# Patient Record
Sex: Female | Born: 1995 | Race: Black or African American | Hispanic: No | Marital: Single | Smoking: Never smoker
Health system: Southern US, Community
[De-identification: ages and names within clinical notes are randomized; demographics above are authoritative.]

## PROBLEM LIST (undated history)

## (undated) DIAGNOSIS — N879 Dysplasia of cervix uteri, unspecified: Secondary | ICD-10-CM

## (undated) DIAGNOSIS — B999 Unspecified infectious disease: Secondary | ICD-10-CM

## (undated) DIAGNOSIS — D649 Anemia, unspecified: Secondary | ICD-10-CM

## (undated) DIAGNOSIS — F53 Postpartum depression: Secondary | ICD-10-CM

## (undated) DIAGNOSIS — A749 Chlamydial infection, unspecified: Secondary | ICD-10-CM

## (undated) HISTORY — DX: Postpartum depression: F53.0

---
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
## 2013-02-11 ENCOUNTER — Ambulatory Visit: Admitting: Obstetrics and Gynecology

## 2013-05-20 ENCOUNTER — Ambulatory Visit: Admitting: Obstetrics & Gynecology

## 2013-05-26 ENCOUNTER — Encounter: Payer: Self-pay | Admitting: Obstetrics & Gynecology

## 2013-05-26 ENCOUNTER — Ambulatory Visit (INDEPENDENT_AMBULATORY_CARE_PROVIDER_SITE_OTHER): Admitting: Obstetrics & Gynecology

## 2013-05-26 VITALS — BP 94/70 | HR 97 | Ht 61.0 in | Wt 140.0 lb

## 2013-05-26 DIAGNOSIS — Z01419 Encounter for gynecological examination (general) (routine) without abnormal findings: Secondary | ICD-10-CM

## 2013-05-26 DIAGNOSIS — Z0000 Encounter for general adult medical examination without abnormal findings: Secondary | ICD-10-CM

## 2013-05-26 LAB — CBC
HEMATOCRIT: 37.5 % (ref 36.0–46.0)
HEMOGLOBIN: 12.1 g/dL (ref 12.0–15.0)
MCH: 23.4 pg — ABNORMAL LOW (ref 26.0–34.0)
MCHC: 32.3 g/dL (ref 30.0–36.0)
MCV: 72.4 fL — ABNORMAL LOW (ref 78.0–100.0)
Platelets: 338 10*3/uL (ref 150–400)
RBC: 5.18 MIL/uL — ABNORMAL HIGH (ref 3.87–5.11)
RDW: 17.7 % — ABNORMAL HIGH (ref 11.5–15.5)
WBC: 7.3 10*3/uL (ref 4.0–10.5)

## 2013-05-26 LAB — TSH: TSH: 1.357 u[IU]/mL (ref 0.350–4.500)

## 2013-05-26 NOTE — Progress Notes (Signed)
Subjective:    Shirley Greene is a 18 y.o. female who presents for an annual exam. The patient has no complaints today. The patient is sexually active. GYN screening history: last pap: patient has never had a pap test. The patient wears seatbelts: yes. The patient participates in regular exercise: no. Has the patient ever been transfused or tattooed?: yes. (tattoo) The patient reports that there is not domestic violence in her life.   Menstrual History: OB History   Grav Para Term Preterm Abortions TAB SAB Ect Mult Living   1 1 1       1       Menarche age: 89  Patient's last menstrual period was 05/16/2013.    The following portions of the patient's history were reviewed and updated as appropriate: allergies, current medications, past family history, past medical history, past social history, past surgical history and problem list.  Review of Systems A comprehensive review of systems was negative. 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 Monogamous for 7 months, denies dyspareunia. She uses Nexplanon. She has a period about every 4-6 months, lasting 3-4 weeks.   Objective:    BP 94/70  Pulse 97  Ht 5\' 1"  (1.549 m)  Wt 140 lb (63.504 kg)  BMI 26.47 kg/m2  LMP 05/16/2013  General Appearance:    Alert, cooperative, no distress, appears stated age  Head:    Normocephalic, without obvious abnormality, atraumatic  Eyes:    PERRL, conjunctiva/corneas clear, EOM's intact, fundi    benign, both eyes  Ears:    Normal TM's and external ear canals, both ears  Nose:   Nares normal, septum midline, mucosa normal, no drainage    or sinus tenderness  Throat:   Lips, mucosa, and tongue normal; teeth and gums normal  Neck:   Supple, symmetrical, trachea midline, no adenopathy;    thyroid:  no enlargement/tenderness/nodules; no carotid   bruit or JVD  Back:     Symmetric, no curvature, ROM normal, no CVA tenderness  Lungs:     Clear to auscultation bilaterally, respirations unlabored   Chest Wall:    No tenderness or deformity   Heart:    Regular rate and rhythm, S1 and S2 normal, no murmur, rub   or gallop  Breast Exam:    No tenderness, masses, or nipple abnormality  Abdomen:     Soft, non-tender, bowel sounds active all four quadrants,    no masses, no organomegaly  Genitalia:    Normal female without lesion, discharge or tenderness, NSSA, NT, mobile, normal adnexal exam     Extremities:   Extremities normal, atraumatic, no cyanosis or edema  Pulses:   2+ and symmetric all extremities  Skin:   Skin color, texture, turgor normal, no rashes or lesions  Lymph nodes:   Cervical, supraclavicular, and axillary nodes normal  Neurologic:   CNII-XII intact, normal strength, sensation and reflexes    throughout  .    Assessment:    Healthy female exam.  H/O anemia   Plan:     Breast self exam technique reviewed and patient encouraged to perform self-exam monthly. Chlamydia specimen. GC specimen.  CBC to follow anemia

## 2013-07-07 ENCOUNTER — Telehealth: Payer: Self-pay | Admitting: *Deleted

## 2013-07-07 DIAGNOSIS — N926 Irregular menstruation, unspecified: Secondary | ICD-10-CM

## 2013-07-07 MED ORDER — NORGESTIMATE-ETH ESTRADIOL 0.25-35 MG-MCG PO TABS: 1.0000 | ORAL_TABLET | Freq: Every day | ORAL | Status: DC

## 2013-07-07 NOTE — Telephone Encounter (Signed)
Patient is having irregular bleeding with the nextplanon and would like to go on the pill for three months as discussed at her last exam to try and get her bleeding in order.  She will do this and call us back at the end.

## 2013-11-17 ENCOUNTER — Encounter: Payer: Self-pay | Admitting: Obstetrics & Gynecology

## 2013-12-18 ENCOUNTER — Other Ambulatory Visit: Payer: Self-pay | Admitting: Family Medicine

## 2013-12-18 ENCOUNTER — Encounter: Payer: Self-pay | Admitting: Family Medicine

## 2013-12-18 ENCOUNTER — Ambulatory Visit (INDEPENDENT_AMBULATORY_CARE_PROVIDER_SITE_OTHER): Admitting: Family Medicine

## 2013-12-18 VITALS — BP 118/62 | HR 79 | Wt 140.0 lb

## 2013-12-18 DIAGNOSIS — Z113 Encounter for screening for infections with a predominantly sexual mode of transmission: Secondary | ICD-10-CM

## 2013-12-18 DIAGNOSIS — N898 Other specified noninflammatory disorders of vagina: Secondary | ICD-10-CM

## 2013-12-18 MED ORDER — METRONIDAZOLE 500 MG PO TABS: 500.0000 mg | ORAL_TABLET | Freq: Two times a day (BID) | ORAL | Status: DC

## 2013-12-18 NOTE — Patient Instructions (Signed)
Bacterial Vaginosis Bacterial vaginosis is a vaginal infection that occurs when the normal balance of bacteria in the vagina is disrupted. It results from an overgrowth of certain bacteria. This is the most common vaginal infection in women of childbearing age. Treatment is important to prevent complications, especially in pregnant women, as it can cause a premature delivery. CAUSES  Bacterial vaginosis is caused by an increase in harmful bacteria that are normally present in smaller amounts in the vagina. Several different kinds of bacteria can cause bacterial vaginosis. However, the reason that the condition develops is not fully understood. RISK FACTORS Certain activities or behaviors can put you at an increased risk of developing bacterial vaginosis, including:  Having a new sex partner or multiple sex partners.  Douching.  Using an intrauterine device (IUD) for contraception. Women do not get bacterial vaginosis from toilet seats, bedding, swimming pools, or contact with objects around them. SIGNS AND SYMPTOMS  Some women with bacterial vaginosis have no signs or symptoms. Common symptoms include:  Grey vaginal discharge.  A fishlike odor with discharge, especially after sexual intercourse.  Itching or burning of the vagina and vulva.  Burning or pain with urination. DIAGNOSIS  Your health care provider will take a medical history and examine the vagina for signs of bacterial vaginosis. A sample of vaginal fluid may be taken. Your health care provider will look at this sample under a microscope to check for bacteria and abnormal cells. A vaginal pH test may also be done.  TREATMENT  Bacterial vaginosis may be treated with antibiotic medicines. These may be given in the form of a pill or a vaginal cream. A second round of antibiotics may be prescribed if the condition comes back after treatment.  HOME CARE INSTRUCTIONS   Only take over-the-counter or prescription medicines as  directed by your health care provider.  If antibiotic medicine was prescribed, take it as directed. Make sure you finish it even if you start to feel better.  Do not have sex until treatment is completed.  Tell all sexual partners that you have a vaginal infection. They should see their health care provider and be treated if they have problems, such as a mild rash or itching.  Practice safe sex by using condoms and only having one sex partner. SEEK MEDICAL CARE IF:   Your symptoms are not improving after 3 days of treatment.  You have increased discharge or pain.  You have a fever. MAKE SURE YOU:   Understand these instructions.  Will watch your condition.  Will get help right away if you are not doing well or get worse. FOR MORE INFORMATION  Centers for Disease Control and Prevention, Division of STD Prevention: www.cdc.gov/std American Sexual Health Association (ASHA): www.ashastd.org  Document Released: 01/02/2005 Document Revised: 10/23/2012 Document Reviewed: 08/14/2012 ExitCare Patient Information 2015 [employer]. This information is not intended to replace advice given to you by your health care provider. Make sure you discuss any questions you have with your health care provider.  

## 2013-12-18 NOTE — Progress Notes (Signed)
    Subjective:    Patient ID: Shirley Greene is a 18 y.o. female presenting with Vaginal Discharge and vaginal odor  on 12/18/2013  HPI: Here today for vaginal discharge which is normal but has noted odor--since last cycle left approximately 1 wk..  Last sexually active 1 month ago.  Has Nexplanon. Has some bleeding. No irritations.   Review of Systems  Constitutional: Negative for fever and chills.  Respiratory: Negative for shortness of breath.   Cardiovascular: Negative for chest pain.  Gastrointestinal: Negative for nausea, vomiting and abdominal pain.  Genitourinary: Positive for vaginal discharge. Negative for dysuria, vaginal pain and menstrual problem.  Skin: Negative for rash.      Objective:    BP 118/62 mmHg  Pulse 79  Wt 140 lb (63.504 kg)  LMP 11/21/2013  Breastfeeding? No Physical Exam  Constitutional: She is oriented to person, place, and time. She appears well-developed and well-nourished. No distress.  HENT:  Head: Normocephalic and atraumatic.  Eyes: No scleral icterus.  Neck: Neck supple.  Cardiovascular: Normal rate.   Pulmonary/Chest: Effort normal.  Abdominal: Soft.  Genitourinary: Uterus is not enlarged and not tender. Cervix exhibits no motion tenderness and no friability. Right adnexum displays no mass and no tenderness. Left adnexum displays no mass and no tenderness. Vaginal discharge (thin, white) found.  Neurological: She is alert and oriented to person, place, and time.  Skin: Skin is warm and dry.  Psychiatric: She has a normal mood and affect.        Assessment & Plan:   Problem List Items Addressed This Visit    None    Visit Diagnoses    Screen for STD (sexually transmitted disease)    -  Primary    Relevant Orders       GC/Chlamydia Probe Amp       HIV antibody       RPR    Vaginal discharge        Relevant Medications       metroNIDAZOLE (FLAGYL) tablet    Other Relevant Orders       Wet prep, genital       GC/Chlamydia  Probe Amp        Return if symptoms worsen or fail to improve, for a follow-up.

## 2013-12-19 LAB — WET PREP, GENITAL
Trich, Wet Prep: NONE SEEN
WBC, Wet Prep HPF POC: NONE SEEN
YEAST WET PREP: NONE SEEN

## 2013-12-19 LAB — GC/CHLAMYDIA PROBE AMP
CT PROBE, AMP APTIMA: NEGATIVE
GC PROBE AMP APTIMA: NEGATIVE

## 2013-12-19 LAB — RPR

## 2013-12-19 LAB — HIV ANTIBODY (ROUTINE TESTING W REFLEX): HIV: NONREACTIVE

## 2013-12-26 ENCOUNTER — Emergency Department (HOSPITAL_COMMUNITY)
Admission: EM | Admit: 2013-12-26 | Discharge: 2013-12-26 | Discharge status: Against Medical Advice | Attending: Emergency Medicine | Admitting: Emergency Medicine

## 2013-12-26 ENCOUNTER — Encounter (HOSPITAL_COMMUNITY): Payer: Self-pay | Admitting: Emergency Medicine

## 2013-12-26 DIAGNOSIS — Y9389 Activity, other specified: Secondary | ICD-10-CM | POA: Diagnosis not present

## 2013-12-26 DIAGNOSIS — T656X1A Toxic effect of paints and dyes, not elsewhere classified, accidental (unintentional), initial encounter: Secondary | ICD-10-CM | POA: Diagnosis not present

## 2013-12-26 DIAGNOSIS — Y998 Other external cause status: Secondary | ICD-10-CM | POA: Insufficient documentation

## 2013-12-26 DIAGNOSIS — L299 Pruritus, unspecified: Secondary | ICD-10-CM | POA: Diagnosis not present

## 2013-12-26 DIAGNOSIS — Y9289 Other specified places as the place of occurrence of the external cause: Secondary | ICD-10-CM | POA: Diagnosis not present

## 2013-12-26 DIAGNOSIS — T7840XA Allergy, unspecified, initial encounter: Secondary | ICD-10-CM | POA: Diagnosis present

## 2013-12-26 NOTE — ED Notes (Signed)
Pt. reports allergic reaction to hair dye yesterday with scalp and face itching , no redness or swelling / airway intact , respirations unlabored .

## 2014-01-30 ENCOUNTER — Inpatient Hospital Stay (HOSPITAL_COMMUNITY)
Admission: AD | Admit: 2014-01-30 | Discharge: 2014-01-30 | Disposition: A | Discharge status: Home / Self Care | Source: Ambulatory Visit | Attending: Obstetrics & Gynecology | Admitting: Obstetrics & Gynecology

## 2014-01-30 ENCOUNTER — Encounter (HOSPITAL_COMMUNITY): Payer: Self-pay | Admitting: General Practice

## 2014-01-30 DIAGNOSIS — B9689 Other specified bacterial agents as the cause of diseases classified elsewhere: Secondary | ICD-10-CM

## 2014-01-30 DIAGNOSIS — R319 Hematuria, unspecified: Secondary | ICD-10-CM | POA: Insufficient documentation

## 2014-01-30 DIAGNOSIS — N390 Urinary tract infection, site not specified: Secondary | ICD-10-CM | POA: Diagnosis not present

## 2014-01-30 DIAGNOSIS — N939 Abnormal uterine and vaginal bleeding, unspecified: Secondary | ICD-10-CM | POA: Insufficient documentation

## 2014-01-30 DIAGNOSIS — N760 Acute vaginitis: Secondary | ICD-10-CM | POA: Insufficient documentation

## 2014-01-30 LAB — URINALYSIS, ROUTINE W REFLEX MICROSCOPIC
BILIRUBIN URINE: NEGATIVE
GLUCOSE, UA: NEGATIVE mg/dL
Ketones, ur: 15 mg/dL — AB
Leukocytes, UA: NEGATIVE
NITRITE: POSITIVE — AB
PH: 6 (ref 5.0–8.0)
PROTEIN: NEGATIVE mg/dL
SPECIFIC GRAVITY, URINE: 1.025 (ref 1.005–1.030)
Urobilinogen, UA: 0.2 mg/dL (ref 0.0–1.0)

## 2014-01-30 LAB — CBC
HEMATOCRIT: 41.4 % (ref 36.0–46.0)
HEMOGLOBIN: 13.3 g/dL (ref 12.0–15.0)
MCH: 25 pg — ABNORMAL LOW (ref 26.0–34.0)
MCHC: 32.1 g/dL (ref 30.0–36.0)
MCV: 77.7 fL — ABNORMAL LOW (ref 78.0–100.0)
PLATELETS: 315 10*3/uL (ref 150–400)
RBC: 5.33 MIL/uL — AB (ref 3.87–5.11)
RDW: 14.4 % (ref 11.5–15.5)
WBC: 6.9 10*3/uL (ref 4.0–10.5)

## 2014-01-30 LAB — WET PREP, GENITAL
Trich, Wet Prep: NONE SEEN
YEAST WET PREP: NONE SEEN

## 2014-01-30 LAB — URINE MICROSCOPIC-ADD ON

## 2014-01-30 LAB — POCT PREGNANCY, URINE: PREG TEST UR: NEGATIVE

## 2014-01-30 MED ORDER — METRONIDAZOLE 500 MG PO TABS: 500.0000 mg | ORAL_TABLET | Freq: Two times a day (BID) | ORAL | Status: DC

## 2014-01-30 MED ORDER — CIPROFLOXACIN HCL 500 MG PO TABS: 500.0000 mg | ORAL_TABLET | Freq: Two times a day (BID) | ORAL | Status: DC

## 2014-01-30 NOTE — Discharge Instructions (Signed)
Bacterial Vaginosis Bacterial vaginosis is a vaginal infection that occurs when the normal balance of bacteria in the vagina is disrupted. It results from an overgrowth of certain bacteria. This is the most common vaginal infection in women of childbearing age. Treatment is important to prevent complications, especially in pregnant women, as it can cause a premature delivery. CAUSES  Bacterial vaginosis is caused by an increase in harmful bacteria that are normally present in smaller amounts in the vagina. Several different kinds of bacteria can cause bacterial vaginosis. However, the reason that the condition develops is not fully understood. RISK FACTORS Certain activities or behaviors can put you at an increased risk of developing bacterial vaginosis, including:  Having a new sex partner or multiple sex partners.  Douching.  Using an intrauterine device (IUD) for contraception. Women do not get bacterial vaginosis from toilet seats, bedding, swimming pools, or contact with objects around them. SIGNS AND SYMPTOMS  Some women with bacterial vaginosis have no signs or symptoms. Common symptoms include:  Grey vaginal discharge.  A fishlike odor with discharge, especially after sexual intercourse.  Itching or burning of the vagina and vulva.  Burning or pain with urination. DIAGNOSIS  Your health care provider will take a medical history and examine the vagina for signs of bacterial vaginosis. A sample of vaginal fluid may be taken. Your health care provider will look at this sample under a microscope to check for bacteria and abnormal cells. A vaginal pH test may also be done.  TREATMENT  Bacterial vaginosis may be treated with antibiotic medicines. These may be given in the form of a pill or a vaginal cream. A second round of antibiotics may be prescribed if the condition comes back after treatment.  HOME CARE INSTRUCTIONS   Only take over-the-counter or prescription medicines as  directed by your health care provider.  If antibiotic medicine was prescribed, take it as directed. Make sure you finish it even if you start to feel better.  Do not have sex until treatment is completed.  Tell all sexual partners that you have a vaginal infection. They should see their health care provider and be treated if they have problems, such as a mild rash or itching.  Practice safe sex by using condoms and only having one sex partner. SEEK MEDICAL CARE IF:   Your symptoms are not improving after 3 days of treatment.  You have increased discharge or pain.  You have a fever. MAKE SURE YOU:   Understand these instructions.  Will watch your condition.  Will get help right away if you are not doing well or get worse. FOR MORE INFORMATION  Centers for Disease Control and Prevention, Division of STD Prevention: www.cdc.gov/std American Sexual Health Association (ASHA): www.ashastd.org  Document Released: 01/02/2005 Document Revised: 10/23/2012 Document Reviewed: 08/14/2012 ExitCare Patient Information 2015 Exit[employer]. This information is not intended to replace advice given to you by your health care provider. Make sure you discuss any questions you have with your health care provider.  

## 2014-01-30 NOTE — MAU Note (Signed)
I have had vaginal bleeding for 3wks. Have Nexplanon which was place May 2015. My periods have been more frequent like 2/mo and lasting longer but this is longest. Using 4 pads daily. Mild cramping

## 2014-01-30 NOTE — MAU Provider Note (Signed)
History     CSN: [id number]  Arrival date and time: 01/30/14 1859   First Provider Initiated Contact with Patient 01/30/14 2003      Chief Complaint  Patient presents with  . Vaginal Bleeding   HPI Shirley Greene 18 y.o. G1P1001 nonpregnant female presents to MAU complaining of irregular bleeding with Nexplanon in place.  She has bleeding daily x 3 weeks using 4 pads/day.  She is having intermittent cramping 4/10 and feeling more emotional.  She denies generalized abdominal pain, weakness, dizziness, nausea, vomiting,dysuria.   OB History    Gravida Para Term Preterm AB TAB SAB Ectopic Multiple Living   1 1 1       1       Past Medical History  Diagnosis Date  . Anemia     Past Surgical History  Procedure Laterality Date  . Cesarean section N/A 04/05/2012    Procedure: CESAREAN SECTION;  Surgeon: Tilda BurrowJohn V Ferguson, MD;  Location: [facility];  Service: Obstetrics;  Laterality: N/A;    History reviewed. No pertinent family history.  History  Substance Use Topics  . Smoking status: Never Smoker   . Smokeless tobacco: Never Used  . Alcohol Use: No    Allergies: No Known Allergies  Prescriptions prior to admission  Medication Sig Dispense Refill Last Dose  . metroNIDAZOLE (FLAGYL) 500 MG tablet Take 1 tablet (500 mg total) by mouth 2 (two) times daily. (Patient not taking: Reported on 01/30/2014) 14 tablet 0     ROS Pertinent ROS in HPI  Physical Exam   Blood pressure 112/62, pulse 88, temperature 98.8 F (37.1 C), resp. rate 18, height 5' (1.524 m), weight 140 lb 12.8 oz (63.866 kg), last menstrual period 01/08/2014, SpO2 100 %, not currently breastfeeding.  Physical Exam  Constitutional: She is oriented to person, place, and time. She appears well-developed and well-nourished. No distress.  HENT:  Head: Normocephalic and atraumatic.  Eyes: EOM are normal.  Neck: Normal range of motion.  Cardiovascular: Normal rate and regular rhythm.   Respiratory: Effort normal and  breath sounds normal. No respiratory distress.  GI: Soft. Bowel sounds are normal. She exhibits no distension. There is no tenderness. There is no rebound.  Genitourinary:  Mod amt of dark red blood in vaginal vault.  Malodorous Cervix is closed.  No CMT.  No adnexal mass or tenderness.  Musculoskeletal: Normal range of motion. She exhibits no edema.  Neurological: She is alert and oriented to person, place, and time.  Skin: Skin is warm and dry.  Psychiatric: She has a normal mood and affect.   Results for orders placed or performed during the hospital encounter of 01/30/14 (from the past 24 hour(s))  Urinalysis, Routine w reflex microscopic     Status: Abnormal   Collection Time: 01/30/14  7:40 PM  Result Value Ref Range   Color, Urine YELLOW YELLOW   APPearance CLEAR CLEAR   Specific Gravity, Urine 1.025 1.005 - 1.030   pH 6.0 5.0 - 8.0   Glucose, UA NEGATIVE NEGATIVE mg/dL   Hgb urine dipstick LARGE (A) NEGATIVE   Bilirubin Urine NEGATIVE NEGATIVE   Ketones, ur 15 (A) NEGATIVE mg/dL   Protein, ur NEGATIVE NEGATIVE mg/dL   Urobilinogen, UA 0.2 0.0 - 1.0 mg/dL   Nitrite POSITIVE (A) NEGATIVE   Leukocytes, UA NEGATIVE NEGATIVE  Urine microscopic-add on     Status: Abnormal   Collection Time: 01/30/14  7:40 PM  Result Value Ref Range   Squamous Epithelial / LPF  FEW (A) RARE   WBC, UA 3-6 <3 WBC/hpf   RBC / HPF 7-10 <3 RBC/hpf   Bacteria, UA MANY (A) RARE  Pregnancy, urine POC     Status: None   Collection Time: 01/30/14  7:46 PM  Result Value Ref Range   Preg Test, Ur NEGATIVE NEGATIVE  Wet prep, genital     Status: Abnormal   Collection Time: 01/30/14  8:15 PM  Result Value Ref Range   Yeast Wet Prep HPF POC NONE SEEN NONE SEEN   Trich, Wet Prep NONE SEEN NONE SEEN   Clue Cells Wet Prep HPF POC FEW (A) NONE SEEN   WBC, Wet Prep HPF POC FEW (A) NONE SEEN  CBC     Status: Abnormal   Collection Time: 01/30/14  8:15 PM  Result Value Ref Range   WBC 6.9 4.0 - 10.5 K/uL    RBC 5.33 (H) 3.87 - 5.11 MIL/uL   Hemoglobin 13.3 12.0 - 15.0 g/dL   HCT 29.5 62.1 - 30.8 %   MCV 77.7 (L) 78.0 - 100.0 fL   MCH 25.0 (L) 26.0 - 34.0 pg   MCHC 32.1 30.0 - 36.0 g/dL   RDW 65.7 84.6 - 96.2 %   Platelets 315 150 - 400 K/uL    MAU Course  Procedures  MDM U/A suggests UTI Wet prep and exam are consistent with BV Pt is hemodynamically stable  Assessment and Plan  A:  1. Urinary tract infection with hematuria, site unspecified   2. Bacterial vaginosis   3. Abnormal uterine bleeding    P: Discharge to home Cipro bid x 5 days Flagyl bid x 7 days No etoh/IC x 10 days Pt informed of the possibility of undetected trichomonal infection.  She and/or partner may go to HD for additional eval/tx.   Follow up at [facility] for further bleeding problems Patient may return to MAU as needed or if her condition were to change or worsen   Bertram Denver 01/30/2014, 8:05 PM

## 2014-02-02 LAB — RPR: RPR: NONREACTIVE

## 2014-02-02 LAB — HIV ANTIBODY (ROUTINE TESTING W REFLEX): HIV-1/HIV-2 Ab: NONREACTIVE

## 2014-02-03 LAB — GC/CHLAMYDIA PROBE AMP (~~LOC~~) NOT AT ARMC
Chlamydia: NEGATIVE
NEISSERIA GONORRHEA: NEGATIVE

## 2014-02-04 ENCOUNTER — Ambulatory Visit (INDEPENDENT_AMBULATORY_CARE_PROVIDER_SITE_OTHER): Admitting: Obstetrics and Gynecology

## 2014-02-04 ENCOUNTER — Encounter: Payer: Self-pay | Admitting: Obstetrics and Gynecology

## 2014-02-04 VITALS — BP 93/60 | HR 102 | Wt 140.0 lb

## 2014-02-04 DIAGNOSIS — Z30018 Encounter for initial prescription of other contraceptives: Secondary | ICD-10-CM

## 2014-02-04 DIAGNOSIS — Z3046 Encounter for surveillance of implantable subdermal contraceptive: Secondary | ICD-10-CM

## 2014-02-04 DIAGNOSIS — Z308 Encounter for other contraceptive management: Secondary | ICD-10-CM

## 2014-02-04 NOTE — Progress Notes (Signed)
Patient ID: Shirley Greene, female   DOB: [birth year], 19 y.o.   MRN: [id number] History 19 yo G1P1001 presents for Nexplanon removal. Device inserted here 05/2012 and initially had bleeding only q 3 months. She has had worsening unexpected light bleeding over the past year. She has bled daily since Christmas eve. About 6 months ago, she was given a trial of combination OCPs which she took for 3 months with no improvement. She denies pelvic pain or vaginal discharge. Was dx with BV at 01/30/14 MAU visit treated with Flagyl. No new sex partner. Desires to go back on OrthoEvra.   Physical exam Filed Vitals:   02/04/14 1525  BP: 93/60  Pulse: 102  Gen: WN, WD in NAD Abd: soft NT  Procedure Note Patient given informed consent for removal of her Implanon, time out was performed.  Signed copy in the chart.  Appropriate time out taken. Implanon site identified.  Area prepped in usual sterile fashon. One cc of 1% lidocaine was used to anesthetize the area at the distal end of the implant. A small stab incision was made right beside the implant on the distal portion.  The implanon rod was grasped using hemostats and removed without difficulty.  There was less than 3 cc blood loss. There were no complications.  A small amount of antibiotic ointment and steri-strips were applied over the small incision.  A pressure bandage was applied to reduce any bruising.  The patient tolerated the procedure well and was given post procedure instructions.  Assessment Visit for Nexplanon removal  Plan Rx OrthoEvra Use back up method or abstain for first month.  Return 6 months for Annual GYN visit. Shirley Greene, CNM 02/04/2014 4:06 PM

## 2014-02-09 MED ORDER — NORELGESTROMIN-ETH ESTRADIOL 150-35 MCG/24HR TD PTWK: 1.0000 | MEDICATED_PATCH | TRANSDERMAL | Status: DC

## 2014-02-09 NOTE — Addendum Note (Signed)
Addended by: Barbara CowerNOGUES, Hasson Gaspard L on: 02/09/2014 01:39 PM   Modules accepted: Orders

## 2014-09-30 ENCOUNTER — Ambulatory Visit (INDEPENDENT_AMBULATORY_CARE_PROVIDER_SITE_OTHER): Payer: Self-pay | Admitting: Certified Nurse Midwife

## 2014-09-30 ENCOUNTER — Encounter: Payer: Self-pay | Admitting: Certified Nurse Midwife

## 2014-09-30 VITALS — BP 103/68 | HR 94 | Wt 143.0 lb

## 2014-09-30 DIAGNOSIS — N760 Acute vaginitis: Secondary | ICD-10-CM

## 2014-09-30 NOTE — Progress Notes (Signed)
SUBJECTIVE:  19 y.o. female complains of clear and white vaginal discharge for 2 days . Denies abnormal vaginal bleeding or significant pelvic pain or fever. No UTI symptoms. Denies history of known exposure to STD.  Patient's last menstrual period was 09/21/2014.  OBJECTIVE:  She appears well, afebrile. Abdomen: benign, soft, nontender, no masses. Pelvic Exam: normal external genitalia, vulva, vagina, cervix, uterus and adnexa. Urine dipstick: not done.  ASSESSMENT:  nonspecific vaginitis  PLAN:   Treatment: abstain from coitus during course of treatment and will send wet prep off and call pt in appropriate medicine ROV prn if symptoms persist or worsen.

## 2014-09-30 NOTE — Patient Instructions (Signed)
Bacterial Vaginosis Bacterial vaginosis is a vaginal infection that occurs when the normal balance of bacteria in the vagina is disrupted. It results from an overgrowth of certain bacteria. This is the most common vaginal infection in women of childbearing age. Treatment is important to prevent complications, especially in pregnant women, as it can cause a premature delivery. CAUSES  Bacterial vaginosis is caused by an increase in harmful bacteria that are normally present in smaller amounts in the vagina. Several different kinds of bacteria can cause bacterial vaginosis. However, the reason that the condition develops is not fully understood. RISK FACTORS Certain activities or behaviors can put you at an increased risk of developing bacterial vaginosis, including:  Having a new sex partner or multiple sex partners.  Douching.  Using an intrauterine device (IUD) for contraception. Women do not get bacterial vaginosis from toilet seats, bedding, swimming pools, or contact with objects around them. SIGNS AND SYMPTOMS  Some women with bacterial vaginosis have no signs or symptoms. Common symptoms include:  Grey vaginal discharge.  A fishlike odor with discharge, especially after sexual intercourse.  Itching or burning of the vagina and vulva.  Burning or pain with urination. DIAGNOSIS  Your health care provider will take a medical history and examine the vagina for signs of bacterial vaginosis. A sample of vaginal fluid may be taken. Your health care provider will look at this sample under a microscope to check for bacteria and abnormal cells. A vaginal pH test may also be done.  TREATMENT  Bacterial vaginosis may be treated with antibiotic medicines. These may be given in the form of a pill or a vaginal cream. A second round of antibiotics may be prescribed if the condition comes back after treatment.  HOME CARE INSTRUCTIONS   Only take over-the-counter or prescription medicines as  directed by your health care provider.  If antibiotic medicine was prescribed, take it as directed. Make sure you finish it even if you start to feel better.  Do not have sex until treatment is completed.  Tell all sexual partners that you have a vaginal infection. They should see their health care provider and be treated if they have problems, such as a mild rash or itching.  Practice safe sex by using condoms and only having one sex partner. SEEK MEDICAL CARE IF:   Your symptoms are not improving after 3 days of treatment.  You have increased discharge or pain.  You have a fever. MAKE SURE YOU:   Understand these instructions.  Will watch your condition.  Will get help right away if you are not doing well or get worse. FOR MORE INFORMATION  Centers for Disease Control and Prevention, Division of STD Prevention: www.cdc.gov/std American Sexual Health Association (ASHA): www.ashastd.org  Document Released: 01/02/2005 Document Revised: 10/23/2012 Document Reviewed: 08/14/2012 ExitCare Patient Information 2015 [employer]. This information is not intended to replace advice given to you by your health care provider. Make sure you discuss any questions you have with your health care provider.  

## 2014-09-30 NOTE — Addendum Note (Signed)
Addended by: Illene Bolus A on: 09/30/2014 03:45 PM   Modules accepted: Orders

## 2014-10-01 LAB — WET PREP, GENITAL
Trich, Wet Prep: NONE SEEN
WBC, Wet Prep HPF POC: NONE SEEN
Yeast Wet Prep HPF POC: NONE SEEN

## 2014-10-05 ENCOUNTER — Other Ambulatory Visit: Payer: Self-pay

## 2014-10-07 ENCOUNTER — Other Ambulatory Visit: Payer: Self-pay | Admitting: *Deleted

## 2014-10-07 DIAGNOSIS — N898 Other specified noninflammatory disorders of vagina: Secondary | ICD-10-CM

## 2014-10-07 NOTE — Progress Notes (Signed)
Pt had wet prep done 09-30-14, WDL, pt requesting additional testing for GC/CT.

## 2014-10-08 ENCOUNTER — Telehealth (HOSPITAL_COMMUNITY): Payer: Self-pay | Admitting: *Deleted

## 2014-10-08 DIAGNOSIS — A749 Chlamydial infection, unspecified: Secondary | ICD-10-CM

## 2014-10-08 LAB — GC/CHLAMYDIA PROBE AMP
CT PROBE, AMP APTIMA: POSITIVE — AB
GC Probe RNA: NEGATIVE

## 2014-10-08 MED ORDER — AZITHROMYCIN 500 MG PO TABS: ORAL_TABLET | ORAL | Status: DC

## 2014-10-08 NOTE — Telephone Encounter (Signed)
Patient returned call, notified her of positive chlamydia culture.  Rx routed to patient's pharmacy per protocol.  Instructed patient to notify her partner for treatment and to abstain from sex for seven days post treatment.

## 2014-11-04 ENCOUNTER — Encounter: Payer: Self-pay | Admitting: Obstetrics & Gynecology

## 2014-11-04 ENCOUNTER — Ambulatory Visit (INDEPENDENT_AMBULATORY_CARE_PROVIDER_SITE_OTHER): Payer: Medicaid Other | Admitting: Obstetrics & Gynecology

## 2014-11-04 ENCOUNTER — Other Ambulatory Visit (HOSPITAL_COMMUNITY)
Admission: RE | Admit: 2014-11-04 | Discharge: 2014-11-04 | Disposition: A | Discharge status: Home / Self Care | Payer: Medicaid Other | Source: Ambulatory Visit | Attending: Obstetrics & Gynecology | Admitting: Obstetrics & Gynecology

## 2014-11-04 VITALS — BP 108/73 | HR 94 | Wt 144.0 lb

## 2014-11-04 DIAGNOSIS — Z01419 Encounter for gynecological examination (general) (routine) without abnormal findings: Secondary | ICD-10-CM

## 2014-11-04 DIAGNOSIS — Z3041 Encounter for surveillance of contraceptive pills: Secondary | ICD-10-CM

## 2014-11-04 DIAGNOSIS — Z3009 Encounter for other general counseling and advice on contraception: Secondary | ICD-10-CM

## 2014-11-04 DIAGNOSIS — Z113 Encounter for screening for infections with a predominantly sexual mode of transmission: Secondary | ICD-10-CM | POA: Diagnosis present

## 2014-11-04 DIAGNOSIS — Z8619 Personal history of other infectious and parasitic diseases: Secondary | ICD-10-CM

## 2014-11-04 MED ORDER — LEVONORGESTREL-ETHINYL ESTRAD 0.1-20 MG-MCG PO TABS: 1.0000 | ORAL_TABLET | Freq: Every day | ORAL | Status: DC

## 2014-11-04 NOTE — Progress Notes (Signed)
Patient ID: Shirley Greene, female   DOB: 10-22-1995, 19 y.o.   MRN: [id number]  Chief Complaint  Patient presents with  . Gynecologic Exam  cramps and menses last 7 days  HPI Shirley LunchDeonna Greene is a 19 y.o. female.  G1P1001 Patient's last menstrual period was 10/17/2014. Regular menses on Ortho Evra but wants to see if period will be lighter and less painful on an OCP.   Recent treatment for CT, states partner treated HPI  Past Medical History  Diagnosis Date  . Anemia     Past Surgical History  Procedure Laterality Date  . Cesarean section N/A 04/05/2012    Procedure: CESAREAN SECTION;  Surgeon: Tilda BurrowJohn V Ferguson, MD;  Location: [facility];  Service: Obstetrics;  Laterality: N/A;    History reviewed. No pertinent family history.  Social History Social History  Substance Use Topics  . Smoking status: Never Smoker   . Smokeless tobacco: Never Used  . Alcohol Use: No    No Known Allergies  Current Outpatient Prescriptions  Medication Sig Dispense Refill  . levonorgestrel-ethinyl estradiol (AVIANE,ALESSE,LESSINA) 0.1-20 MG-MCG tablet Take 1 tablet by mouth daily. 1 Package 11   No current facility-administered medications for this visit.    Review of Systems Review of Systems  Constitutional: Negative.   Respiratory: Negative.   Gastrointestinal: Negative.   Genitourinary: Positive for menstrual problem. Negative for dysuria, vaginal discharge, vaginal pain and dyspareunia.    Blood pressure 108/73, pulse 94, weight 144 lb (65.318 kg), last menstrual period 10/17/2014.  Physical Exam Physical Exam  Constitutional: She appears well-developed. No distress.  Neck: Normal range of motion. No thyromegaly present.  Cardiovascular: Normal rate.   Pulmonary/Chest: Effort normal and breath sounds normal.  Breasts: breasts appear normal, no suspicious masses, no skin or nipple changes or axillary nodes.   Abdominal: Soft. She exhibits no distension. There is no tenderness.   Genitourinary: Vagina normal and uterus normal. No vaginal discharge found.  Pelvic exam: normal external genitalia, vulva, vagina, cervix, uterus and adnexa, pap done and TOC CT.   Neurological: She is alert.  Skin: Skin is warm and dry.  Psychiatric: She has a normal mood and affect. Her behavior is normal.    Data Reviewed STD test result  Assessment    Patient Active Problem List   Diagnosis Date Noted  . 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 Status post primary low transverse cesarean section 04/10/2012   Wants to change BCM, Rx OCP   Heavy period and dysmenorrhea  Plan    Switch to Alesse Pap sent f/u result TOC STD sent f/u result         Shirley Greene 11/04/2014, 10:23 AM

## 2014-11-06 LAB — CYTOLOGY - PAP

## 2014-11-30 ENCOUNTER — Ambulatory Visit: Payer: Medicaid Other | Admitting: Obstetrics & Gynecology

## 2015-01-05 ENCOUNTER — Telehealth: Payer: Self-pay

## 2015-01-05 NOTE — Telephone Encounter (Signed)
Called office earlier today and left message asking for someone to return her call, didn't specify what she needed. Returned phone call, no answer, left message instructing her to return my call here at the office tomorrow 01/06/2015 between the hours of 8-5

## 2015-01-06 ENCOUNTER — Other Ambulatory Visit (HOSPITAL_COMMUNITY)
Admission: RE | Admit: 2015-01-06 | Discharge: 2015-01-06 | Disposition: A | Discharge status: Home / Self Care | Payer: Medicaid Other | Source: Ambulatory Visit | Attending: Certified Nurse Midwife | Admitting: Certified Nurse Midwife

## 2015-01-06 ENCOUNTER — Encounter: Payer: Self-pay | Admitting: Certified Nurse Midwife

## 2015-01-06 ENCOUNTER — Ambulatory Visit (INDEPENDENT_AMBULATORY_CARE_PROVIDER_SITE_OTHER): Payer: Medicaid Other | Admitting: Certified Nurse Midwife

## 2015-01-06 VITALS — BP 110/74 | HR 99 | Resp 18 | Ht 60.0 in | Wt 142.0 lb

## 2015-01-06 DIAGNOSIS — Z113 Encounter for screening for infections with a predominantly sexual mode of transmission: Secondary | ICD-10-CM | POA: Insufficient documentation

## 2015-01-06 DIAGNOSIS — N898 Other specified noninflammatory disorders of vagina: Secondary | ICD-10-CM

## 2015-01-06 NOTE — Progress Notes (Unsigned)
Patient ID: Shirley Greene, female   DOB: 1995/12/30, 19 y.o.   MRN: [id number] CC: Vaginal Discharge    None     HPI Shirley Greene is a 19 y.o. G1P1001 Unknown who presents with onset  LMP ***. Denies abnormal bleeding.  Past Medical History  Diagnosis Date  . Anemia     OB History  Gravida Para Term Preterm AB SAB TAB Ectopic Multiple Living  1 1 1       1     # Outcome Date GA Lbr Len/2nd Weight Sex Delivery Anes PTL Lv  1 Term 04/05/12 [id number]  7 lb 14.8 oz (3.595 kg) M CS-LTranv EPI  Y      Past Surgical History  Procedure Laterality Date  . Cesarean section N/A 04/05/2012    Procedure: CESAREAN SECTION;  Surgeon: Tilda BurrowJohn V Ferguson, MD;  Location: [facility];  Service: Obstetrics;  Laterality: N/A;    Social History   Social History  . Marital Status: Single    Spouse Name: N/A  . Number of Children: N/A  . Years of Education: N/A   Occupational History  . Not on file.   Social History Main Topics  . Smoking status: Never Smoker   . Smokeless tobacco: Never Used  . Alcohol Use: No  . Drug Use: No  . Sexual Activity: Yes    Birth Control/ Protection: Pill   Other Topics Concern  . Not on file   Social History Narrative    Current Outpatient Prescriptions on File Prior to Visit  Medication Sig Dispense Refill  . levonorgestrel-ethinyl estradiol (AVIANE,ALESSE,LESSINA) 0.1-20 MG-MCG tablet Take 1 tablet by mouth daily. 1 Package 11   No current facility-administered medications on file prior to visit.    No Known Allergies   ROS Pertinent items in HPI   PHYSICAL EXAM Filed Vitals:   01/06/15 1532  BP: 110/74  Pulse: 99  Resp: 18   General: Well nourished, well developed female in no acute distress Cardiovascular: Normal rate Respiratory: Normal effort Abdomen: Soft, nontender Back: No CVAT Extremities: No edema Neurologic:grossly intact Speculum exam: NEFG; vagina with physiologic discharge, no blood; cervix clean Bimanual exam: cervix  closed, no CMT; uterus NSSP; no adnexal tenderness or masses   LAB RESULTS No results found for this or any previous visit (from the past 24 hour(s)).  IMAGING No results found.  MAU COURSE    ASSESSMENT  No diagnosis found.  PLAN Discharge home. See AVS for patient education.    Medication List       This list is accurate as of: 01/06/15  3:45 PM.  Always use your most recent med list.               levonorgestrel-ethinyl estradiol 0.1-20 MG-MCG tablet  Commonly known as:  AVIANE,ALESSE,LESSINA  Take 1 tablet by mouth daily.          Rhea PinkLori A Clemmons, CNM 01/06/2015 3:45 PM

## 2015-01-07 LAB — WET PREP, GENITAL
Trich, Wet Prep: NONE SEEN
Yeast Wet Prep HPF POC: NONE SEEN

## 2015-01-08 ENCOUNTER — Telehealth: Payer: Self-pay | Admitting: *Deleted

## 2015-01-08 DIAGNOSIS — B9689 Other specified bacterial agents as the cause of diseases classified elsewhere: Secondary | ICD-10-CM

## 2015-01-08 DIAGNOSIS — N760 Acute vaginitis: Principal | ICD-10-CM

## 2015-01-08 LAB — GC/CHLAMYDIA PROBE AMP (~~LOC~~) NOT AT ARMC
Chlamydia: NEGATIVE
Neisseria Gonorrhea: NEGATIVE

## 2015-01-08 MED ORDER — METRONIDAZOLE 0.75 % VA GEL: 1.0000 | Freq: Two times a day (BID) | VAGINAL | Status: DC

## 2015-01-08 NOTE — Telephone Encounter (Signed)
Patient called and wanted to know lab results.  Wet prep showed few clue cells and patient is still having discharge with an odor.  I will send in Metrogel to patients pharmacy.  Patient requested a gel and not a pill.   Patient aware.

## 2015-03-23 ENCOUNTER — Encounter (HOSPITAL_COMMUNITY): Payer: Self-pay | Admitting: Cardiology

## 2015-03-23 ENCOUNTER — Emergency Department (HOSPITAL_COMMUNITY)
Admission: EM | Admit: 2015-03-23 | Discharge: 2015-03-23 | Disposition: A | Discharge status: Home / Self Care | Payer: Medicaid Other | Attending: Physician Assistant | Admitting: Physician Assistant

## 2015-03-23 DIAGNOSIS — Z862 Personal history of diseases of the blood and blood-forming organs and certain disorders involving the immune mechanism: Secondary | ICD-10-CM | POA: Insufficient documentation

## 2015-03-23 DIAGNOSIS — Z3202 Encounter for pregnancy test, result negative: Secondary | ICD-10-CM | POA: Diagnosis not present

## 2015-03-23 DIAGNOSIS — Z793 Long term (current) use of hormonal contraceptives: Secondary | ICD-10-CM | POA: Diagnosis not present

## 2015-03-23 DIAGNOSIS — R6883 Chills (without fever): Secondary | ICD-10-CM | POA: Insufficient documentation

## 2015-03-23 DIAGNOSIS — R110 Nausea: Secondary | ICD-10-CM | POA: Insufficient documentation

## 2015-03-23 DIAGNOSIS — Z792 Long term (current) use of antibiotics: Secondary | ICD-10-CM | POA: Diagnosis not present

## 2015-03-23 DIAGNOSIS — R109 Unspecified abdominal pain: Secondary | ICD-10-CM | POA: Diagnosis not present

## 2015-03-23 DIAGNOSIS — N898 Other specified noninflammatory disorders of vagina: Secondary | ICD-10-CM | POA: Diagnosis not present

## 2015-03-23 DIAGNOSIS — R1030 Lower abdominal pain, unspecified: Secondary | ICD-10-CM | POA: Diagnosis present

## 2015-03-23 LAB — URINALYSIS, ROUTINE W REFLEX MICROSCOPIC
Bilirubin Urine: NEGATIVE
GLUCOSE, UA: NEGATIVE mg/dL
Ketones, ur: NEGATIVE mg/dL
LEUKOCYTES UA: NEGATIVE
Nitrite: NEGATIVE
PH: 6 (ref 5.0–8.0)
Protein, ur: NEGATIVE mg/dL
Specific Gravity, Urine: 1.024 (ref 1.005–1.030)

## 2015-03-23 LAB — URINE MICROSCOPIC-ADD ON

## 2015-03-23 LAB — COMPREHENSIVE METABOLIC PANEL
ALT: 13 U/L — AB (ref 14–54)
AST: 17 U/L (ref 15–41)
Albumin: 4 g/dL (ref 3.5–5.0)
Alkaline Phosphatase: 51 U/L (ref 38–126)
Anion gap: 11 (ref 5–15)
BUN: 10 mg/dL (ref 6–20)
CHLORIDE: 108 mmol/L (ref 101–111)
CO2: 22 mmol/L (ref 22–32)
CREATININE: 0.82 mg/dL (ref 0.44–1.00)
Calcium: 9.4 mg/dL (ref 8.9–10.3)
Glucose, Bld: 85 mg/dL (ref 65–99)
POTASSIUM: 3.8 mmol/L (ref 3.5–5.1)
SODIUM: 141 mmol/L (ref 135–145)
Total Bilirubin: 0.9 mg/dL (ref 0.3–1.2)
Total Protein: 7.4 g/dL (ref 6.5–8.1)

## 2015-03-23 LAB — I-STAT BETA HCG BLOOD, ED (MC, WL, AP ONLY): I-stat hCG, quantitative: <5 m[IU]/mL (ref <5)

## 2015-03-23 LAB — WET PREP, GENITAL
SPERM: NONE SEEN
TRICH WET PREP: NONE SEEN
YEAST WET PREP: NONE SEEN

## 2015-03-23 LAB — CBC
HEMATOCRIT: 41.9 % (ref 36.0–46.0)
Hemoglobin: 13.2 g/dL (ref 12.0–15.0)
MCH: 24.2 pg — AB (ref 26.0–34.0)
MCHC: 31.5 g/dL (ref 30.0–36.0)
MCV: 76.7 fL — AB (ref 78.0–100.0)
Platelets: 311 10*3/uL (ref 150–400)
RBC: 5.46 MIL/uL — AB (ref 3.87–5.11)
RDW: 15.2 % (ref 11.5–15.5)
WBC: 6.8 10*3/uL (ref 4.0–10.5)

## 2015-03-23 LAB — LIPASE, BLOOD: LIPASE: 24 U/L (ref 11–51)

## 2015-03-23 MED ORDER — ONDANSETRON HCL 4 MG PO TABS: 4.0000 mg | ORAL_TABLET | Freq: Four times a day (QID) | ORAL | Status: DC

## 2015-03-23 MED ORDER — FLUCONAZOLE 200 MG PO TABS
200.0000 mg | ORAL_TABLET | Freq: Every day | ORAL | Status: AC
Start: 2015-03-23 — End: 2015-03-30

## 2015-03-23 MED ORDER — DICYCLOMINE HCL 10 MG PO CAPS
10.0000 mg | ORAL_CAPSULE | Freq: Once | ORAL | Status: AC
  Administered 2015-03-23: 10 mg via ORAL
  Filled 2015-03-23: qty 1

## 2015-03-23 MED ORDER — METRONIDAZOLE 500 MG PO TABS: 500.0000 mg | ORAL_TABLET | Freq: Two times a day (BID) | ORAL | Status: DC

## 2015-03-23 NOTE — ED Notes (Signed)
Pt reports abd pain with nausea that started on Friday. Increased pain with eating.

## 2015-03-23 NOTE — ED Provider Notes (Signed)
CSN: [id number]     Arrival date & time 03/23/15  1217 History  By signing my name below, I, Shirley Greene, attest that this documentation has been prepared under the direction and in the presence of S. Lane HackerNicole Amairany Schumpert, PA-C Electronically Signed: Soijett Greene, ED Scribe. 03/23/2015. 5:24 PM.    Chief Complaint  Patient presents with  . Abdominal Pain      The history is provided by the patient. No language interpreter was used.    HPI Comments: [name] is a 20 y.o. female who presents to the Emergency Department complaining of suprapubic abdominal pain onset 3-4 days. She notes that when she eats she then has abdominal pain that feels like a cramping sensation with associated pain. Pt states that when the pain starts it feels as if she needs to have a bowel movement, but she is unable too. She states that she is having associated symptoms of nausea, constipation, and vaginal discharge/odor/irritation. Pt set up an appointment for tomorrow for her vaginal symptoms but she voices that she would like a pelvic completed today. She states that she has tried tylenol with no relief for her symptoms. She denies fever, vomiting, difficulty urinating, and any other symptoms. Denies recent abx use, raw undercooked foods, or any PMHx. Denies having a PCP at this time.   Past Medical History  Diagnosis Date  . Anemia    Past Surgical History  Procedure Laterality Date  . Cesarean section N/A 04/05/2012    Procedure: CESAREAN SECTION;  Surgeon: Tilda BurrowJohn V Ferguson, MD;  Location: [facility];  Service: Obstetrics;  Laterality: N/A;   History reviewed. No pertinent family history. Social History  Substance Use Topics  . Smoking status: Never Smoker   . Smokeless tobacco: Never Used  . Alcohol Use: No   OB History    Gravida Para Term Preterm AB TAB SAB Ectopic Multiple Living   1 1 1       1      Review of Systems  Constitutional: Positive for chills. Negative for fever.  Gastrointestinal: Positive  for nausea, abdominal pain and constipation. Negative for vomiting.  Genitourinary: Negative for difficulty urinating.      Allergies  Review of patient's allergies indicates no known allergies.  Home Medications   Prior to Admission medications   Medication Sig Start Date End Date Taking? Authorizing Provider  levonorgestrel-ethinyl estradiol (AVIANE,ALESSE,LESSINA) 0.1-20 MG-MCG tablet Take 1 tablet by mouth daily. 11/04/14   Adam PhenixJames G Arnold, MD  metroNIDAZOLE (METROGEL VAGINAL) 0.75 % vaginal gel Place 1 Applicatorful vaginally 2 (two) times daily. 01/08/15   Jethro BastosUgonna A Anyanwu, MD   BP 103/76 mmHg  Pulse 77  Temp(Src) 98.2 F (36.8 C) (Oral)  Resp 18  Ht 5' (1.524 m)  Wt 144 lb (65.318 kg)  BMI 28.12 kg/m2  SpO2 100%  LMP 02/27/2015 Physical Exam  Constitutional: She is oriented to person, place, and time. She appears well-developed and well-nourished. No distress.  HENT:  Head: Normocephalic and atraumatic.  Eyes: EOM are normal.  Neck: Neck supple.  Cardiovascular: Normal rate.   Pulmonary/Chest: Effort normal. No respiratory distress.  Abdominal: Soft. There is no tenderness.  Genitourinary:  Chaperone present for exam. Pelvic exam: normal external genitalia, vulva, cervix, uterus and adnexa. Scant amount of blood in vaginal vault.  Musculoskeletal: Normal range of motion.  Neurological: She is alert and oriented to person, place, and time.  Skin: Skin is warm and dry.  Psychiatric: She has a normal mood and affect. Her  behavior is normal.  Nursing note and vitals reviewed.   ED Course  Procedures  DIAGNOSTIC STUDIES: Oxygen Saturation is 100% on RA, nl by my interpretation.    COORDINATION OF CARE: 5:21 PM Discussed treatment plan with pt at bedside which includes labs, UA, bentyl, pelvic exam, RPR, HIV antibody, GC/Chlamydia probe and pt agreed to plan.    Labs Review Labs Reviewed  WET PREP, GENITAL - Abnormal; Notable for the following:    Clue Cells  Wet Prep HPF POC PRESENT (*)    WBC, Wet Prep HPF POC MANY (*)    All other components within normal limits  COMPREHENSIVE METABOLIC PANEL - Abnormal; Notable for the following:    ALT 13 (*)    All other components within normal limits  CBC - Abnormal; Notable for the following:    RBC 5.46 (*)    MCV 76.7 (*)    MCH 24.2 (*)    All other components within normal limits  URINALYSIS, ROUTINE W REFLEX MICROSCOPIC (NOT AT [facility]) - Abnormal; Notable for the following:    APPearance CLOUDY (*)    Hgb urine dipstick MODERATE (*)    All other components within normal limits  URINE MICROSCOPIC-ADD ON - Abnormal; Notable for the following:    Squamous Epithelial / LPF 0-5 (*)    Bacteria, UA RARE (*)    Crystals CA OXALATE CRYSTALS (*)    All other components within normal limits  LIPASE, BLOOD  RPR  HIV ANTIBODY (ROUTINE TESTING)  I-STAT BETA HCG BLOOD, ED ([facility], [facility], [facility] ONLY)  GC/CHLAMYDIA PROBE AMP ([place]) NOT AT [facility]    MDM   Final diagnoses:  Abdominal pain, unspecified abdominal location  Vaginal discharge   Patient nontoxic-appearing, vital signs stable. Patient with clue cells and white blood cells and wet prep. Patient with moderate hemoglobin on urinalysis; however, and this is most likely due to contamination from menses. Lipase, hCG, CMP, CBC unremarkable.   Will treat for bacterial vaginosis and provide symptomatic treatment with Zofran. Patient requesting medication for yeast infection that tends to occur after treatment with Flagyl.  Patient is nontoxic, nonseptic appearing, in no apparent distress.  Patient's pain and other symptoms adequately managed in emergency department.  Labs, imaging and vitals reviewed.  Patient does not meet the SIRS or Sepsis criteria.  On repeat exam patient does not have a surgical abdomen and there are no peritoneal signs.  No indication of appendicitis, bowel obstruction, bowel perforation, cholecystitis, diverticulitis, PID or ectopic  pregnancy.  Patient discharged home with symptomatic treatment and given strict instructions for follow-up with their primary care physician.  I have also discussed reasons to return immediately to the ER.  Patient expresses understanding and agrees with plan.    I personally performed the services described in this documentation, which was scribed in my presence. The recorded information has been reviewed and is accurate.   Melton Krebs, PA-C 03/26/15 2309  Courteney Randall An, MD 03/27/15 1048

## 2015-03-23 NOTE — Discharge Instructions (Signed)
Shirley Greene,  Nice meeting you! Please follow-up with your gynecologist and primary care provider. Return to the emergency department if you develop increased abdominal pain, are unable to keep foods down, have fevers. Feel better soon!  S. [name], PA-C Abdominal Pain, Adult Many things can cause abdominal pain. Usually, abdominal pain is not caused by a disease and will improve without treatment. It can often be observed and treated at home. Your health care provider will do a physical exam and possibly order blood tests and X-rays to help determine the seriousness of your pain. However, in many cases, more time must pass before a clear cause of the pain can be found. Before that point, your health care provider may not know if you need more testing or further treatment. HOME CARE INSTRUCTIONS Monitor your abdominal pain for any changes. The following actions may help to alleviate any discomfort you are experiencing:  Only take over-the-counter or prescription medicines as directed by your health care provider.  Do not take laxatives unless directed to do so by your health care provider.  Try a clear liquid diet (broth, tea, or water) as directed by your health care provider. Slowly move to a bland diet as tolerated. SEEK MEDICAL CARE IF:  You have unexplained abdominal pain.  You have abdominal pain associated with nausea or diarrhea.  You have pain when you urinate or have a bowel movement.  You experience abdominal pain that wakes you in the night.  You have abdominal pain that is worsened or improved by eating food.  You have abdominal pain that is worsened with eating fatty foods.  You have a fever. SEEK IMMEDIATE MEDICAL CARE IF:  Your pain does not go away within 2 hours.  You keep throwing up (vomiting).  Your pain is felt only in portions of the abdomen, such as the right side or the left lower portion of the abdomen.  You pass bloody or black tarry  stools. MAKE SURE YOU:  Understand these instructions.  Will watch your condition.  Will get help right away if you are not doing well or get worse.   This information is not intended to replace advice given to you by your health care provider. Make sure you discuss any questions you have with your health care provider.   Document Released: 10/12/2004 Document Revised: 09/23/2014 Document Reviewed: 09/11/2012 Elsevier Interactive Patient Education [employer]2016 [employer]

## 2015-03-24 ENCOUNTER — Ambulatory Visit: Payer: Medicaid Other | Admitting: Family Medicine

## 2015-03-24 DIAGNOSIS — Z792 Long term (current) use of antibiotics: Secondary | ICD-10-CM | POA: Diagnosis not present

## 2015-03-24 DIAGNOSIS — X58XXXA Exposure to other specified factors, initial encounter: Secondary | ICD-10-CM | POA: Insufficient documentation

## 2015-03-24 DIAGNOSIS — Z79899 Other long term (current) drug therapy: Secondary | ICD-10-CM | POA: Insufficient documentation

## 2015-03-24 DIAGNOSIS — Z862 Personal history of diseases of the blood and blood-forming organs and certain disorders involving the immune mechanism: Secondary | ICD-10-CM | POA: Diagnosis not present

## 2015-03-24 DIAGNOSIS — Y9389 Activity, other specified: Secondary | ICD-10-CM | POA: Diagnosis not present

## 2015-03-24 DIAGNOSIS — Z793 Long term (current) use of hormonal contraceptives: Secondary | ICD-10-CM | POA: Diagnosis not present

## 2015-03-24 DIAGNOSIS — Y998 Other external cause status: Secondary | ICD-10-CM | POA: Insufficient documentation

## 2015-03-24 DIAGNOSIS — S39012A Strain of muscle, fascia and tendon of lower back, initial encounter: Secondary | ICD-10-CM | POA: Insufficient documentation

## 2015-03-24 DIAGNOSIS — Y9289 Other specified places as the place of occurrence of the external cause: Secondary | ICD-10-CM | POA: Insufficient documentation

## 2015-03-24 DIAGNOSIS — S3992XA Unspecified injury of lower back, initial encounter: Secondary | ICD-10-CM | POA: Diagnosis present

## 2015-03-24 LAB — RPR: RPR: NONREACTIVE

## 2015-03-24 LAB — GC/CHLAMYDIA PROBE AMP (~~LOC~~) NOT AT ARMC
Chlamydia: NEGATIVE
Neisseria Gonorrhea: NEGATIVE

## 2015-03-24 LAB — HIV ANTIBODY (ROUTINE TESTING W REFLEX): HIV SCREEN 4TH GENERATION: NONREACTIVE

## 2015-03-25 ENCOUNTER — Emergency Department (HOSPITAL_COMMUNITY)
Admission: EM | Admit: 2015-03-25 | Discharge: 2015-03-25 | Disposition: A | Discharge status: Home / Self Care | Payer: Medicaid Other | Attending: Emergency Medicine | Admitting: Emergency Medicine

## 2015-03-25 ENCOUNTER — Encounter (HOSPITAL_COMMUNITY): Payer: Self-pay | Admitting: Emergency Medicine

## 2015-03-25 DIAGNOSIS — S39012A Strain of muscle, fascia and tendon of lower back, initial encounter: Secondary | ICD-10-CM

## 2015-03-25 MED ORDER — NAPROXEN 500 MG PO TABS: 500.0000 mg | ORAL_TABLET | Freq: Two times a day (BID) | ORAL | Status: DC

## 2015-03-25 MED ORDER — METHOCARBAMOL 500 MG PO TABS: 500.0000 mg | ORAL_TABLET | Freq: Two times a day (BID) | ORAL | Status: DC | PRN

## 2015-03-25 MED ORDER — NAPROXEN 500 MG PO TABS
500.0000 mg | ORAL_TABLET | Freq: Once | ORAL | Status: AC
  Administered 2015-03-25: 500 mg via ORAL
  Filled 2015-03-25: qty 1

## 2015-03-25 NOTE — ED Provider Notes (Signed)
CSN: [id number]     Arrival date & time 03/24/15  2356 History   First MD Initiated Contact with Patient 03/25/15 0543     Chief Complaint  Patient presents with  . Back Pain     (Consider location/radiation/quality/duration/timing/severity/associated sxs/prior Treatment) Patient is a 20 y.o. female presenting with back pain. The history is provided by the patient. No language interpreter was used.  Back Pain Location:  Lumbar spine Quality:  Aching and stabbing Pain severity:  Mild Onset quality:  Gradual Duration:  1 day Timing:  Intermittent Progression:  Waxing and waning Chronicity:  New Context comment:  Patient menstruating Relieved by:  Nothing Worsened by:  Bending and movement Ineffective treatments: Tylenol. Associated symptoms: no bladder incontinence, no bowel incontinence, no dysuria, no fever, no numbness and no perianal numbness     Past Medical History  Diagnosis Date  . Anemia    Past Surgical History  Procedure Laterality Date  . Cesarean section N/A 04/05/2012    Procedure: CESAREAN SECTION;  Surgeon: Tilda Burrow, MD;  Location: [facility];  Service: Obstetrics;  Laterality: N/A;   No family history on file. Social History  Substance Use Topics  . Smoking status: Never Smoker   . Smokeless tobacco: Never Used  . Alcohol Use: No   OB History    Gravida Para Term Preterm AB TAB SAB Ectopic Multiple Living   Review of Systems  Constitutional: Negative for fever.  Gastrointestinal: Negative for bowel incontinence.  Genitourinary: Negative for bladder incontinence and dysuria.  Musculoskeletal: Positive for back pain.  Neurological: Negative for numbness.  All other systems reviewed and are negative.   Allergies  Review of patient's allergies indicates no known allergies.  Home Medications   Prior to Admission medications   Medication Sig Start Date End Date Taking? Authorizing Provider  fluconazole (DIFLUCAN) 200 MG  tablet Take 1 tablet (200 mg total) by mouth daily. 03/23/15 03/30/15  Melton Krebs, PA-C  levonorgestrel-ethinyl estradiol (AVIANE,ALESSE,LESSINA) 0.1-20 MG-MCG tablet Take 1 tablet by mouth daily. 11/04/14   Adam Phenix, MD  methocarbamol (ROBAXIN) 500 MG tablet Take 1 tablet (500 mg total) by mouth 2 (two) times daily as needed for muscle spasms. 03/25/15   Antony Madura, PA-C  metroNIDAZOLE (FLAGYL) 500 MG tablet Take 1 tablet (500 mg total) by mouth 2 (two) times daily. 03/23/15   Melton Krebs, PA-C  metroNIDAZOLE (METROGEL VAGINAL) 0.75 % vaginal gel Place 1 Applicatorful vaginally 2 (two) times daily. 01/08/15   Tereso Newcomer, MD  naproxen (NAPROSYN) 500 MG tablet Take 1 tablet (500 mg total) by mouth 2 (two) times daily. 03/25/15   Antony Madura, PA-C  ondansetron (ZOFRAN) 4 MG tablet Take 1 tablet (4 mg total) by mouth every 6 (six) hours. 03/23/15   Melton Krebs, PA-C   BP 104/58 mmHg  Pulse 86  Temp(Src) 98.4 F (36.9 C) (Oral)  Resp 18  SpO2 99%  LMP 02/27/2015   Physical Exam  Constitutional: She is oriented to person, place, and time. She appears well-developed and well-nourished. No distress.  Nontoxic/nonseptic appearing  HENT:  Head: Normocephalic and atraumatic.  Eyes: Conjunctivae and EOM are normal. No scleral icterus.  Neck: Normal range of motion.  Cardiovascular: Normal rate, regular rhythm and intact distal pulses.   DP and PT pulses 2+ b/l  Pulmonary/Chest: Effort normal. No respiratory distress.  Respirations even and unlabored  Abdominal:  Soft. She exhibits no distension. There is no tenderness.  Soft, nontender abdomen.  Musculoskeletal: Normal range of motion. She exhibits tenderness.  B/l lumbar paraspinal muscles. No TTP to the thoracic or lumbar midline. No bony deformities, step offs, or crepitus.  Neurological: She is alert and oriented to person, place, and time. She exhibits normal muscle tone. Coordination normal.  Patient moving  all extremities.  Skin: Skin is warm and dry. No rash noted. She is not diaphoretic. No erythema. No pallor.  Psychiatric: She has a normal mood and affect. Her behavior is normal.  Nursing note and vitals reviewed.   ED Course  Procedures (including critical care time) Labs Review Results for orders placed or performed during the hospital encounter of 03/23/15  Wet prep, genital  Result Value Ref Range   Yeast Wet Prep HPF POC NONE SEEN NONE SEEN   Trich, Wet Prep NONE SEEN NONE SEEN   Clue Cells Wet Prep HPF POC PRESENT (A) NONE SEEN   WBC, Wet Prep HPF POC MANY (A) NONE SEEN   Sperm NONE SEEN   Lipase, blood  Result Value Ref Range   Lipase 24 11 - 51 U/L  Comprehensive metabolic panel  Result Value Ref Range   Sodium 141 135 - 145 mmol/L   Potassium 3.8 3.5 - 5.1 mmol/L   Chloride 108 101 - 111 mmol/L   CO2 22 22 - 32 mmol/L   Glucose, Bld 85 65 - 99 mg/dL   BUN 10 6 - 20 mg/dL   Creatinine, Ser 9.60 0.44 - 1.00 mg/dL   Calcium 9.4 8.9 - 45.4 mg/dL   Total Protein 7.4 6.5 - 8.1 g/dL   Albumin 4.0 3.5 - 5.0 g/dL   AST 17 15 - 41 U/L   ALT 13 (L) 14 - 54 U/L   Alkaline Phosphatase 51 38 - 126 U/L   Total Bilirubin 0.9 0.3 - 1.2 mg/dL   GFR calc non Af Amer >60 >60 mL/min   GFR calc Af Amer >60 >60 mL/min   Anion gap 11 5 - 15  CBC  Result Value Ref Range   WBC 6.8 4.0 - 10.5 K/uL   RBC 5.46 (H) 3.87 - 5.11 MIL/uL   Hemoglobin 13.2 12.0 - 15.0 g/dL   HCT 09.8 11.9 - 14.7 %   MCV 76.7 (L) 78.0 - 100.0 fL   MCH 24.2 (L) 26.0 - 34.0 pg   MCHC 31.5 30.0 - 36.0 g/dL   RDW 82.9 56.2 - 13.0 %   Platelets 311 150 - 400 K/uL  Urinalysis, Routine w reflex microscopic (not at [facility])  Result Value Ref Range   Color, Urine YELLOW YELLOW   APPearance CLOUDY (A) CLEAR   Specific Gravity, Urine 1.024 1.005 - 1.030   pH 6.0 5.0 - 8.0   Glucose, UA NEGATIVE NEGATIVE mg/dL   Hgb urine dipstick MODERATE (A) NEGATIVE   Bilirubin Urine NEGATIVE NEGATIVE   Ketones, ur NEGATIVE  NEGATIVE mg/dL   Protein, ur NEGATIVE NEGATIVE mg/dL   Nitrite NEGATIVE NEGATIVE   Leukocytes, UA NEGATIVE NEGATIVE  RPR  Result Value Ref Range   RPR Ser Ql Non Reactive Non Reactive  HIV antibody  Result Value Ref Range   HIV Screen 4th Generation wRfx Non Reactive Non Reactive  Urine microscopic-add on  Result Value Ref Range   Squamous Epithelial / LPF 0-5 (A) NONE SEEN   WBC, UA 0-5 0 - 5 WBC/hpf   RBC / HPF 0-5 0 - 5 RBC/hpf  Bacteria, UA RARE (A) NONE SEEN   Crystals CA OXALATE CRYSTALS (A) NEGATIVE  I-Stat beta hCG blood, ED ([facility], [facility], [facility] only)  Result Value Ref Range   I-stat hCG, quantitative <5.0 <5 mIU/mL   Comment 3          GC/Chlamydia probe amp  Result Value Ref Range   Chlamydia Negative    Neisseria gonorrhea Negative    Imaging Review No results found.   I have personally reviewed and evaluated these images and lab results as part of my medical decision-making.   EKG Interpretation None      MDM   Final diagnoses:  Low back strain, initial encounter    Patient with atraumatic back pain x 1 day. Patient neurovascularly intact. Patient can walk but states is painful. No loss of bowel or bladder control. No concern for cauda equina. No fever, hx of CA, or hx of IVDU. Likely MSK etiology, but also possible pain is referred from menses. Patient had a complete and unremarkable laboratory work up yesterday. RICE protocol and pain medicine indicated and discussed with patient. No indication for further emergent work up. Return precautions discussed and provided. Patient discharged in good condition with no unaddressed concerns.   Filed Vitals:   03/25/15 0010 03/25/15 0558  BP: 116/70 104/58  Pulse: 76 86  Temp: 98.1 F (36.7 C) 98.4 F (36.9 C)  TempSrc: Oral Oral  Resp: 16 18  SpO2: 100% 99%     Antony MaduraKelly Taleeya Blondin, PA-C 03/25/15 0726  Layla MawKristen N Ward, DO 03/25/15 [phone]

## 2015-03-25 NOTE — ED Notes (Signed)
Pt. Reports bilateral lower back pain onset today , denies injury or urinary discomfort , pain increases with movement / changing positions . Pt. was seen here yesterday blood tests / urine test done .

## 2015-03-25 NOTE — Discharge Instructions (Signed)

## 2015-03-30 ENCOUNTER — Telehealth: Payer: Self-pay | Admitting: *Deleted

## 2015-03-30 DIAGNOSIS — B9689 Other specified bacterial agents as the cause of diseases classified elsewhere: Secondary | ICD-10-CM

## 2015-03-30 DIAGNOSIS — N760 Acute vaginitis: Principal | ICD-10-CM

## 2015-03-30 MED ORDER — METRONIDAZOLE 0.75 % VA GEL: 1.0000 | Freq: Every day | VAGINAL | Status: DC

## 2015-03-30 NOTE — Telephone Encounter (Signed)
Pt being treated for BV, was given Flagyl by mouth, is making her sick to her stomach.  Would like to change treatment to the vaginal gel.  Sent rx to pharmacy.

## 2015-03-30 NOTE — Telephone Encounter (Signed)
-----   Message from Olevia BowensJacinda S Battle sent at 03/30/2015  1:12 PM EDT ----- Regarding: Rx Request Contact: [phone] Went to ED last week, states they gave her flagyl, and its making her sick, wants to know if she can have the gel called into her pharmacy

## 2015-06-18 ENCOUNTER — Other Ambulatory Visit (HOSPITAL_COMMUNITY)
Admission: RE | Admit: 2015-06-18 | Discharge: 2015-06-18 | Disposition: A | Discharge status: Home / Self Care | Payer: Medicaid Other | Source: Ambulatory Visit | Attending: Obstetrics and Gynecology | Admitting: Obstetrics and Gynecology

## 2015-06-18 ENCOUNTER — Encounter: Payer: Self-pay | Admitting: Obstetrics and Gynecology

## 2015-06-18 ENCOUNTER — Ambulatory Visit (INDEPENDENT_AMBULATORY_CARE_PROVIDER_SITE_OTHER): Payer: Medicaid Other | Admitting: Obstetrics and Gynecology

## 2015-06-18 VITALS — BP 96/69 | HR 92 | Ht 60.0 in | Wt 150.0 lb

## 2015-06-18 DIAGNOSIS — Z113 Encounter for screening for infections with a predominantly sexual mode of transmission: Secondary | ICD-10-CM | POA: Insufficient documentation

## 2015-06-18 DIAGNOSIS — R350 Frequency of micturition: Secondary | ICD-10-CM

## 2015-06-18 DIAGNOSIS — N760 Acute vaginitis: Secondary | ICD-10-CM

## 2015-06-18 LAB — POCT URINALYSIS DIPSTICK
Bilirubin, UA: NEGATIVE
Glucose, UA: NEGATIVE
KETONES UA: NEGATIVE
Leukocytes, UA: NEGATIVE
Nitrite, UA: NEGATIVE
PH UA: 5
PROTEIN UA: NEGATIVE
SPEC GRAV UA: 1.02
Urobilinogen, UA: NEGATIVE

## 2015-06-18 NOTE — Progress Notes (Signed)
Here today for vaginal irritation and vaginal itching.  Also has some urinary frequency.

## 2015-06-18 NOTE — Progress Notes (Signed)
Patient ID: Shirley LunchDeonna Greene, female   DOB: 1995/12/20, 20 y.o.   MRN: [id number] 20 yo G1P1 presenting today for the evaluation of vaginitis. Patient reports the onset of vaginal pruritis approximately a month ago. She denies the presence of a discharge. She denies abdominal/pelvic pain. She is sexually active using condoms for contraception. She may have been using a different brand of tampons but she is not certain. She also has been experiencing some frequency and mild discomfort with urination.  Past Medical History  Diagnosis Date  . Anemia    Past Surgical History  Procedure Laterality Date  . Cesarean section N/A 04/05/2012    Procedure: CESAREAN SECTION;  Surgeon: Tilda BurrowJohn V Ferguson, MD;  Location: [facility];  Service: Obstetrics;  Laterality: N/A;   Family History  Problem Relation Age of Onset  . Hypertension Mother   . Hypertension Father    Social History  Substance Use Topics  . Smoking status: Never Smoker   . Smokeless tobacco: Never Used  . Alcohol Use: No   ROS See pertinent in HPI  Blood pressure 96/69, pulse 92, height 5' (1.524 m), weight 150 lb (68.04 kg), last menstrual period 06/15/2015. GENERAL: Well-developed, well-nourished female in no acute distress.  ABDOMEN: Soft, nontender, nondistended. No organomegaly. PELVIC: Normal external female genitalia. Vagina is pink and rugated.  Normal discharge. Small amount of blood in vaginal vault. Normal appearing cervix. Uterus is normal in size. No adnexal mass or tenderness. EXTREMITIES: No cyanosis, clubbing, or edema, 2+ distal pulses.  A/P 20 yo with vaginitis and UTI symptoms - wet prep and cultures collected - Urine culture sent - patient will be contacted with any abnormal results - Patient informed that if tests are negative, her vaginitis may as a result of a sensitivity to different laundry detergent, body soap, tampon brand and even condoms

## 2015-06-19 LAB — WET PREP, GENITAL
TRICH WET PREP: NONE SEEN
Yeast Wet Prep HPF POC: NONE SEEN

## 2015-06-20 LAB — URINE CULTURE: Colony Count: >=100000

## 2015-06-21 ENCOUNTER — Other Ambulatory Visit: Payer: Self-pay | Admitting: Obstetrics and Gynecology

## 2015-06-21 ENCOUNTER — Telehealth: Payer: Self-pay | Admitting: *Deleted

## 2015-06-21 DIAGNOSIS — B9689 Other specified bacterial agents as the cause of diseases classified elsewhere: Secondary | ICD-10-CM

## 2015-06-21 DIAGNOSIS — N760 Acute vaginitis: Principal | ICD-10-CM

## 2015-06-21 LAB — GC/CHLAMYDIA PROBE AMP (~~LOC~~) NOT AT ARMC
Chlamydia: NEGATIVE
NEISSERIA GONORRHEA: NEGATIVE

## 2015-06-21 MED ORDER — METRONIDAZOLE 0.75 % VA GEL: 1.0000 | Freq: Every day | VAGINAL | Status: DC

## 2015-06-21 MED ORDER — METRONIDAZOLE 500 MG PO TABS: 500.0000 mg | ORAL_TABLET | Freq: Two times a day (BID) | ORAL | Status: DC

## 2015-06-21 NOTE — Telephone Encounter (Signed)
-----   Message from Peggy Constant, MD sent at 06/21/2015  8:40 AM EDT ----- Please inform the patient of a BV infection. Flagyl has been e-prescribed  Thanks  Peggy 

## 2015-06-21 NOTE — Telephone Encounter (Signed)
Called pt, no answer, left message to call the office.  

## 2015-06-21 NOTE — Telephone Encounter (Signed)
Pt returned call, informed her of result and that medication had been sent to pharmacy.  Pt prefers the metrogel treatment for BV, sent new rx to pharmacy, instructed pt on medication use.

## 2015-06-21 NOTE — Telephone Encounter (Signed)
-----   Message from Catalina AntiguaPeggy Constant, MD sent at 06/21/2015  8:40 AM EDT ----- Please inform the patient of a BV infection. Flagyl has been e-prescribed  AnimatorThanks  Peggy

## 2015-07-02 ENCOUNTER — Telehealth: Payer: Self-pay | Admitting: *Deleted

## 2015-07-02 DIAGNOSIS — B379 Candidiasis, unspecified: Secondary | ICD-10-CM

## 2015-07-02 MED ORDER — FLUCONAZOLE 150 MG PO TABS: 150.0000 mg | ORAL_TABLET | Freq: Once | ORAL | Status: DC

## 2015-07-02 NOTE — Telephone Encounter (Signed)
-----   Message from Olevia BowensJacinda S Battle sent at 07/02/2015  8:37 AM EDT ----- Regarding: Rx Request Contact: [phone] Was treated last week for BV, now has symptoms of a yeast infection, would like something called in for a yeast infection. [facility] [address]

## 2015-07-02 NOTE — Telephone Encounter (Signed)
Spoke to pt, experiencing vaginal burning and discharge consistent with yeast, sent rx fro Diflucan to pharmacy, instructed pt on medication use.

## 2015-08-13 ENCOUNTER — Inpatient Hospital Stay (HOSPITAL_COMMUNITY)
Admission: AD | Admit: 2015-08-13 | Discharge: 2015-08-13 | Disposition: A | Discharge status: Home / Self Care | Payer: Medicaid Other | Source: Ambulatory Visit | Attending: Obstetrics & Gynecology | Admitting: Obstetrics & Gynecology

## 2015-08-13 ENCOUNTER — Encounter (HOSPITAL_COMMUNITY): Payer: Self-pay | Admitting: *Deleted

## 2015-08-13 DIAGNOSIS — N912 Amenorrhea, unspecified: Secondary | ICD-10-CM | POA: Diagnosis present

## 2015-08-13 DIAGNOSIS — Z3200 Encounter for pregnancy test, result unknown: Secondary | ICD-10-CM | POA: Insufficient documentation

## 2015-08-13 HISTORY — DX: Unspecified infectious disease: B99.9

## 2015-08-13 HISTORY — DX: Chlamydial infection, unspecified: A74.9

## 2015-08-13 LAB — POCT PREGNANCY, URINE: Preg Test, Ur: NEGATIVE

## 2015-08-13 NOTE — MAU Provider Note (Signed)
[name] is a 20 y.o. G1P1001 at who presents to MAU today for pregnancy verification. The patient reports some mild abdominal cramping but not today. She also had one pink smear yesterday on her underwear.   BP 120/71 (BP Location: Right Arm)   Pulse 98   Temp 98.6 F (37 C) (Oral)   Resp 16   Wt 70 kg (154 lb 6.4 oz)   LMP 07/01/2015   BMI 30.15 kg/m   CONSTITUTIONAL: Well-developed, well-nourished female in no acute distress.  CARDIOVASCULAR: Regular heart rate RESPIRATORY: Normal effort NEUROLOGICAL: Alert and oriented to person, place, and time.  SKIN: Skin is warm and dry. No rash noted. Not diaphoretic. No erythema. No pallor. PSYCH: Normal mood and affect. Normal behavior. Normal judgment and thought content.  MDM Medical Screen Exam Complete Results for orders placed or performed during the hospital encounter of 08/13/15 (from the past 24 hour(s))  Pregnancy, urine POC     Status: None   Collection Time: 08/13/15  4:48 PM  Result Value Ref Range   Preg Test, Ur NEGATIVE NEGATIVE    A: Amenorrhea  P: Discharge from MAU Recommend home UPT in 1 week if no menses If positive HPT-patient advised to follow-up with [facility] for pregnancy confirmation  Monday-Thursday 8am-4pm or Friday 8am-11am Patient may return to MAU as needed or if her condition were to change or worsen   Donette Larry, CNM  08/13/2015 4:56 PM

## 2015-08-13 NOTE — MAU Note (Signed)
No period this month.  Thought period was coming on , noted some clear d/c - light pink  Noted when wiped. Only noted one time.  Has had some mild cramps at times, nothing that required medicine. Neg HPT yesterday

## 2015-08-19 ENCOUNTER — Emergency Department (HOSPITAL_COMMUNITY)
Admission: EM | Admit: 2015-08-19 | Discharge: 2015-08-19 | Disposition: A | Discharge status: Home / Self Care | Payer: Medicaid Other | Attending: Emergency Medicine | Admitting: Emergency Medicine

## 2015-08-19 ENCOUNTER — Encounter (HOSPITAL_COMMUNITY): Payer: Self-pay | Admitting: *Deleted

## 2015-08-19 DIAGNOSIS — R220 Localized swelling, mass and lump, head: Secondary | ICD-10-CM

## 2015-08-19 DIAGNOSIS — L089 Local infection of the skin and subcutaneous tissue, unspecified: Secondary | ICD-10-CM | POA: Insufficient documentation

## 2015-08-19 MED ORDER — CLINDAMYCIN HCL 150 MG PO CAPS: 300.0000 mg | ORAL_CAPSULE | Freq: Three times a day (TID) | ORAL | 0 refills | Status: DC

## 2015-08-19 NOTE — ED Provider Notes (Signed)
[facility]Y DEPT Provider Note   CSN: [id number] Arrival date & time: 08/19/15  0459  First Provider Initiated Contact with Patient 08/19/15 0906      By signing my name below, I, Jasmyn B. Alexander, attest that this documentation has been prepared under the direction and in the presence of Roxy Horseman, PA-C. Electronically Signed: Gillis Ends. Lyn Hollingshead, ED Scribe. 08/19/15. 9:15 AM.  History   Chief Complaint Chief Complaint  Patient presents with  . Facial Swelling    HPI HPI Comments: Shirley Greene is a 20 y.o. female who presents to the Emergency Department complaining of gradual worsening, constant, swollen upper lip x 1 day. Pt reports that the area started as a "hair bump" and she applied alcohol to lip with no relief. She has applied Carmex to her lip and attempted to drain the area which caused swelling to worsen. Pain is present upon applying pressure to lip.  Denies fevers, chills, or vomiting.  Pt denies any hx of cold sores. Denies being pregnant/breat feeding at this time.  The history is provided by the patient. No language interpreter was used.     Past Medical History:  Diagnosis Date  . Anemia   . Chlamydia   . Infection    UTI    Patient Active Problem List   Diagnosis Date Noted  . 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 Status post primary low transverse cesarean section 04/10/2012    Past Surgical History:  Procedure Laterality Date  . CESAREAN SECTION N/A 04/05/2012   Procedure: CESAREAN SECTION;  Surgeon: Tilda Burrow, MD;  Location: [facility];  Service: Obstetrics;  Laterality: N/A;    OB History    Gravida Para Term Preterm AB Living   1 1 1     1    SAB TAB Ectopic Multiple Live Births           1      Obstetric Comments   "OP" presentation, got stuck at 9cm      Home Medications    Prior to Admission medications   Medication Sig Start Date End Date Taking? Authorizing Provider  fluconazole  (DIFLUCAN) 150 MG tablet Take 1 tablet (150 mg total) by mouth once. Can take additional dose three days later if symptoms persist 07/02/15   Reva Bores, MD  methocarbamol (ROBAXIN) 500 MG tablet Take 1 tablet (500 mg total) by mouth 2 (two) times daily as needed for muscle spasms. 03/25/15   Antony Madura, PA-C  metroNIDAZOLE (FLAGYL) 500 MG tablet Take 1 tablet (500 mg total) by mouth 2 (two) times daily. 06/21/15   Peggy Constant, MD  metroNIDAZOLE (METROGEL) 0.75 % vaginal gel Place 1 Applicatorful vaginally at bedtime. Apply one applicatorful to vagina at bedtime for 5 days 06/21/15   Catalina Antigua, MD  naproxen (NAPROSYN) 500 MG tablet Take 1 tablet (500 mg total) by mouth 2 (two) times daily. 03/25/15   Antony Madura, PA-C    Family History Family History  Problem Relation Age of Onset  . Hypertension Mother   . Hypertension Father   . Cancer Maternal Grandfather     Social History Social History  Substance Use Topics  . Smoking status: Never Smoker  . Smokeless tobacco: Never Used  . Alcohol use Yes     Comment: twice a month     Allergies   Review of patient's allergies indicates no known allergies.   Review of Systems Review of Systems  Constitutional: Negative  for fever.  HENT: Positive for facial swelling.   All other systems reviewed and are negative.  Physical Exam Updated Vital Signs BP 108/65 (BP Location: Right Arm)   Pulse 78   Temp 98.5 F (36.9 C) (Oral)   Resp 18   LMP 07/01/2015   SpO2 100%   Physical Exam  Constitutional: She is oriented to person, place, and time. No distress.  HENT:  Head: Normocephalic and atraumatic.  Left upper lip is mildly swollen with associated swollen hair follicle, no obvious abscess No tongue swelling  Eyes: Conjunctivae and EOM are normal. Pupils are equal, round, and reactive to light.  Neck: No tracheal deviation present.  Cardiovascular: Normal rate.   Pulmonary/Chest: Effort normal. No respiratory distress.    Abdominal: Soft.  Musculoskeletal: Normal range of motion.  Neurological: She is alert and oriented to person, place, and time.  Skin: Skin is warm and dry. She is not diaphoretic.  Psychiatric: Judgment normal.  Nursing note and vitals reviewed.    ED Treatments / Results  DIAGNOSTIC STUDIES: Oxygen Saturation is 100% on RA, normal by my interpretation.    COORDINATION OF CARE: 9:10 AM-Discussed treatment plan which includes order of Clindamycin and follow-up instructions with pt at bedside and pt agreed to plan.   Procedures Procedures (including critical care time)   Initial Impression / Assessment and Plan / ED Course  I have reviewed the triage vital signs and the nursing notes.  Pertinent labs & imaging results that were available during my care of the patient were reviewed by me and considered in my medical decision making (see chart for details).  Clinical Course    Patient with upper lip swelling.  Appears to be associated with a swollen hair follicle.  Nothing to drain currently.  Recommend tx with abx.  Return precautions given. If it worsens, it may require I&D.  Patient understands and agrees with the plan.  Final Clinical Impressions(s) / ED Diagnoses   Final diagnoses:  Swollen lip  Pustule    New Prescriptions New Prescriptions   No medications on file   I personally performed the services described in this documentation, which was scribed in my presence. The recorded information has been reviewed and is accurate.       Roxy Horseman, PA-C 08/19/15 0920    Shaune Pollack, MD 08/19/15 2053

## 2015-08-19 NOTE — ED Triage Notes (Signed)
PT has a raised red area of skin onLt side of upper lip.

## 2015-08-19 NOTE — Discharge Instructions (Signed)
You are being treated for an infection of the skin involving your upper lip.  At this time, there is not an indication for incision or drainage, therefore we will try treatment with antibiotics.  If the lip continues to swell or comes to a head without draining on its own, you run a fever, or have generally worsening symptoms please return to the ER for reassessment.

## 2015-08-19 NOTE — ED Notes (Signed)
Declined W/C at D/C and was escorted to lobby by RN. 

## 2015-08-25 ENCOUNTER — Encounter: Payer: Self-pay | Admitting: Family Medicine

## 2015-08-25 ENCOUNTER — Other Ambulatory Visit (HOSPITAL_COMMUNITY)
Admission: RE | Admit: 2015-08-25 | Discharge: 2015-08-25 | Disposition: A | Discharge status: Home / Self Care | Payer: Medicaid Other | Source: Ambulatory Visit | Attending: Family Medicine | Admitting: Family Medicine

## 2015-08-25 ENCOUNTER — Ambulatory Visit (INDEPENDENT_AMBULATORY_CARE_PROVIDER_SITE_OTHER): Payer: Medicaid Other | Admitting: Family Medicine

## 2015-08-25 VITALS — BP 110/66 | HR 82 | Ht 60.0 in | Wt 154.0 lb

## 2015-08-25 DIAGNOSIS — Z113 Encounter for screening for infections with a predominantly sexual mode of transmission: Secondary | ICD-10-CM | POA: Diagnosis not present

## 2015-08-25 DIAGNOSIS — N898 Other specified noninflammatory disorders of vagina: Secondary | ICD-10-CM

## 2015-08-25 NOTE — Progress Notes (Signed)
SUBJECTIVE:  20 y.o. female complains of clear, yellow vaginal discharge for 3 day(s). Denies abnormal vaginal bleeding or significant pelvic pain or fever. No UTI symptoms. Denies history of known exposure to STD.  Patient's last menstrual period was 08/14/2015 (exact date).  OBJECTIVE:  She appears well, afebrile. Pelvic Exam: not completed, patient self-swab GC/Chlamydia and BD Affirm ASSESSMENT:  Rule out GC or chlamydia, yeast, BV, Trichomoniasis   PLAN:  GC and chlamydia DNA  probe sent to lab. Treatment: abstain from coitus during course of treatment and wait for results to determine medication choice ROV prn if symptoms persist or worsen  ----------- Attestation: I reviewed the chart and agree with the plan. Patient was recently seen for a similar complaint  Federico FlakeKimberly Niles Newton, MD , MPH, ABFM [facility] [place]

## 2015-08-26 LAB — WET PREP BY MOLECULAR PROBE
CANDIDA SPECIES: NEGATIVE
Gardnerella vaginalis: NEGATIVE
Trichomonas vaginosis: NEGATIVE

## 2015-08-30 LAB — GC/CHLAMYDIA PROBE AMP (~~LOC~~) NOT AT ARMC
CHLAMYDIA, DNA PROBE: NEGATIVE
NEISSERIA GONORRHEA: NEGATIVE

## 2015-09-03 ENCOUNTER — Inpatient Hospital Stay (HOSPITAL_COMMUNITY)
Admission: AD | Admit: 2015-09-03 | Discharge: 2015-09-03 | Disposition: A | Discharge status: Home / Self Care | Payer: Medicaid Other | Source: Ambulatory Visit | Attending: Obstetrics and Gynecology | Admitting: Obstetrics and Gynecology

## 2015-09-03 ENCOUNTER — Encounter (HOSPITAL_COMMUNITY): Payer: Self-pay

## 2015-09-03 DIAGNOSIS — K5900 Constipation, unspecified: Secondary | ICD-10-CM

## 2015-09-03 DIAGNOSIS — Z9889 Other specified postprocedural states: Secondary | ICD-10-CM | POA: Insufficient documentation

## 2015-09-03 DIAGNOSIS — K625 Hemorrhage of anus and rectum: Secondary | ICD-10-CM

## 2015-09-03 DIAGNOSIS — K921 Melena: Secondary | ICD-10-CM | POA: Diagnosis not present

## 2015-09-03 DIAGNOSIS — Z79899 Other long term (current) drug therapy: Secondary | ICD-10-CM | POA: Insufficient documentation

## 2015-09-03 LAB — URINALYSIS, ROUTINE W REFLEX MICROSCOPIC
BILIRUBIN URINE: NEGATIVE
Glucose, UA: NEGATIVE mg/dL
Hgb urine dipstick: NEGATIVE
Ketones, ur: NEGATIVE mg/dL
Leukocytes, UA: NEGATIVE
NITRITE: NEGATIVE
Protein, ur: NEGATIVE mg/dL
pH: 5.5 (ref 5.0–8.0)

## 2015-09-03 LAB — POCT PREGNANCY, URINE: PREG TEST UR: NEGATIVE

## 2015-09-03 MED ORDER — DOCUSATE SODIUM 100 MG PO CAPS: 100.0000 mg | ORAL_CAPSULE | Freq: Two times a day (BID) | ORAL | 2 refills | Status: DC | PRN

## 2015-09-03 MED ORDER — POLYETHYLENE GLYCOL 3350 17 G PO PACK: 17.0000 g | PACK | Freq: Every day | ORAL | 0 refills | Status: DC | PRN

## 2015-09-03 NOTE — Discharge Instructions (Signed)
Anal Fissure, Adult An anal fissure is a small tear or crack in the skin around the anus. Bleeding from a fissure usually stops on its own within a few minutes. However, bleeding will often occur again with each bowel movement until the crack heals. CAUSES This condition may be caused by:  Passing large, hard stool (feces).  Frequent diarrhea.  Constipation.  Inflammatory bowel disease (Crohn disease or ulcerative colitis).  Infections.  Anal sex. SYMPTOMS Symptoms of this condition include:  Bleeding from the rectum.  Small amounts of blood seen on your stool, on toilet paper, or in the toilet after a bowel movement.  Painful bowel movements.  Itching or irritation around the anus. DIAGNOSIS A health care provider may diagnose this condition by closely examining the anal area. An anal fissure can usually be seen with careful inspection. In some cases, a rectal exam may be performed, or a short tube (anoscope) may be used to examine the anal canal. TREATMENT Treatment for this condition may include:  Taking steps to avoid constipation. This may include making changes to your diet, such as increasing your intake of fiber or fluid.  Taking fiber supplements. These supplements can soften your stool to help make bowel movements easier. Your health care provider may also prescribe a stool softener if your stool is often hard.  Taking sitz baths. This may help to heal the tear.  Using medicated creams or ointments. These may be prescribed to lessen discomfort. HOME CARE INSTRUCTIONS Eating and Drinking  Avoid foods that may be constipating, such as bananas and dairy products.  Drink enough fluid to keep your urine clear or pale yellow.  Maintain a diet that is high in fruits, whole grains, and vegetables. General Instructions  Keep the anal area as clean and dry as possible.  Take sitz baths as told by your health care provider. Do not use soap in the sitz baths.  Take  over-the-counter and prescription medicines only as told by your health care provider.  Use creams or ointments only as told by your health care provider.  Keep all follow-up visits as told by your health care provider. This is important. SEEK MEDICAL CARE IF:  You have more bleeding.  You have a fever.  You have diarrhea that is mixed with blood.  You continue to have pain.  Your problem is getting worse rather than better.   This information is not intended to replace advice given to you by your health care provider. Make sure you discuss any questions you have with your health care provider.   Document Released: 01/02/2005 Document Revised: 09/23/2014 Document Reviewed: 03/30/2014 Elsevier Interactive Patient Education 2016 Elsevier Inc.  Constipation, Adult Constipation is when a person has fewer than three bowel movements a week, has difficulty having a bowel movement, or has stools that are dry, hard, or larger than normal. As people grow older, constipation is more common. A low-fiber diet, not taking in enough fluids, and taking certain medicines may make constipation worse.  CAUSES   Certain medicines, such as antidepressants, pain medicine, iron supplements, antacids, and water pills.   Certain diseases, such as diabetes, irritable bowel syndrome (IBS), thyroid disease, or depression.   Not drinking enough water.   Not eating enough fiber-rich foods.   Stress or travel.   Lack of physical activity or exercise.   Ignoring the urge to have a bowel movement.   Using laxatives too much.  SIGNS AND SYMPTOMS   Having fewer than three bowel   movements a week.   Straining to have a bowel movement.   Having stools that are hard, dry, or larger than normal.   Feeling full or bloated.   Pain in the lower abdomen.   Not feeling relief after having a bowel movement.  DIAGNOSIS  Your health care provider will take a medical history and perform a  physical exam. Further testing may be done for severe constipation. Some tests may include:  A barium enema X-ray to examine your rectum, colon, and, sometimes, your small intestine.   A sigmoidoscopy to examine your lower colon.   A colonoscopy to examine your entire colon. TREATMENT  Treatment will depend on the severity of your constipation and what is causing it. Some dietary treatments include drinking more fluids and eating more fiber-rich foods. Lifestyle treatments may include regular exercise. If these diet and lifestyle recommendations do not help, your health care provider may recommend taking over-the-counter laxative medicines to help you have bowel movements. Prescription medicines may be prescribed if over-the-counter medicines do not work.  HOME CARE INSTRUCTIONS   Eat foods that have a lot of fiber, such as fruits, vegetables, whole grains, and beans.  Limit foods high in fat and processed sugars, such as french fries, hamburgers, cookies, candies, and soda.   A fiber supplement may be added to your diet if you cannot get enough fiber from foods.   Drink enough fluids to keep your urine clear or pale yellow.   Exercise regularly or as directed by your health care provider.   Go to the restroom when you have the urge to go. Do not hold it.   Only take over-the-counter or prescription medicines as directed by your health care provider. Do not take other medicines for constipation without talking to your health care provider first.  SEEK IMMEDIATE MEDICAL CARE IF:   You have bright red blood in your stool.   Your constipation lasts for more than 4 days or gets worse.   You have abdominal or rectal pain.   You have thin, pencil-like stools.   You have unexplained weight loss. MAKE SURE YOU:   Understand these instructions.  Will watch your condition.  Will get help right away if you are not doing well or get worse.   This information is not intended  to replace advice given to you by your health care provider. Make sure you discuss any questions you have with your health care provider.   Document Released: 10/01/2003 Document Revised: 01/23/2014 Document Reviewed: 10/14/2012 Elsevier Interactive Patient Education 2016 Elsevier Inc.  

## 2015-09-03 NOTE — MAU Note (Signed)
Pt c/o bright red blood in stool. First noticed on Tuesday-when wiping. States that she did struggle with BM on that day. States normal BM are typically 1-2x per week but has had a BM every since Tuesday. Noticed a lot more blood on toilet paper and in stool after BM today. Denies abdominal pain. Denies taking stool softner or laxative. Has not looked to see if she has a hemorrhoid but has noticed itching.

## 2015-09-03 NOTE — MAU Provider Note (Signed)
Chief Complaint:  Blood In Stools   First Provider Initiated Contact with Patient 09/03/15 2247     HPI: Shirley Greene is a 20 y.o. G1P1001 who presents to maternity admissions reporting Blood in stool today.  Felt sharp pins/needles sensation in anus when going.  Has had this happen before. Has been somewhat constipated. She reports no vaginal bleeding, vaginal itching/burning, urinary symptoms, h/a, dizziness, n/v, or fever/chills.    Rectal Bleeding   The current episode started today. The onset was sudden. The problem has been resolved. The pain is mild. The stool is described as hard and streaked with blood. Associated symptoms include rectal pain. Pertinent negatives include no fever, no nausea, no vaginal bleeding, no vaginal discharge, no headaches, no coughing and no difficulty breathing. She has been behaving normally.   RN note: Pt c/o bright red blood in stool. First noticed on Tuesday-when wiping. States that she did struggle with BM on that day. States normal BM are typically 1-2x per week but has had a BM every since Tuesday. Noticed a lot more blood on toilet paper and in stool after BM today. Denies abdominal pain. Denies taking stool softner or laxative. Has not looked to see if she has a hemorrhoid but has noticed itching   Past Medical History: Past Medical History:  Diagnosis Date  . Anemia   . Chlamydia   . Infection    UTI    Past obstetric history: OB History  Gravida Para Term Preterm AB Living  1 1 1     1   SAB TAB Ectopic Multiple Live Births          1    # Outcome Date GA Lbr Len/2nd Weight Sex Delivery Anes PTL Lv  1 Term 04/05/12 [id number]  3.595 kg (7 lb 14.8 oz) M CS-LTranv EPI  LIV    Obstetric Comments  "OP" presentation, got stuck at 9cm    Past Surgical History: Past Surgical History:  Procedure Laterality Date  . CESAREAN SECTION N/A 04/05/2012   Procedure: CESAREAN SECTION;  Surgeon: Tilda BurrowJohn V Ferguson, MD;  Location: [facility];  Service:  Obstetrics;  Laterality: N/A;    Family History: Family History  Problem Relation Age of Onset  . Hypertension Mother   . Hypertension Father   . Cancer Maternal Grandfather     Social History: Social History  Substance Use Topics  . Smoking status: Never Smoker  . Smokeless tobacco: Never Used  . Alcohol use Yes     Comment: twice a month    Allergies: No Known Allergies  Meds:  Prescriptions Prior to Admission  Medication Sig Dispense Refill Last Dose  . clindamycin (CLEOCIN) 150 MG capsule Take 2 capsules (300 mg total) by mouth 3 (three) times daily. May dispense as 150mg  capsules 60 capsule 0 Past Week at Unknown time  . methocarbamol (ROBAXIN) 500 MG tablet Take 1 tablet (500 mg total) by mouth 2 (two) times daily as needed for muscle spasms. 20 tablet 0 Unknown at Unknown time  . naproxen (NAPROSYN) 500 MG tablet Take 1 tablet (500 mg total) by mouth 2 (two) times daily. 30 tablet 0 Unknown at Unknown time    I have reviewed patient's Past Medical Hx, Surgical Hx, Family Hx, Social Hx, medications and allergies.  ROS:  Review of Systems  Constitutional: Negative for fever.  Respiratory: Negative for cough.   Gastrointestinal: Positive for hematochezia and rectal pain. Negative for nausea.  Genitourinary: Negative for vaginal bleeding and vaginal discharge.  Neurological: Negative for headaches.   Other systems negative     Physical Exam  Patient Vitals for the past 24 hrs:  BP Temp Temp src Pulse Resp SpO2 Height Weight  09/03/15 2132 115/65 98.5 F (36.9 C) Oral 98 18 99 % 5' (1.524 m) 70.3 kg (155 lb)   Constitutional: Well-developed, well-nourished female in no acute distress.  Cardiovascular: normal rate and rhythm, no ectopy audible, S1 & S2 heard, no murmur Respiratory: normal effort, no distress. Lungs CTAB with no wheezes or crackles GI: Abd soft, non-tender.  Nondistended.  No rebound, No guarding.  Bowel Sounds audible  MS: Extremities nontender,  no edema, normal ROM Neurologic: Alert and oriented x 4.   Grossly nonfocal. GU: Neg CVAT. Skin:  Warm and Dry Psych:  Affect appropriate. Rectal:  No obvious hemorrhoid seen.  No large fissures visible    Labs: Results for orders placed or performed during the hospital encounter of 09/03/15 (from the past 72 hour(s))  Urinalysis, Routine w reflex microscopic ([facility])     Status: Abnormal   Collection Time: 09/03/15 10:38 PM  Result Value Ref Range   Color, Urine YELLOW YELLOW   APPearance CLEAR CLEAR   Specific Gravity, Urine >1.030 (H) 1.005 - 1.030   pH 5.5 5.0 - 8.0   Glucose, UA NEGATIVE NEGATIVE mg/dL   Hgb urine dipstick NEGATIVE NEGATIVE   Bilirubin Urine NEGATIVE NEGATIVE   Ketones, ur NEGATIVE NEGATIVE mg/dL   Protein, ur NEGATIVE NEGATIVE mg/dL   Nitrite NEGATIVE NEGATIVE   Leukocytes, UA NEGATIVE NEGATIVE    Comment: MICROSCOPIC NOT DONE ON URINES WITH NEGATIVE PROTEIN, BLOOD, LEUKOCYTES, NITRITE, OR GLUCOSE <1000 mg/dL.  Pregnancy, urine POC     Status: None   Collection Time: 09/03/15 10:41 PM  Result Value Ref Range   Preg Test, Ur NEGATIVE NEGATIVE    Comment:        THE SENSITIVITY OF THIS METHODOLOGY IS >24 mIU/mL     Imaging:  No results found.  MAU Course/MDM: I have ordered labs as follows: see above Imaging ordered: none  Discussed constipation, hemorrhoids and anal fissures.  Discussed need for gastroenterology referral with possible sigmoidoscopy or colonoscopy.  Will need family MD for that referral. Will refer to one.   Pt stable at time of discharge.  Assessment: Rectal bleeding, possible anal fissures vs hemorrhoids Cannot rule out other pathology without a colonoscopy  Plan: Discharge home Recommend high fiber diet, increased water intake, Miralax or fiber laxative prn Rx sent for Colace  for stool softening [facility] Recommend [facility] referral   Encouraged to return here or to other Urgent Care/ED if she  develops worsening of symptoms, increase in pain, fever, or other concerning symptoms.   Wynelle BourgeoisMarie Colbin Jovel CNM, MSN Certified Nurse-Midwife 09/03/2015 10:47 PM

## 2015-11-29 ENCOUNTER — Other Ambulatory Visit (HOSPITAL_COMMUNITY)
Admission: RE | Admit: 2015-11-29 | Discharge: 2015-11-29 | Disposition: A | Discharge status: Home / Self Care | Payer: Medicaid Other | Source: Ambulatory Visit | Attending: Obstetrics & Gynecology | Admitting: Obstetrics & Gynecology

## 2015-11-29 ENCOUNTER — Encounter: Payer: Self-pay | Admitting: Obstetrics & Gynecology

## 2015-11-29 ENCOUNTER — Ambulatory Visit (INDEPENDENT_AMBULATORY_CARE_PROVIDER_SITE_OTHER): Payer: Medicaid Other | Admitting: Obstetrics & Gynecology

## 2015-11-29 VITALS — BP 98/68 | HR 105 | Ht 60.0 in | Wt 161.0 lb

## 2015-11-29 DIAGNOSIS — Z01419 Encounter for gynecological examination (general) (routine) without abnormal findings: Secondary | ICD-10-CM | POA: Diagnosis not present

## 2015-11-29 DIAGNOSIS — Z124 Encounter for screening for malignant neoplasm of cervix: Secondary | ICD-10-CM

## 2015-11-29 DIAGNOSIS — Z1151 Encounter for screening for human papillomavirus (HPV): Secondary | ICD-10-CM | POA: Diagnosis not present

## 2015-11-29 DIAGNOSIS — Z113 Encounter for screening for infections with a predominantly sexual mode of transmission: Secondary | ICD-10-CM | POA: Insufficient documentation

## 2015-11-29 DIAGNOSIS — Z01411 Encounter for gynecological examination (general) (routine) with abnormal findings: Secondary | ICD-10-CM | POA: Insufficient documentation

## 2015-11-29 DIAGNOSIS — Z23 Encounter for immunization: Secondary | ICD-10-CM

## 2015-11-29 MED ORDER — NORGESTREL-ETHINYL ESTRADIOL 0.3-30 MG-MCG PO TABS: 1.0000 | ORAL_TABLET | Freq: Every day | ORAL | 11 refills | Status: DC

## 2015-11-29 NOTE — Progress Notes (Signed)
Subjective:    Shirley Greene is a 20 y.o. S AAP1(22 yo son) female who presents for an annual exam. The patient has no complaints today. The patient is not currently sexually active. GYN screening history: last pap: was normal. The patient wears seatbelts: yes. The patient participates in regular exercise: no. Has the patient ever been transfused or tattooed?: yes. The patient reports that there is not domestic violence in her life.   Menstrual History: OB History    Gravida Para Term Preterm AB Living   1 1 1     1    SAB TAB Ectopic Multiple Live Births           1      Obstetric Comments   "OP" presentation, got stuck at 9cm      Menarche age: 89 Patient's last menstrual period was 11/11/2015.    The following portions of the patient's history were reviewed and updated as appropriate: allergies, current medications, past family history, past medical history, past social history, past surgical history and problem list.  Review of Systems Pertinent items are noted in HPI.   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 Abstinent for "months" Uses condoms prn FH- No breast/gyn/colon cancer Need Gardasil    Objective:    BP 98/68   Pulse (!) 105   Ht 5' (1.524 m)   Wt 161 lb (73 kg)   LMP 11/11/2015   BMI 31.44 kg/m   General Appearance:    Alert, cooperative, no distress, appears stated age  Head:    Normocephalic, without obvious abnormality, atraumatic  Eyes:    PERRL, conjunctiva/corneas clear, EOM's intact, fundi    benign, both eyes  Ears:    Normal TM's and external ear canals, both ears  Nose:   Nares normal, septum midline, mucosa normal, no drainage    or sinus tenderness  Throat:   Lips, mucosa, and tongue normal; teeth and gums normal  Neck:   Supple, symmetrical, trachea midline, no adenopathy;    thyroid:  no enlargement/tenderness/nodules; no carotid   bruit or JVD  Back:     Symmetric, no curvature, ROM normal, no CVA tenderness  Lungs:      Clear to auscultation bilaterally, respirations unlabored  Chest Wall:    No tenderness or deformity   Heart:    Regular rate and rhythm, S1 and S2 normal, no murmur, rub   or gallop  Breast Exam:    No tenderness, masses, or nipple abnormality  Abdomen:     Soft, non-tender, bowel sounds active all four quadrants,    no masses, no organomegaly  Genitalia:    Normal female without lesion, discharge or tenderness, NSSA, NT, mobile, normal adnexal exam     Extremities:   Extremities normal, atraumatic, no cyanosis or edema  Pulses:   2+ and symmetric all extremities  Skin:   Skin color, texture, turgor normal, no rashes or lesions  Lymph nodes:   Cervical, supraclavicular, and axillary nodes normal  Neurologic:   CNII-XII intact, normal strength, sensation and reflexes    throughout   .    Assessment:    Healthy female exam.   Contraception   Plan:     Thin prep Pap smear.  with STI testing Start Gardasil today Declines flu vaccine Start Lo ovral with NMP, rec back up for a week

## 2015-11-30 LAB — HEPATITIS B SURFACE ANTIGEN: Hepatitis B Surface Ag: NEGATIVE

## 2015-11-30 LAB — HIV ANTIBODY (ROUTINE TESTING W REFLEX): HIV 1&2 Ab, 4th Generation: NONREACTIVE

## 2015-11-30 LAB — RPR

## 2015-11-30 LAB — HEPATITIS C ANTIBODY: HCV Ab: NEGATIVE

## 2015-12-01 LAB — CYTOLOGY - PAP
CHLAMYDIA, DNA PROBE: NEGATIVE
Neisseria Gonorrhea: NEGATIVE

## 2015-12-06 ENCOUNTER — Telehealth: Payer: Self-pay | Admitting: *Deleted

## 2015-12-06 NOTE — Telephone Encounter (Signed)
Informed pt of pap smear result and need for colposcopy, scheduled appt for 12-23-15 at 0945.

## 2015-12-06 NOTE — Telephone Encounter (Signed)
-----   Message from Arne ClevelandMandy J Hutchinson, [place] sent at 12/06/2015  8:41 AM EST ----- Baxter HireKristen, please call this one as well.   Thanks MH ----- Message ----- From: Allie BossierMyra C Dove, MD Sent: 12/02/2015   9:46 AM To: Arne ClevelandMandy J Hutchinson, CMA  She will need a colpo. Thanks

## 2015-12-23 ENCOUNTER — Ambulatory Visit (INDEPENDENT_AMBULATORY_CARE_PROVIDER_SITE_OTHER): Payer: Medicaid Other | Admitting: Obstetrics & Gynecology

## 2015-12-23 ENCOUNTER — Encounter: Payer: Self-pay | Admitting: Obstetrics & Gynecology

## 2015-12-23 VITALS — BP 97/64 | HR 97 | Resp 18 | Wt 159.0 lb

## 2015-12-23 DIAGNOSIS — R87612 Low grade squamous intraepithelial lesion on cytologic smear of cervix (LGSIL): Secondary | ICD-10-CM | POA: Diagnosis not present

## 2015-12-23 DIAGNOSIS — Z01812 Encounter for preprocedural laboratory examination: Secondary | ICD-10-CM | POA: Diagnosis not present

## 2015-12-23 DIAGNOSIS — Z3202 Encounter for pregnancy test, result negative: Secondary | ICD-10-CM

## 2015-12-23 LAB — POCT URINE PREGNANCY: Preg Test, Ur: NEGATIVE

## 2015-12-23 NOTE — Progress Notes (Signed)
   Subjective:    Patient ID: Shirley Greene, female    DOB: July 02, 1995, 20 y.o.   MRN: [id number]  HPI  20 yo S AA lady here for a colpo due to LGSIL pap. She has had 3 partners in her lifetime  Review of Systems     Objective:   Physical Exam  UPT negative, consent signed, time out done Cervix prepped with acetic acid. Transformation zone seen in its entirety. Colpo adequate. No evidence of CIN ECC obtained. She tolerated the procedure well.    Assessment & Plan:  LGSIL pap, normal colpo- await ECC. If ECC is negative, then pap in 1 year Gardasil #2 due next month

## 2015-12-27 ENCOUNTER — Encounter: Payer: Self-pay | Admitting: *Deleted

## 2016-01-31 ENCOUNTER — Ambulatory Visit: Payer: Medicaid Other

## 2016-02-03 ENCOUNTER — Ambulatory Visit: Payer: Medicaid Other

## 2016-02-14 ENCOUNTER — Emergency Department (HOSPITAL_COMMUNITY)
Admission: EM | Admit: 2016-02-14 | Discharge: 2016-02-14 | Disposition: A | Discharge status: Home / Self Care | Payer: Medicaid Other | Attending: Emergency Medicine | Admitting: Emergency Medicine

## 2016-02-14 ENCOUNTER — Encounter (HOSPITAL_COMMUNITY): Payer: Self-pay | Admitting: *Deleted

## 2016-02-14 DIAGNOSIS — R109 Unspecified abdominal pain: Secondary | ICD-10-CM

## 2016-02-14 DIAGNOSIS — R350 Frequency of micturition: Secondary | ICD-10-CM | POA: Diagnosis not present

## 2016-02-14 DIAGNOSIS — R1031 Right lower quadrant pain: Secondary | ICD-10-CM | POA: Diagnosis not present

## 2016-02-14 LAB — COMPREHENSIVE METABOLIC PANEL
ALT: 16 U/L (ref 14–54)
AST: 19 U/L (ref 15–41)
Albumin: 4 g/dL (ref 3.5–5.0)
Alkaline Phosphatase: 53 U/L (ref 38–126)
Anion gap: 7 (ref 5–15)
BUN: 7 mg/dL (ref 6–20)
CO2: 26 mmol/L (ref 22–32)
Calcium: 9.3 mg/dL (ref 8.9–10.3)
Chloride: 104 mmol/L (ref 101–111)
Creatinine, Ser: 0.77 mg/dL (ref 0.44–1.00)
GFR calc Af Amer: >60 mL/min (ref >60)
GFR calc non Af Amer: >60 mL/min (ref >60)
Glucose, Bld: 115 mg/dL — ABNORMAL HIGH (ref 65–99)
Potassium: 4.1 mmol/L (ref 3.5–5.1)
Sodium: 137 mmol/L (ref 135–145)
Total Bilirubin: 0.8 mg/dL (ref 0.3–1.2)
Total Protein: 7.3 g/dL (ref 6.5–8.1)

## 2016-02-14 LAB — URINALYSIS, ROUTINE W REFLEX MICROSCOPIC
Bilirubin Urine: NEGATIVE
Glucose, UA: NEGATIVE mg/dL
Hgb urine dipstick: NEGATIVE
Ketones, ur: NEGATIVE mg/dL
Leukocytes, UA: NEGATIVE
Nitrite: NEGATIVE
Protein, ur: NEGATIVE mg/dL
Specific Gravity, Urine: 1.02 (ref 1.005–1.030)
pH: 7 (ref 5.0–8.0)

## 2016-02-14 LAB — CBC
HCT: 42.2 % (ref 36.0–46.0)
Hemoglobin: 13.3 g/dL (ref 12.0–15.0)
MCH: 24.1 pg — ABNORMAL LOW (ref 26.0–34.0)
MCHC: 31.5 g/dL (ref 30.0–36.0)
MCV: 76.4 fL — ABNORMAL LOW (ref 78.0–100.0)
Platelets: 327 10*3/uL (ref 150–400)
RBC: 5.52 MIL/uL — ABNORMAL HIGH (ref 3.87–5.11)
RDW: 15.4 % (ref 11.5–15.5)
WBC: 7.3 10*3/uL (ref 4.0–10.5)

## 2016-02-14 LAB — LIPASE, BLOOD: Lipase: 19 U/L (ref 11–51)

## 2016-02-14 LAB — I-STAT BETA HCG BLOOD, ED (MC, WL, AP ONLY): I-stat hCG, quantitative: <5 m[IU]/mL (ref <5)

## 2016-02-14 NOTE — ED Provider Notes (Signed)
[facility]-EMERGENCY DEPT Provider Note   CSN: [id number] Arrival date & time: 02/14/16  [id number]    By signing my name below, I, Shirley Greene, attest that this documentation has been prepared under the direction and in the presence of Shirley RazorStephen Laverle Pillard, MD . Electronically Signed: Freida Busmaniana Greene, Scribe. 02/14/2016. 12:21 PM.  History   Chief Complaint Chief Complaint  Patient presents with  . Abdominal Pain    The history is provided by the patient. No language interpreter was used.    HPI Comments:  [name] is a 21 y.o. female who presents to the Emergency Department complaining of gradual onset, back pain x 3 days. She reports associated abdominal pain and urinary frequency. She reports h/o similar symptoms with past UTI. She has been drinking cranberry juice without relief. No other treatments tried.  No fever, chills,or abnormal vaginal bleeding or discharge. She notes her menstrual period is 5 days late.   Past Medical History:  Diagnosis Date  . Anemia   . Chlamydia   . Infection    UTI    Patient Active Problem List   Diagnosis Date Noted  . 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 Status post primary low transverse cesarean section 04/10/2012    Past Surgical History:  Procedure Laterality Date  . CESAREAN SECTION N/A 04/05/2012   Procedure: CESAREAN SECTION;  Surgeon: Tilda BurrowJohn V Ferguson, MD;  Location: [facility];  Service: Obstetrics;  Laterality: N/A;    OB History    Gravida Para Term Preterm AB Living   1 1 1     1    SAB TAB Ectopic Multiple Live Births           1      Obstetric Comments   "OP" presentation, got stuck at 9cm       Home Medications    Prior to Admission medications   Medication Sig Start Date End Date Taking? Authorizing Provider  norgestrel-ethinyl estradiol (LO/OVRAL,CRYSELLE) 0.3-30 MG-MCG tablet Take 1 tablet by mouth daily. Patient not taking: Reported on 12/23/2015 11/29/15   Shirley BossierMyra C Dove, MD     Family History Family History  Problem Relation Age of Onset  . Hypertension Mother   . Hypertension Father   . Cancer Maternal Grandfather     Social History Social History  Substance Use Topics  . Smoking status: Never Smoker  . Smokeless tobacco: Never Used  . Alcohol use Yes     Comment: twice a month     Allergies   Patient has no known allergies.   Review of Systems Review of Systems  Constitutional: Negative for chills and fever.  Gastrointestinal: Positive for abdominal pain. Negative for vomiting.  Genitourinary: Positive for frequency. Negative for vaginal bleeding and vaginal discharge.  Musculoskeletal: Positive for back pain.  All other systems reviewed and are negative.    Physical Exam Updated Vital Signs BP 116/72 (BP Location: Right Arm)   Pulse 103   Temp 98 F (36.7 C) (Oral)   Resp 16   LMP 01/11/2016   SpO2 99%   Physical Exam  Constitutional: She is oriented to person, place, and time. She appears well-developed and well-nourished. No distress.  HENT:  Head: Normocephalic and atraumatic.  Eyes: EOM are normal.  Neck: Normal range of motion.  Cardiovascular: Normal rate, regular rhythm and normal heart sounds.   Pulmonary/Chest: Effort normal and breath sounds normal.  Abdominal: Soft. She exhibits no distension. There is no tenderness. There is no  CVA tenderness.  Musculoskeletal: Normal range of motion.  Neurological: She is alert and oriented to person, place, and time.  Skin: Skin is warm and dry.  Psychiatric: She has a normal mood and affect. Judgment normal.  Nursing note and vitals reviewed.    ED Treatments / Results  DIAGNOSTIC STUDIES:  Oxygen Saturation is 9% on RA, normal by my interpretation.    COORDINATION OF CARE:  12:21 PM Discussed treatment plan with pt at bedside and pt agreed to plan.  Labs (all labs ordered are listed, but only abnormal results are displayed) Labs Reviewed  COMPREHENSIVE METABOLIC  PANEL - Abnormal; Notable for the following:       Result Value   Glucose, Bld 115 (*)    All other components within normal limits  CBC - Abnormal; Notable for the following:    RBC 5.52 (*)    MCV 76.4 (*)    MCH 24.1 (*)    All other components within normal limits  URINE CULTURE  LIPASE, BLOOD  URINALYSIS, ROUTINE W REFLEX MICROSCOPIC  I-STAT BETA HCG BLOOD, ED ([facility], [facility], [facility] ONLY)  GC/CHLAMYDIA PROBE AMP () NOT AT [facility]    EKG  EKG Interpretation None       Radiology No results found.  Procedures Procedures (including critical care time)  Medications Ordered in ED Medications - No data to display   Initial Impression / Assessment and Plan / ED Course  I have reviewed the triage vital signs and the nursing notes.  Pertinent labs & imaging results that were available during my care of the patient were reviewed by me and considered in my medical decision making (see chart for details).     22 year old female with right lower back/flank pain for the last few days. Increased urinary frequency. Symptoms concerning for possible urinary tract infection. Will check UA. Her exam is nonfocal. She is generally well-appearing hemodynamically stable. She reports that she is late for her period, her pregnancy test today is negative. She denies any vaginal bleeding or discharge.  1:08 PM Urinalysis noted. Based on this, I would not treat for  UTI. Because her symptoms though, will send urine culture. Discussed with patient other causes of potential urethritis. She is declining pelvic examination. We'll send gonorrhea and Chlamydia testing based on her urine though. She is declining empiric treatment. She understands that she should be contacted if this is positive. Also offered a prescription of Pyridium which she is also declining. Return precautions were discussed. Outpatient follow-up otherwise. Symptoms a little atypical for ureteral colic. There is no blood in her urine to  suggest this either. Doubt cholelithiasis. Doubt appendicitis or other acute surgical process.    Final Clinical Impressions(s) / ED Diagnoses   Final diagnoses:  Right flank pain    New Prescriptions New Prescriptions   No medications on file   I personally preformed the services scribed in my presence. The recorded information has been reviewed is accurate. Shirley Razor, MD.     Shirley Razor, MD 02/14/16 1310

## 2016-02-14 NOTE — ED Notes (Signed)
ED Provider at bedside. 

## 2016-02-14 NOTE — ED Triage Notes (Signed)
Pt reports abdominal cramping and frequency for 3 days.

## 2016-02-15 LAB — URINE CULTURE

## 2016-02-15 LAB — GC/CHLAMYDIA PROBE AMP (~~LOC~~) NOT AT ARMC
Chlamydia: NEGATIVE
Neisseria Gonorrhea: NEGATIVE

## 2016-02-21 ENCOUNTER — Encounter (HOSPITAL_COMMUNITY): Payer: Self-pay | Admitting: *Deleted

## 2016-02-21 ENCOUNTER — Inpatient Hospital Stay (HOSPITAL_COMMUNITY)
Admission: AD | Admit: 2016-02-21 | Discharge: 2016-02-21 | Disposition: A | Discharge status: Home / Self Care | Payer: Medicaid Other | Source: Ambulatory Visit | Attending: Obstetrics & Gynecology | Admitting: Obstetrics & Gynecology

## 2016-02-21 DIAGNOSIS — Z87440 Personal history of urinary (tract) infections: Secondary | ICD-10-CM | POA: Insufficient documentation

## 2016-02-21 DIAGNOSIS — R110 Nausea: Secondary | ICD-10-CM | POA: Diagnosis not present

## 2016-02-21 DIAGNOSIS — R109 Unspecified abdominal pain: Secondary | ICD-10-CM | POA: Diagnosis present

## 2016-02-21 DIAGNOSIS — Z8249 Family history of ischemic heart disease and other diseases of the circulatory system: Secondary | ICD-10-CM | POA: Diagnosis not present

## 2016-02-21 DIAGNOSIS — Z8619 Personal history of other infectious and parasitic diseases: Secondary | ICD-10-CM | POA: Diagnosis not present

## 2016-02-21 DIAGNOSIS — R1031 Right lower quadrant pain: Secondary | ICD-10-CM

## 2016-02-21 DIAGNOSIS — R1032 Left lower quadrant pain: Secondary | ICD-10-CM | POA: Diagnosis not present

## 2016-02-21 DIAGNOSIS — N911 Secondary amenorrhea: Secondary | ICD-10-CM | POA: Insufficient documentation

## 2016-02-21 LAB — CBC WITH DIFFERENTIAL/PLATELET
BASOS PCT: 0 %
Basophils Absolute: 0 10*3/uL (ref 0.0–0.1)
Eosinophils Absolute: 0.2 10*3/uL (ref 0.0–0.7)
Eosinophils Relative: 2 %
HEMATOCRIT: 38.4 % (ref 36.0–46.0)
HEMOGLOBIN: 12.4 g/dL (ref 12.0–15.0)
LYMPHS PCT: 36 %
Lymphs Abs: 3.2 10*3/uL (ref 0.7–4.0)
MCH: 24 pg — ABNORMAL LOW (ref 26.0–34.0)
MCHC: 32.3 g/dL (ref 30.0–36.0)
MCV: 74.3 fL — AB (ref 78.0–100.0)
MONO ABS: 0.4 10*3/uL (ref 0.1–1.0)
Monocytes Relative: 5 %
NEUTROS ABS: 5 10*3/uL (ref 1.7–7.7)
NEUTROS PCT: 57 %
Platelets: 356 10*3/uL (ref 150–400)
RBC: 5.17 MIL/uL — AB (ref 3.87–5.11)
RDW: 15.7 % — AB (ref 11.5–15.5)
WBC: 8.8 10*3/uL (ref 4.0–10.5)

## 2016-02-21 LAB — URINALYSIS, ROUTINE W REFLEX MICROSCOPIC
Bilirubin Urine: NEGATIVE
Glucose, UA: NEGATIVE mg/dL
HGB URINE DIPSTICK: NEGATIVE
Ketones, ur: NEGATIVE mg/dL
Nitrite: NEGATIVE
PROTEIN: NEGATIVE mg/dL
SPECIFIC GRAVITY, URINE: 1.019 (ref 1.005–1.030)
pH: 7 (ref 5.0–8.0)

## 2016-02-21 LAB — WET PREP, GENITAL
CLUE CELLS WET PREP: NONE SEEN
Sperm: NONE SEEN
TRICH WET PREP: NONE SEEN
Yeast Wet Prep HPF POC: NONE SEEN

## 2016-02-21 LAB — POCT PREGNANCY, URINE: Preg Test, Ur: NEGATIVE

## 2016-02-21 MED ORDER — KETOROLAC TROMETHAMINE 60 MG/2ML IM SOLN
60.0000 mg | Freq: Once | INTRAMUSCULAR | Status: AC
  Administered 2016-02-21: 60 mg via INTRAMUSCULAR
  Filled 2016-02-21: qty 2

## 2016-02-21 MED ORDER — ONDANSETRON HCL 4 MG PO TABS: 4.0000 mg | ORAL_TABLET | Freq: Three times a day (TID) | ORAL | 1 refills | Status: DC | PRN

## 2016-02-21 MED ORDER — IBUPROFEN 600 MG PO TABS: 600.0000 mg | ORAL_TABLET | Freq: Four times a day (QID) | ORAL | 2 refills | Status: DC | PRN

## 2016-02-21 NOTE — MAU Provider Note (Signed)
Chief Complaint: Abdominal Pain and Nausea   First Provider Initiated Contact with Patient 02/21/16 2246     SUBJECTIVE HPI: Shirley Greene is a 21 y.o. 191P1001 female who presents to Maternity Admissions reporting low abd pain and nausea x 4 days. 2 weeks late for period. LMP 01/12/16.   Location: suprapubic Quality: cramping Severity: 5/10 on pain scale Duration: 4 days Context: late period Timing: intermittent Modifying factors: None. Hasn't tried anything for the pain Associated signs and symptoms: Pos for nausea. Neg for fever, chills, vaginal bleeding, vaginal discharge, vomiting, diarrhea, contacts, urinary complaints.   Past Medical History:  Diagnosis Date  . Anemia   . Chlamydia   . Infection    UTI   OB History  Gravida Para Term Preterm AB Living  1 1 1     1   SAB TAB Ectopic Multiple Live Births          1    # Outcome Date GA Lbr Len/2nd Weight Sex Delivery Anes PTL Lv  1 Term 04/05/12 [id number]  7 lb 14.8 oz (3.595 kg) M CS-LTranv EPI  LIV    Obstetric Comments  "OP" presentation, got stuck at 9cm   Past Surgical History:  Procedure Laterality Date  . CESAREAN SECTION N/A 04/05/2012   Procedure: CESAREAN SECTION;  Surgeon: Tilda BurrowJohn V Ferguson, MD;  Location: [facility];  Service: Obstetrics;  Laterality: N/A;   Social History   Social History  . Marital status: Single    Spouse name: N/A  . Number of children: N/A  . Years of education: N/A   Occupational History  . Not on file.   Social History Main Topics  . Smoking status: Never Smoker  . Smokeless tobacco: Never Used  . Alcohol use Yes     Comment: twice a month  . Drug use: No  . Sexual activity: Yes    Birth control/ protection: None   Other Topics Concern  . Not on file   Social History Narrative  . No narrative on file   Family History  Problem Relation Age of Onset  . Hypertension Mother   . Hypertension Father   . Cancer Maternal Grandfather    No current facility-administered  medications on file prior to encounter.    Current Outpatient Prescriptions on File Prior to Encounter  Medication Sig Dispense Refill  . norgestrel-ethinyl estradiol (LO/OVRAL,CRYSELLE) 0.3-30 MG-MCG tablet Take 1 tablet by mouth daily. (Patient not taking: Reported on 12/23/2015) 1 Package 11   No Known Allergies  I have reviewed patient's Past Medical Hx, Surgical Hx, Family Hx, Social Hx, medications and allergies.   Review of Systems  Constitutional: Negative for appetite change, chills and fever.  Gastrointestinal: Positive for abdominal pain and nausea. Negative for abdominal distention, constipation, diarrhea and vomiting.  Genitourinary: Positive for menstrual problem. Negative for dysuria, flank pain, frequency, hematuria, urgency, vaginal bleeding and vaginal discharge.  Musculoskeletal: Negative for back pain.    OBJECTIVE Patient Vitals for the past 24 hrs:  BP Temp Temp src Pulse Resp Height Weight  02/21/16 2308 115/67 98.4 F (36.9 C) Oral 97 18 - -  02/21/16 2026 122/71 98.1 F (36.7 C) Oral 113 18 5' (1.524 m) 166 lb 12 oz (75.6 kg)   Constitutional: Well-developed, well-nourished female in no acute distress.  Cardiovascular: normal rate Respiratory: normal rate and effort.  GI: Abd soft, non-tender. Pos BS x 4 MS: Extremities nontender, no edema, normal ROM Neurologic: Alert and oriented x 4.  GU: Neg CVAT.  PELVIC EXAM: NEFG, physiologic discharge, no blood noted, cervix closed, uterus normal size, no adnexal tenderness or masses. No CMT.  LAB RESULTS Results for orders placed or performed during the hospital encounter of 02/21/16 (from the past 24 hour(s))  Urinalysis, Routine w reflex microscopic     Status: Abnormal   Collection Time: 02/21/16  8:24 PM  Result Value Ref Range   Color, Urine YELLOW YELLOW   APPearance HAZY (A) CLEAR   Specific Gravity, Urine 1.019 1.005 - 1.030   pH 7.0 5.0 - 8.0   Glucose, UA NEGATIVE NEGATIVE mg/dL   Hgb urine  dipstick NEGATIVE NEGATIVE   Bilirubin Urine NEGATIVE NEGATIVE   Ketones, ur NEGATIVE NEGATIVE mg/dL   Protein, ur NEGATIVE NEGATIVE mg/dL   Nitrite NEGATIVE NEGATIVE   Leukocytes, UA TRACE (A) NEGATIVE   RBC / HPF 0-5 0 - 5 RBC/hpf   WBC, UA 0-5 0 - 5 WBC/hpf   Bacteria, UA RARE (A) NONE SEEN   Squamous Epithelial / LPF 0-5 (A) NONE SEEN  Pregnancy, urine POC     Status: None   Collection Time: 02/21/16  8:35 PM  Result Value Ref Range   Preg Test, Ur NEGATIVE NEGATIVE  Wet prep, genital     Status: Abnormal   Collection Time: 02/21/16  9:40 PM  Result Value Ref Range   Yeast Wet Prep HPF POC NONE SEEN NONE SEEN   Trich, Wet Prep NONE SEEN NONE SEEN   Clue Cells Wet Prep HPF POC NONE SEEN NONE SEEN   WBC, Wet Prep HPF POC MODERATE (A) NONE SEEN   Sperm NONE SEEN   CBC with Differential/Platelet     Status: Abnormal   Collection Time: 02/21/16  9:46 PM  Result Value Ref Range   WBC 8.8 4.0 - 10.5 K/uL   RBC 5.17 (H) 3.87 - 5.11 MIL/uL   Hemoglobin 12.4 12.0 - 15.0 g/dL   HCT 16.1 09.6 - 04.5 %   MCV 74.3 (L) 78.0 - 100.0 fL   MCH 24.0 (L) 26.0 - 34.0 pg   MCHC 32.3 30.0 - 36.0 g/dL   RDW 40.9 (H) 81.1 - 91.4 %   Platelets 356 150 - 400 K/uL   Neutrophils Relative % 57 %   Neutro Abs 5.0 1.7 - 7.7 K/uL   Lymphocytes Relative 36 %   Lymphs Abs 3.2 0.7 - 4.0 K/uL   Monocytes Relative 5 %   Monocytes Absolute 0.4 0.1 - 1.0 K/uL   Eosinophils Relative 2 %   Eosinophils Absolute 0.2 0.0 - 0.7 K/uL   Basophils Relative 0 %   Basophils Absolute 0.0 0.0 - 0.1 K/uL    IMAGING No results found.  MAU COURSE Orders Placed This Encounter  Procedures  . Wet prep, genital  . Urinalysis, Routine w reflex microscopic  . HIV antibody (routine testing) (NOT for [facility])  . CBC with Differential/Platelet  . Lab instructions  . Pregnancy, urine POC  . Discharge patient   Meds ordered this encounter  Medications  . ketorolac (TORADOL) injection 60 mg  . ibuprofen (ADVIL,MOTRIN)  600 MG tablet    Sig: Take 1 tablet (600 mg total) by mouth every 6 (six) hours as needed.    Dispense:  30 tablet    Refill:  2    Order Specific Question:   Supervising Provider    Answer:   Willodean Rosenthal [id number]  . ondansetron (ZOFRAN) 4 MG tablet    Sig: Take 1 tablet (4 mg total) by  mouth every 8 (eight) hours as needed for nausea or vomiting.    Dispense:  20 tablet    Refill:  1    Order Specific Question:   Supervising Provider    Answer:   Willodean Rosenthal [[id number]]    Pain resolved.   MDM - Low abd pain w/ secondary amenorrhea, nausea and neg UPT. Suspect dysmenorrhea vs GI etiology of pain. No acute abd or evidence of emergent condition.   ASSESSMENT 1. Abdominal pain, acute, bilateral lower quadrant   2. Secondary amenorrhea   3. Nausea without vomiting     PLAN Discharge home in stable condition. Abd pain precautions If no menstrual period in next month take home UPT.  GC/Chlamydia cultures pending.  Follow-up Information    [facility] for [facility] Follow up.   Specialty:  Obstetrics and Gynecology Why:  as needed if no mentstrual period in one month Contact information: [address] [employer] [place] [address] [phone]       [facility] [place] MATERNITY ADMISSIONS Follow up.   Why:  as needed in Ob/Gyn emergencies  Contact information: [address] [id number] mc [place] [address] [phone]         Allergies as of 02/21/2016   No Known Allergies     Medication List    STOP taking these medications   norgestrel-ethinyl estradiol 0.3-30 MG-MCG tablet Commonly known as:  LO/OVRAL,CRYSELLE     TAKE these medications   ibuprofen 600 MG tablet Commonly known as:  ADVIL,MOTRIN Take 1 tablet (600 mg total) by mouth every 6 (six) hours as needed.   ondansetron 4 MG tablet Commonly known as:  ZOFRAN Take 1 tablet (4 mg total) by mouth every 8 (eight)  hours as needed for nausea or vomiting.        [place], CNM 02/21/2016  11:06 PM

## 2016-02-21 NOTE — Discharge Instructions (Signed)
Acute Pain, Adult Acute pain is a type of pain that may last for just a few days or as long as six months. It is often related to an illness, injury, or medical procedure. Acute pain may be mild, moderate, or severe. It usually goes away once your injury has healed or you are no longer ill. Pain can make it hard for you to do daily activities. It can cause anxiety and lead to other problems if left untreated. Treatment depends on the cause and severity of your acute pain. Follow these instructions at home:  Check your pain level as told by your health care provider.  Take over-the-counter and prescription medicines only as told by your health care provider.  If you are taking prescription pain medicine:  Ask your health care provider about taking a stool softener or laxative to prevent constipation.  Do not stop taking the medicine suddenly. Talk to your health care provider about how and when to discontinue prescription pain medicine.  If your pain is severe, do not take more pills than instructed by your health care provider.  Do not take other over-the-counter pain medicines in addition to this medicine unless told by your health care provider.  Do not drive or operate heavy machinery while taking prescription pain medicine.  Apply ice or heat as told by your health care provider. These may reduce swelling and pain.  Ask your health care provider if other strategies such as distraction, relaxation, or physical therapies can help your pain.  Keep all follow-up visits as told by your health care provider. This is important. Contact a health care provider if:  You have pain that is not controlled by medicine.  Your pain does not improve or gets worse.  You have side effects from pain medicines, such as vomitingor confusion. Get help right away if:  You have severe pain.  You have trouble breathing.  You lose consciousness.  You have chest pain or pressure that lasts for more  than a few minutes. Along with the chest pain you may:  Have pain or discomfort in one or both arms, your back, neck, jaw, or stomach.  Have shortness of breath.  Break out in a cold sweat.  Feel nauseous.  Become light-headed. These symptoms may represent a serious problem that is an emergency. Do not wait to see if the symptoms will go away. Get medical help right away. Call your local emergency services (911 in the [place] Do not drive yourself to the hospital.  This information is not intended to replace advice given to you by your health care provider. Make sure you discuss any questions you have with your health care provider. Document Released: 01/17/2015 Document Revised: 06/11/2015 Document Reviewed: 01/17/2015 Elsevier Interactive Patient Education  2017 [employer]  Secondary Amenorrhea Secondary amenorrhea is the stopping of menstrual flow for 3-6 months in a female who has previously had periods. There are many possible causes. Most of these causes are not serious. Usually, treating the underlying problem causing the loss of menses will return your periods to normal. What are the causes? Some common and uncommon causes of not menstruating include:  Malnutrition.  Low blood sugar (hypoglycemia).  Polycystic ovary disease.  Stress or fear.  Breastfeeding.  Hormone imbalance.  Ovarian failure.  Medicines.  Extreme obesity.  Cystic fibrosis.  Low body weight or drastic weight reduction from any cause.  Early menopause.  Removal of ovaries or uterus.  Contraceptives.  Illness.  Long-term (chronic) illnesses.  Cushing syndrome.  Thyroid problems.  Birth control pills, patches, or vaginal rings for birth control. What increases the risk? You may be at greater risk of secondary amenorrhea if:  You have a family history of this condition.  You have an eating disorder.  You do athletic training. How is this diagnosed? A diagnosis is made by your  health care provider taking a medical history and doing a physical exam. This will include a pelvic exam to check for problems with your reproductive organs. Pregnancy must be ruled out. Often, numerous blood tests are done to measure different hormones in the body. Urine testing may be done. Specialized exams (ultrasound, CT scan, MRI, or hysteroscopy) may have to be done as well as measuring the body mass index (BMI). How is this treated? Treatment depends on the cause of the amenorrhea. If an eating disorder is present, this can be treated with an adequate diet and therapy. Chronic illnesses may improve with treatment of the illness. Amenorrhea may be corrected with medicines, lifestyle changes, or surgery. If the amenorrhea cannot be corrected, it is sometimes possible to create a false menstruation with medicines. Follow these instructions at home:  Maintain a healthy diet.  Manage weight problems.  Exercise regularly but not excessively.  Get adequate sleep.  Manage stress.  Be aware of changes in your menstrual cycle. Keep a record of when your periods occur. Note the date your period starts, how long it lasts, and any problems. Contact a health care provider if: Your symptoms do not get better with treatment. This information is not intended to replace advice given to you by your health care provider. Make sure you discuss any questions you have with your health care provider. Document Released: 02/13/2006 Document Revised: 06/10/2015 Document Reviewed: 06/20/2012 Elsevier Interactive Patient Education  2017 [employer].

## 2016-02-21 NOTE — MAU Note (Signed)
Pt presents complaining of abdominal pain and nausea for 4 days. LMP 01/12/16. [facility]. Denies vaginal bleeding or abnormal discharge.

## 2016-02-22 ENCOUNTER — Encounter (HOSPITAL_COMMUNITY): Payer: Self-pay | Admitting: Advanced Practice Midwife

## 2016-02-22 LAB — GC/CHLAMYDIA PROBE AMP (~~LOC~~) NOT AT ARMC
CHLAMYDIA, DNA PROBE: NEGATIVE
NEISSERIA GONORRHEA: NEGATIVE

## 2016-02-23 LAB — HIV ANTIBODY (ROUTINE TESTING W REFLEX): HIV SCREEN 4TH GENERATION: NONREACTIVE

## 2016-02-24 ENCOUNTER — Ambulatory Visit: Payer: Medicaid Other | Admitting: Obstetrics & Gynecology

## 2016-05-13 ENCOUNTER — Inpatient Hospital Stay (HOSPITAL_COMMUNITY)
Admission: AD | Admit: 2016-05-13 | Discharge: 2016-05-14 | Disposition: A | Discharge status: Home / Self Care | Payer: Medicaid Other | Source: Ambulatory Visit | Attending: Obstetrics and Gynecology | Admitting: Obstetrics and Gynecology

## 2016-05-13 DIAGNOSIS — N760 Acute vaginitis: Secondary | ICD-10-CM | POA: Insufficient documentation

## 2016-05-13 DIAGNOSIS — Z8249 Family history of ischemic heart disease and other diseases of the circulatory system: Secondary | ICD-10-CM | POA: Insufficient documentation

## 2016-05-13 DIAGNOSIS — B9689 Other specified bacterial agents as the cause of diseases classified elsewhere: Secondary | ICD-10-CM | POA: Insufficient documentation

## 2016-05-13 DIAGNOSIS — Z87440 Personal history of urinary (tract) infections: Secondary | ICD-10-CM | POA: Insufficient documentation

## 2016-05-14 ENCOUNTER — Encounter (HOSPITAL_COMMUNITY): Payer: Self-pay | Admitting: *Deleted

## 2016-05-14 DIAGNOSIS — B9689 Other specified bacterial agents as the cause of diseases classified elsewhere: Secondary | ICD-10-CM | POA: Diagnosis not present

## 2016-05-14 DIAGNOSIS — N760 Acute vaginitis: Secondary | ICD-10-CM | POA: Diagnosis not present

## 2016-05-14 DIAGNOSIS — N898 Other specified noninflammatory disorders of vagina: Secondary | ICD-10-CM | POA: Diagnosis present

## 2016-05-14 DIAGNOSIS — Z87440 Personal history of urinary (tract) infections: Secondary | ICD-10-CM | POA: Diagnosis not present

## 2016-05-14 DIAGNOSIS — Z8249 Family history of ischemic heart disease and other diseases of the circulatory system: Secondary | ICD-10-CM | POA: Diagnosis not present

## 2016-05-14 LAB — WET PREP, GENITAL
Sperm: NONE SEEN
TRICH WET PREP: NONE SEEN
Yeast Wet Prep HPF POC: NONE SEEN

## 2016-05-14 MED ORDER — METRONIDAZOLE 500 MG PO TABS: 500.0000 mg | ORAL_TABLET | Freq: Two times a day (BID) | ORAL | 0 refills | Status: DC

## 2016-05-14 MED ORDER — METRONIDAZOLE 0.75 % VA GEL: 1.0000 | Freq: Every day | VAGINAL | 0 refills | Status: DC

## 2016-05-14 NOTE — Discharge Instructions (Signed)

## 2016-05-14 NOTE — MAU Note (Signed)
Finished cycle Thurs and vag irritation and d/c started Friday. Foul odor to d/c. Sometimes clear color and sometimes creamy. Have had BV and feels like that. Sometimes occurs after my cycle finishes

## 2016-05-14 NOTE — MAU Provider Note (Signed)
History     CSN: [id number]  Arrival date and time: 05/13/16 2342   None     Chief Complaint  Patient presents with  . Vaginal Discharge   HPI   Shirley Greene is a 21 y.o. female G1P1001 here in MAU with complaints of vaginal discharge.  The discharge started 2 days ago. The discharge is white and foul smelling. She has had BV in the past and this is similar.   OB History    Gravida Para Term Preterm AB Living   SAB TAB Ectopic Multiple Live Births           1      Obstetric Comments   "OP" presentation, got stuck at 9cm      Past Medical History:  Diagnosis Date  . Anemia   . Chlamydia   . Infection    UTI    Past Surgical History:  Procedure Laterality Date  . CESAREAN SECTION N/A 04/05/2012   Procedure: CESAREAN SECTION;  Surgeon: Tilda Burrow, MD;  Location: [facility];  Service: Obstetrics;  Laterality: N/A;    Family History  Problem Relation Age of Onset  . Hypertension Mother   . Hypertension Father   . Cancer Maternal Grandfather     Social History  Substance Use Topics  . Smoking status: Never Smoker  . Smokeless tobacco: Never Used  . Alcohol use Yes     Comment: twice a month    Allergies: No Known Allergies  Prescriptions Prior to Admission  Medication Sig Dispense Refill Last Dose  . ibuprofen (ADVIL,MOTRIN) 600 MG tablet Take 1 tablet (600 mg total) by mouth every 6 (six) hours as needed. 30 tablet 2   . ondansetron (ZOFRAN) 4 MG tablet Take 1 tablet (4 mg total) by mouth every 8 (eight) hours as needed for nausea or vomiting. 20 tablet 1    Results for orders placed or performed during the hospital encounter of 05/13/16 (from the past 48 hour(s))  Wet prep, genital     Status: Abnormal   Collection Time: 05/14/16 12:25 AM  Result Value Ref Range   Yeast Wet Prep HPF POC NONE SEEN NONE SEEN   Trich, Wet Prep NONE SEEN NONE SEEN   Clue Cells Wet Prep HPF POC PRESENT (A) NONE SEEN   WBC, Wet Prep HPF POC FEW (A) NONE  SEEN    Comment: MANY BACTERIA SEEN   Sperm NONE SEEN     Review of Systems  Constitutional: Negative for fever.  Gastrointestinal: Negative for abdominal pain.   Physical Exam   Blood pressure 119/70, pulse (!) 106, temperature 98 F (36.7 C), resp. rate 18, height 5' (1.524 m), weight 170 lb (77.1 kg), last menstrual period 05/07/2016.  Physical Exam  Constitutional: She is oriented to person, place, and time. She appears well-developed and well-nourished. No distress.  Respiratory: Effort normal.  Genitourinary:  Genitourinary Comments: Wet prep and GC collected by RN without speculum.   Musculoskeletal: Normal range of motion.  Neurological: She is alert and oriented to person, place, and time.  Skin: Skin is warm. She is not diaphoretic.  Psychiatric: Her behavior is normal.    MAU Course  Procedures  None  MDM  Wet prep and Gc    Assessment and Plan    A:  1. BV (bacterial vaginosis)     P:  Discharge home in stable condition Rx: Metrogel Return to MAU for emergencies  only   Duane Lope, NP 05/14/2016 12:55 AM

## 2016-05-14 NOTE — Progress Notes (Signed)
Blind swab by RN for collection.

## 2016-05-15 LAB — GC/CHLAMYDIA PROBE AMP (~~LOC~~) NOT AT ARMC
CHLAMYDIA, DNA PROBE: NEGATIVE
Neisseria Gonorrhea: NEGATIVE

## 2016-05-29 ENCOUNTER — Ambulatory Visit: Payer: Medicaid Other

## 2016-10-06 ENCOUNTER — Other Ambulatory Visit (HOSPITAL_COMMUNITY)
Admission: RE | Admit: 2016-10-06 | Discharge: 2016-10-06 | Disposition: A | Discharge status: Home / Self Care | Payer: Medicaid Other | Source: Ambulatory Visit | Attending: Obstetrics & Gynecology | Admitting: Obstetrics & Gynecology

## 2016-10-06 ENCOUNTER — Other Ambulatory Visit: Payer: Medicaid Other

## 2016-10-06 VITALS — BP 88/52 | HR 71

## 2016-10-06 DIAGNOSIS — N898 Other specified noninflammatory disorders of vagina: Secondary | ICD-10-CM | POA: Insufficient documentation

## 2016-10-06 NOTE — Progress Notes (Signed)
Agree with nursing staff's documentation of this patient's clinic encounter.  Ciarah Peace, MD    

## 2016-10-06 NOTE — Progress Notes (Signed)
Patient presented to the office today for a self swab for std testing. She reports having some discharge and would like to be check. Patient has been advised we will call her back in 2-3 days with results.

## 2016-10-09 LAB — CERVICOVAGINAL ANCILLARY ONLY
Bacterial vaginitis: NEGATIVE
CANDIDA VAGINITIS: NEGATIVE
CHLAMYDIA, DNA PROBE: NEGATIVE
Neisseria Gonorrhea: NEGATIVE
TRICH (WINDOWPATH): NEGATIVE

## 2016-10-22 ENCOUNTER — Encounter (HOSPITAL_COMMUNITY): Payer: Self-pay | Admitting: Emergency Medicine

## 2016-10-22 ENCOUNTER — Emergency Department (HOSPITAL_COMMUNITY)
Admission: EM | Admit: 2016-10-22 | Discharge: 2016-10-22 | Disposition: A | Discharge status: Home / Self Care | Payer: Medicaid Other | Attending: Emergency Medicine | Admitting: Emergency Medicine

## 2016-10-22 ENCOUNTER — Emergency Department (HOSPITAL_COMMUNITY): Payer: Medicaid Other

## 2016-10-22 DIAGNOSIS — R600 Localized edema: Secondary | ICD-10-CM | POA: Diagnosis present

## 2016-10-22 DIAGNOSIS — Z79899 Other long term (current) drug therapy: Secondary | ICD-10-CM | POA: Diagnosis not present

## 2016-10-22 DIAGNOSIS — M79644 Pain in right finger(s): Secondary | ICD-10-CM | POA: Diagnosis not present

## 2016-10-22 NOTE — ED Provider Notes (Signed)
[facility]-EMERGENCY DEPT Provider Note   CSN: [id number] Arrival date & time: 10/22/16  1655     History   Chief Complaint Chief Complaint  Patient presents with  . finger swelling    HPI Shirley Greene is a 21 y.o. female.  HPI   Shirley Greene is a 21 y.o. female, with a history of anemia and chlamydia, presenting to the ED with pain to the right ring finger with some associated swelling beginning yesterday. Pain is minor, described as a soreness, nonradiating. Worse with movement at the PIP joint. Patient has not had this issue before. Has not taken any medications for this. States she struck her finger on a dresser yesterday, but "did not think anything of it." Denies numbness, weakness, or any other complaints.      Past Medical History:  Diagnosis Date  . Anemia   . Chlamydia   . Infection    UTI    Patient Active Problem List   Diagnosis Date Noted  . 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 Status post primary low transverse cesarean section 04/10/2012    Past Surgical History:  Procedure Laterality Date  . CESAREAN SECTION N/A 04/05/2012   Procedure: CESAREAN SECTION;  Surgeon: Tilda Burrow, MD;  Location: [facility];  Service: Obstetrics;  Laterality: N/A;    OB History    Gravida Para Term Preterm AB Living   SAB TAB Ectopic Multiple Live Births           1      Obstetric Comments   "OP" presentation, got stuck at 9cm       Home Medications    Prior to Admission medications   Medication Sig Start Date End Date Taking? Authorizing Provider  ibuprofen (ADVIL,MOTRIN) 600 MG tablet Take 1 tablet (600 mg total) by mouth every 6 (six) hours as needed. Patient not taking: Reported on 10/06/2016 02/21/16   Katrinka Blazing, [place], CNM  metroNIDAZOLE (METROGEL VAGINAL) 0.75 % vaginal gel Place 1 Applicatorful vaginally at bedtime. Patient not taking: Reported on 10/06/2016 05/14/16   Rasch, Victorino Dike I, NP  ondansetron  (ZOFRAN) 4 MG tablet Take 1 tablet (4 mg total) by mouth every 8 (eight) hours as needed for nausea or vomiting. Patient not taking: Reported on 10/06/2016 02/21/16   Dorathy Kinsman, CNM    Family History Family History  Problem Relation Age of Onset  . Hypertension Mother   . Hypertension Father   . Cancer Maternal Grandfather     Social History Social History  Substance Use Topics  . Smoking status: Never Smoker  . Smokeless tobacco: Never Used  . Alcohol use Yes     Comment: twice a month     Allergies   Patient has no known allergies.   Review of Systems Review of Systems  Musculoskeletal: Positive for arthralgias and joint swelling.  Neurological: Negative for weakness and numbness.     Physical Exam Updated Vital Signs BP 112/78   Pulse 94   Temp 98.5 F (36.9 C) (Oral)   Resp 16   LMP 10/17/2016   SpO2 100%   Physical Exam  Constitutional: She appears well-developed and well-nourished. No distress.  HENT:  Head: Normocephalic and atraumatic.  Eyes: Conjunctivae are normal.  Neck: Neck supple.  Cardiovascular: Normal rate, regular rhythm and intact distal pulses.   Pulmonary/Chest: Effort normal.  Musculoskeletal:  Some very minor swelling to the proximal phalanx of  the right ring finger. Minor tenderness. Minor pain with range of motion at the MCP and PIP joints, however, range of motion is fully intact. No increased warmth, erythema, or wounds noted. Full range of motion without pain in the rest of the hand and wrist.  Neurological: She is alert.  No noted sensory deficits in the fingers of the right hand. 5/5 strength with flexion and extension at the DIP, PIP, and MCP joints of each of the fingers.  Skin: Skin is warm and dry. Capillary refill takes less than 2 seconds. She is not diaphoretic. No pallor.  Psychiatric: She has a normal mood and affect. Her behavior is normal.  Nursing note and vitals reviewed.    ED Treatments / Results  Labs (all  labs ordered are listed, but only abnormal results are displayed) Labs Reviewed - No data to display  EKG  EKG Interpretation None       Radiology Dg Finger Ring Right  Result Date: 10/22/2016 CLINICAL DATA:  21 y/o F; swelling of the right proximal ring finger. EXAM: RIGHT RING FINGER 2+V COMPARISON:  None. FINDINGS: There is no evidence of fracture or dislocation. There is no evidence of arthropathy or other focal bone abnormality. No soft tissue calcification. IMPRESSION: Negative. Electronically Signed   By: Mitzi Hansen M.D.   On: 10/22/2016 18:32    Procedures Procedures (including critical care time)  Medications Ordered in ED Medications - No data to display   Initial Impression / Assessment and Plan / ED Course  I have reviewed the triage vital signs and the nursing notes.  Pertinent labs & imaging results that were available during my care of the patient were reviewed by me and considered in my medical decision making (see chart for details).     Patient presents with minor pain and swelling to the proximal right ring finger following minor trauma yesterday. No acute abnormality on x-ray. The patient was given instructions for home care as well as return precautions. Patient voices understanding of these instructions, accepts the plan, and is comfortable with discharge.  Final Clinical Impressions(s) / ED Diagnoses   Final diagnoses:  Pain of finger of right hand    New Prescriptions Discharge Medication List as of 10/22/2016  7:25 PM       Anselm Pancoast, PA-C 10/23/16 1555    Charlynne Pander, MD 10/24/16 [phone]

## 2016-10-22 NOTE — Discharge Instructions (Signed)
You have been seen today for finger pain. There were no acute abnormalities on the x-rays, including no sign of fracture or dislocation. Pain: Take 600 mg of ibuprofen every 6 hours or 440 mg (over the counter dose) to 500 mg (prescription dose) of naproxen every 12 hours for the next 3 days. After this time, these medications may be used as needed for pain. Take these medications with food to avoid upset stomach. Choose only one of these medications, do not take them together.  Tylenol: Should you continue to have additional pain while taking the ibuprofen or naproxen, you may add in tylenol as needed. Your daily total maximum amount of tylenol from all sources should be limited to /day for persons without liver problems, or /day for those with liver problems. Ice: May apply ice to the area over the next 24 hours for 15 minutes at a time to reduce swelling. Elevation: Keep the extremity elevated as often as possible to reduce pain and inflammation. Support: Wear the finger splint for support and comfort. Wear this until pain resolves.  Exercises: Start by performing these exercises a few times a week, increasing the frequency until you are performing them twice daily.  Follow up: If symptoms are improving, you may follow up with your primary care provider for any continued management. If symptoms are not improving, you may follow up with the orthopedic specialist.

## 2016-10-22 NOTE — ED Triage Notes (Signed)
Pt reports she began to have R proximal ring finger swelling yesterday. Pt thinks she may have injured her finger, but cannot think of the specific event. No lacerations or abrasions.

## 2017-01-03 ENCOUNTER — Other Ambulatory Visit: Payer: Medicaid Other | Admitting: *Deleted

## 2017-01-03 ENCOUNTER — Encounter: Payer: Self-pay | Admitting: *Deleted

## 2017-01-03 ENCOUNTER — Other Ambulatory Visit (HOSPITAL_COMMUNITY)
Admission: RE | Admit: 2017-01-03 | Discharge: 2017-01-03 | Disposition: A | Discharge status: Home / Self Care | Payer: Medicaid Other | Source: Ambulatory Visit | Attending: Obstetrics & Gynecology | Admitting: Obstetrics & Gynecology

## 2017-01-03 DIAGNOSIS — Z202 Contact with and (suspected) exposure to infections with a predominantly sexual mode of transmission: Secondary | ICD-10-CM | POA: Diagnosis present

## 2017-01-03 NOTE — Progress Notes (Signed)
SUBJECTIVE:  21 y.o. female complains of possible STD exposure and would like testing. She has normal thin vaginal discharge. Denies any vaginal itching or burning. She c/o vulvar irritation but thinks it could be from using [employer] products.  Denies abnormal vaginal bleeding or significant pelvic pain or fever. No UTI symptoms. Unknown but possible exposure to STD.  No LMP recorded.  OBJECTIVE:  She appears well, afebrile.  ASSESSMENT:  Vaginal Irritation Possible STD exposure   PLAN:  GC, chlamydia, trichomonas, BVAG, CVAG probe sent to lab. STI blood labs sent to lab. Treatment: To be determined once lab results are received ROV prn if symptoms persist or worsen.

## 2017-01-04 LAB — CERVICOVAGINAL ANCILLARY ONLY
Bacterial vaginitis: NEGATIVE
CANDIDA VAGINITIS: NEGATIVE
CHLAMYDIA, DNA PROBE: NEGATIVE
NEISSERIA GONORRHEA: NEGATIVE
TRICH (WINDOWPATH): NEGATIVE

## 2017-01-04 LAB — HEPATITIS C ANTIBODY: Hep C Virus Ab: <0.1 s/co ratio (ref 0.0–0.9)

## 2017-01-04 LAB — HIV ANTIBODY (ROUTINE TESTING W REFLEX): HIV Screen 4th Generation wRfx: NONREACTIVE

## 2017-01-04 LAB — RPR: RPR Ser Ql: NONREACTIVE

## 2017-01-04 LAB — HEPATITIS B SURFACE ANTIGEN: HEP B S AG: NEGATIVE

## 2017-01-19 ENCOUNTER — Encounter: Payer: Self-pay | Admitting: Obstetrics and Gynecology

## 2017-01-19 ENCOUNTER — Ambulatory Visit: Payer: Medicaid Other | Admitting: Obstetrics and Gynecology

## 2017-01-19 NOTE — Progress Notes (Signed)
Patient did not keep GYN appointment for 01/19/2017.  Cornelia Copaharlie Brelee Renk, Jr MD Attending Center for [facility](Faculty Practice)

## 2017-01-29 ENCOUNTER — Encounter (HOSPITAL_COMMUNITY): Payer: Self-pay | Admitting: Emergency Medicine

## 2017-01-29 ENCOUNTER — Ambulatory Visit (HOSPITAL_COMMUNITY)
Admission: EM | Admit: 2017-01-29 | Discharge: 2017-01-29 | Disposition: A | Discharge status: Home / Self Care | Payer: Medicaid Other | Attending: Internal Medicine | Admitting: Internal Medicine

## 2017-01-29 ENCOUNTER — Other Ambulatory Visit: Payer: Self-pay

## 2017-01-29 DIAGNOSIS — H1031 Unspecified acute conjunctivitis, right eye: Secondary | ICD-10-CM

## 2017-01-29 MED ORDER — POLYMYXIN B-TRIMETHOPRIM 10000-0.1 UNIT/ML-% OP SOLN: 1.0000 [drp] | OPHTHALMIC | 0 refills | Status: AC

## 2017-01-29 NOTE — ED Provider Notes (Signed)
[facility]    CSN: [id number] Arrival date & time: 01/29/17  1246     History   Chief Complaint Chief Complaint  Patient presents with  . Conjunctivitis    HPI Shirley Greene is a 22 y.o. female.   Shirley Greene presents with her son with complaints of right eye irritation, sensation of "grit" in eye with itching and pain, tearing, swelling and mattering, which started two days ago. She states she was exposed to pink eye by a coworker and family member. Son also with similar symptoms to bilateral eyes. Tried benadryl which did not help with symptoms. Mild eye pain. Without vision change, blurred vision, light sensitivity or double vision. Does not use contacts or glasses. Without trauma, exposure or injury to the eye. Discomfort is 4/10. Without URI symptoms.   ROS per HPI.       Past Medical History:  Diagnosis Date  . Anemia   . Chlamydia   . Infection    UTI    Patient Active Problem List   Diagnosis Date Noted  . 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 Status post primary low transverse cesarean section 04/10/2012    Past Surgical History:  Procedure Laterality Date  . CESAREAN SECTION N/A 04/05/2012   Procedure: CESAREAN SECTION;  Surgeon: Tilda Burrow, MD;  Location: [facility];  Service: Obstetrics;  Laterality: N/A;    OB History    Gravida Para Term Preterm AB Living   1 1 1     1    SAB TAB Ectopic Multiple Live Births           1      Obstetric Comments   "OP" presentation, got stuck at 9cm       Home Medications    Prior to Admission medications   Medication Sig Start Date End Date Taking? Authorizing Provider  ibuprofen (ADVIL,MOTRIN) 600 MG tablet Take 1 tablet (600 mg total) by mouth every 6 (six) hours as needed. 02/21/16   Katrinka Blazing, [place], CNM  metroNIDAZOLE (METROGEL VAGINAL) 0.75 % vaginal gel Place 1 Applicatorful vaginally at bedtime. 05/14/16   Rasch, Victorino Dike I, NP  ondansetron (ZOFRAN) 4 MG  tablet Take 1 tablet (4 mg total) by mouth every 8 (eight) hours as needed for nausea or vomiting. 02/21/16   Katrinka Blazing, [place], CNM  trimethoprim-polymyxin b (POLYTRIM) ophthalmic solution Place 1 drop into the right eye every 4 (four) hours for 5 days. 01/29/17 02/03/17  Georgetta Haber, NP    Family History Family History  Problem Relation Age of Onset  . Hypertension Mother   . Hypertension Father   . Cancer Maternal Grandfather     Social History Social History   Tobacco Use  . Smoking status: Never Smoker  . Smokeless tobacco: Never Used  Substance Use Topics  . Alcohol use: Yes    Comment: twice a month  . Drug use: No     Allergies   Patient has no known allergies.   Review of Systems Review of Systems   Physical Exam Triage Vital Signs ED Triage Vitals  Enc Vitals Group     BP 01/29/17 1327 (!) 112/59     Pulse Rate 01/29/17 1327 90     Resp 01/29/17 1327 18     Temp 01/29/17 1327 97.9 F (36.6 C)     Temp Source 01/29/17 1327 Oral     SpO2 01/29/17 1327 97 %     Weight --  Height --      Head Circumference --      Peak Flow --      Pain Score 01/29/17 1326 4     Pain Loc --      Pain Edu? --      Excl. in GC? --    No data found.  Updated Vital Signs BP (!) 112/59 (BP Location: Left Arm)   Pulse 90   Temp 97.9 F (36.6 C) (Oral)   Resp 18   SpO2 97%   Visual Acuity Right Eye Distance:   Left Eye Distance:   Bilateral Distance:    Right Eye Near:   Left Eye Near:    Bilateral Near:     Physical Exam  Constitutional: She is oriented to person, place, and time. She appears well-developed and well-nourished. No distress.  Eyes: EOM are normal. Pupils are equal, round, and reactive to light. Right eye exhibits discharge.  Mild right upper lid swelling noted; mild right eye injection  Cardiovascular: Normal rate, regular rhythm and normal heart sounds.  Pulmonary/Chest: Effort normal and breath sounds normal.  Neurological: She is alert  and oriented to person, place, and time.  Skin: Skin is warm and dry.     UC Treatments / Results  Labs (all labs ordered are listed, but only abnormal results are displayed) Labs Reviewed - No data to display  EKG  EKG Interpretation None       Radiology No results found.  Procedures Procedures (including critical care time)  Medications Ordered in UC Medications - No data to display   Initial Impression / Assessment and Plan / UC Course  I have reviewed the triage vital signs and the nursing notes.  Pertinent labs & imaging results that were available during my care of the patient were reviewed by me and considered in my medical decision making (see chart for details).     Without significant redness or discharge from eye on exam; mild edema to lid; history and son in room consistent with conjunctivitis, polytrim initiated at this time. Recommended follow up with ophthalmology in 2-3 days for recheck of symptoms. Return precautions provided. Patient verbalized understanding and agreeable to plan.    Final Clinical Impressions(s) / UC Diagnoses   Final diagnoses:  Acute conjunctivitis of right eye, unspecified acute conjunctivitis type    ED Discharge Orders        Ordered    trimethoprim-polymyxin b (POLYTRIM) ophthalmic solution  Every 4 hours     01/29/17 1347       Controlled Substance Prescriptions [place] Controlled Substance Registry consulted? Not Applicable   Georgetta HaberBurky, Evelynne Spiers B, NP 01/29/17 1357

## 2017-01-29 NOTE — Discharge Instructions (Signed)
Eye drops to right eye until resolution of symptoms.  Considered contagious for the next 24 hours following start of eye drops. Recheck with ophthalmology if symptoms do not improve in 48 hours, develop increased pain, vision change or otherwise worsening. Avoid touching left eye to prevent spread.

## 2017-01-29 NOTE — ED Triage Notes (Signed)
Right eye feels "gritty", felt like something in eye-this was on Saturday.  Right eye red, puffy, sore and tearing, crusty on waking

## 2017-01-31 DIAGNOSIS — H109 Unspecified conjunctivitis: Secondary | ICD-10-CM | POA: Diagnosis not present

## 2017-02-14 NOTE — Progress Notes (Signed)
Last pap 10/16 Std testing

## 2017-02-15 ENCOUNTER — Other Ambulatory Visit (HOSPITAL_COMMUNITY)
Admission: RE | Admit: 2017-02-15 | Discharge: 2017-02-15 | Disposition: A | Discharge status: Home / Self Care | Payer: Medicaid Other | Source: Ambulatory Visit | Attending: Obstetrics and Gynecology | Admitting: Obstetrics and Gynecology

## 2017-02-15 ENCOUNTER — Encounter: Payer: Self-pay | Admitting: Obstetrics and Gynecology

## 2017-02-15 ENCOUNTER — Ambulatory Visit (INDEPENDENT_AMBULATORY_CARE_PROVIDER_SITE_OTHER): Payer: Medicaid Other | Admitting: Obstetrics and Gynecology

## 2017-02-15 VITALS — BP 111/75 | HR 95 | Wt 164.6 lb

## 2017-02-15 DIAGNOSIS — B9689 Other specified bacterial agents as the cause of diseases classified elsewhere: Secondary | ICD-10-CM

## 2017-02-15 DIAGNOSIS — Z0000 Encounter for general adult medical examination without abnormal findings: Secondary | ICD-10-CM

## 2017-02-15 DIAGNOSIS — B373 Candidiasis of vulva and vagina: Secondary | ICD-10-CM

## 2017-02-15 DIAGNOSIS — N879 Dysplasia of cervix uteri, unspecified: Secondary | ICD-10-CM

## 2017-02-15 DIAGNOSIS — Z01419 Encounter for gynecological examination (general) (routine) without abnormal findings: Secondary | ICD-10-CM

## 2017-02-15 DIAGNOSIS — N760 Acute vaginitis: Secondary | ICD-10-CM

## 2017-02-15 DIAGNOSIS — B3731 Acute candidiasis of vulva and vagina: Secondary | ICD-10-CM

## 2017-02-15 HISTORY — DX: Dysplasia of cervix uteri, unspecified: N87.9

## 2017-02-15 MED ORDER — FLUCONAZOLE 150 MG PO TABS: 150.0000 mg | ORAL_TABLET | Freq: Once | ORAL | 1 refills | Status: AC

## 2017-02-15 MED ORDER — METRONIDAZOLE 0.75 % VA GEL: 1.0000 | Freq: Every day | VAGINAL | 1 refills | Status: AC

## 2017-02-15 NOTE — Progress Notes (Signed)
Obstetrics and Gynecology Annual Patient Evaluation  Appointment Date: 02/15/2017  OBGYN Clinic: [facility]  Primary Care Provider: Patient, No Pcp Per  Chief Complaint:  Chief Complaint  Patient presents with  . Gynecologic Exam    History of Present Illness: Shirley Greene is a 22 y.o. African-American G1P1001 (early jan 2019), seen for the above chief complaint. Her past medical history is significant for h/o STIs, BMI 32, h/o cervical dysplasia  She has no particular OBGYN complaint or issue except for some vaginal irritation  No breast s/s, fevers, chills, chest pain, SOB, nausea, vomiting, abdominal pain, dysuria, hematuria, vaginal itching, dyspareunia, diarrhea, constipation, blood in BMs  Review of Systems: as noted in the History of Present Illness.   Past Medical History:  Past Medical History:  Diagnosis Date  . Anemia   . Chlamydia   . Infection    UTI    Past Surgical History:  Past Surgical History:  Procedure Laterality Date  . CESAREAN SECTION N/A 04/05/2012   Procedure: CESAREAN SECTION;  Surgeon: Tilda Burrow, MD;  Location: [facility];  Service: Obstetrics;  Laterality: N/A;    Past Obstetrical History:  OB History  Gravida Para Term Preterm AB Living  1 1 1     1   SAB TAB Ectopic Multiple Live Births          1    # Outcome Date GA Lbr Len/2nd Weight Sex Delivery Anes PTL Lv  1 Term 04/05/12 [redacted]w[redacted]d  7 lb 14.8 oz (3.595 kg) M CS-LTranv EPI  LIV    Obstetric Comments  "OP" presentation, got stuck at 9cm    Past Gynecological History: As per HPI. Periods: qmonth, regular, 5-7d, heavy and painful the first few days only History of Pap Smear(s): Yes.   Last pap LSIL, which was 12/2015 with negative ECC on colpo History of STI(s): Yes.   She is currently using nothing for contraception.   Social History:  Social History   Socioeconomic History  . Marital status: Single    Spouse name: Not on file  . Number of  children: Not on file  . Years of education: Not on file  . Highest education level: Not on file  Social Needs  . Financial resource strain: Not on file  . Food insecurity - worry: Not on file  . Food insecurity - inability: Not on file  . Transportation needs - medical: Not on file  . Transportation needs - non-medical: Not on file  Occupational History  . Not on file  Tobacco Use  . Smoking status: Never Smoker  . Smokeless tobacco: Never Used  Substance and Sexual Activity  . Alcohol use: Yes    Comment: twice a month  . Drug use: No  . Sexual activity: Yes    Birth control/protection: None  Other Topics Concern  . Not on file  Social History Narrative  . Not on file    Family History:  Family History  Problem Relation Age of Onset  . Hypertension Mother   . Hypertension Father   . Cancer Maternal Grandfather    She denies any female cancers.   Medications None  Allergies Patient has no known allergies.   Physical Exam:  BP 111/75   Pulse 95   Wt 164 lb 9.6 oz (74.7 kg)   BMI 32.15 kg/m  Body mass index is 32.15 kg/m. General appearance: Well nourished, well developed female in no acute distress.  Neck:  Supple, normal appearance,  and no thyromegaly  Cardiovascular: normal s1 and s2.  No murmurs, rubs or gallops. Respiratory:  Clear to auscultation bilateral. Normal respiratory effort Abdomen: positive bowel sounds and no masses, hernias; diffusely non tender to palpation, non distended Neuro/Psych:  Normal mood and affect.  Skin:  Warm and dry.  Lymphatic:  No inguinal lymphadenopathy.   Pelvic exam: is not limited by body habitus EGBUS: within normal limits, Vagina: within normal limits and with no blood but with cottage white discharge in the vault, Cervix: normal appearing cervix without tenderness, discharge or lesions. Uterus:  nonenlarged and non tender and Adnexa:  normal adnexa and no mass, fullness, tenderness Rectovaginal:  deferred  Laboratory: none  Radiology: none  Assessment: pt doing well  Plan:  1. Encounter for gynecological examination Routine care. Follow up pap. Pt declines serum sti testing.  - Cytology - PAP  2. Vulvovaginal candidiasis diflcuan  3. Bacterial vaginosis Pt prefers metrogel due to gi issues with flagyl  RTC PRN  Cornelia Copaharlie Marykay Mccleod, Jr MD Attending Center [facility](Faculty Practice)

## 2017-02-19 LAB — CYTOLOGY - PAP
Adequacy: ABSENT
Chlamydia: NEGATIVE
Diagnosis: NEGATIVE
Neisseria Gonorrhea: NEGATIVE
Trichomonas: NEGATIVE

## 2017-04-15 ENCOUNTER — Other Ambulatory Visit: Payer: Self-pay

## 2017-04-15 ENCOUNTER — Encounter (HOSPITAL_COMMUNITY): Payer: Self-pay

## 2017-04-15 ENCOUNTER — Inpatient Hospital Stay (HOSPITAL_COMMUNITY)
Admission: AD | Admit: 2017-04-15 | Discharge: 2017-04-15 | Disposition: A | Discharge status: Home / Self Care | Payer: Medicaid Other | Source: Ambulatory Visit | Attending: Obstetrics and Gynecology | Admitting: Obstetrics and Gynecology

## 2017-04-15 DIAGNOSIS — B372 Candidiasis of skin and nail: Secondary | ICD-10-CM

## 2017-04-15 DIAGNOSIS — N6489 Other specified disorders of breast: Secondary | ICD-10-CM | POA: Diagnosis not present

## 2017-04-15 DIAGNOSIS — R21 Rash and other nonspecific skin eruption: Secondary | ICD-10-CM | POA: Diagnosis present

## 2017-04-15 NOTE — MAU Provider Note (Signed)
History     CSN: [id number]  Arrival date and [id number]: 04/15/17 [id number]   First Provider Initiated Contact with Patient 04/15/17 2000      Chief Complaint  Patient presents with  . Rash    bilateral breasts   HPI Shirley Greene 22 y.o. Comes to MAU to check the dark rash under her breasts that she noticed 2 days ago. Comes to MAU as she does not know what kind of rash it is.  Does not itch.  Her mother did not know what it was either and encouraged her to come and get it checked.  OB History    Gravida  1   Para  1   Term  1   Preterm      AB      Living  1     SAB      TAB      Ectopic      Multiple      Live Births  1        Obstetric Comments  "OP" presentation, got stuck at 9cm        Past Medical History:  Diagnosis Date  . Anemia   . Chlamydia   . Infection    UTI    Past Surgical History:  Procedure Laterality Date  . CESAREAN SECTION N/A 04/05/2012   Procedure: CESAREAN SECTION;  Surgeon: Tilda BurrowJohn V Ferguson, MD;  Location: [facility];  Service: Obstetrics;  Laterality: N/A;    Family History  Problem Relation Age of Onset  . Hypertension Mother   . Hypertension Father   . Cancer Maternal Grandfather     Social History   Tobacco Use  . Smoking status: Never Smoker  . Smokeless tobacco: Never Used  Substance Use Topics  . Alcohol use: Yes    Comment: twice a month  . Drug use: No    Allergies: No Known Allergies  Medications Prior to Admission  Medication Sig Dispense Refill Last Dose  . ibuprofen (ADVIL,MOTRIN) 600 MG tablet Take 1 tablet (600 mg total) by mouth every 6 (six) hours as needed. (Patient not taking: Reported on 02/15/2017) 30 tablet 2 Not Taking  . ondansetron (ZOFRAN) 4 MG tablet Take 1 tablet (4 mg total) by mouth every 8 (eight) hours as needed for nausea or vomiting. (Patient not taking: Reported on 02/15/2017) 20 tablet 1 Not Taking    Review of Systems  Constitutional: Negative for fever.  Gastrointestinal:  Negative for abdominal pain.  Skin: Positive for color change and rash.       Rash under both breasts No itching   Physical Exam   Blood pressure (!) 114/59, pulse 91, temperature 98.1 F (36.7 C), temperature source Oral, resp. rate 18, height 5\' 1"  (1.549 m), weight 162 lb 14.4 oz (73.9 kg).  Physical Exam  Nursing note and vitals reviewed. Constitutional: She is oriented to person, place, and time. She appears well-developed and well-nourished.  HENT:  Head: Normocephalic.  Eyes: EOM are normal.  Neck: Neck supple.  Respiratory: Effort normal.  Genitourinary:  Genitourinary Comments: Large, pendulous breasts, normal skin noted on initial observation, Nipples - no lesions, no rash.  When picks up her breasts a 5-6 cm oblong shape of hyperpigmented skin noted in fold under each breast.  No redness, no peeling, no flaking.  Appearance consistent with chronic yeast in the skin fold.  Musculoskeletal: Normal range of motion.  Neurological: She is alert and oriented to person, place, and time.  Skin:  Skin is warm and dry.  Psychiatric: She has a normal mood and affect.    MAU Course  Procedures  MDM This is often a common problem in women with large pendulous breasts.  Assessment and Plan  Yeast rash under breasts bilaterally  Plan Since client is not being bothered by this except from the appearance, will treat with OTC topical cream. See discharge instructions for more information that was given to the client. Follow up in the office PRN.  Issiac Jamar L Xara Paulding 04/15/2017, 8:07 PM

## 2017-04-15 NOTE — Discharge Instructions (Signed)
Keep area very dry if possible. Use a hair dryer on cool setting to dry after bathing. Can use an over the counter yeast cream like monistat to the area. Do not use vagisil. Will take several weeks to improve and some people keep a chronic discoloration there, but try treatment and see if it will improve. Hot months make it harder to heal when your skin is sweating. Women with large breasts have this problem from time to time.

## 2017-04-15 NOTE — Progress Notes (Addendum)
Here for irritated rash on bilateral breasts nonitchy. Noticed it yesterday.   Noted darkened areas underneath the breasts bilaterally.   Provider at bs assessing  D/c orders received  2011: D/c orders given to folllowup with primary care manager

## 2017-05-21 ENCOUNTER — Ambulatory Visit: Payer: Medicaid Other | Admitting: Obstetrics & Gynecology

## 2017-06-07 ENCOUNTER — Ambulatory Visit (HOSPITAL_COMMUNITY)
Admission: EM | Admit: 2017-06-07 | Discharge: 2017-06-07 | Disposition: A | Discharge status: Home / Self Care | Payer: Medicaid Other | Attending: Family Medicine | Admitting: Family Medicine

## 2017-06-07 ENCOUNTER — Encounter (HOSPITAL_COMMUNITY): Payer: Self-pay | Admitting: Emergency Medicine

## 2017-06-07 DIAGNOSIS — L299 Pruritus, unspecified: Secondary | ICD-10-CM | POA: Diagnosis not present

## 2017-06-07 MED ORDER — CETIRIZINE HCL 10 MG PO CAPS: 10.0000 mg | ORAL_CAPSULE | Freq: Every day | ORAL | 0 refills | Status: DC

## 2017-06-07 MED ORDER — PREDNISONE 50 MG PO TABS: 50.0000 mg | ORAL_TABLET | Freq: Every day | ORAL | 0 refills | Status: AC

## 2017-06-07 NOTE — ED Provider Notes (Signed)
[facility]    CSN: [id number] Arrival date & time: 06/07/17  1002     History   Chief Complaint Chief Complaint  Patient presents with  . Pruritis    HPI Shirley Greene is a 22 y.o. female presenting today for evaluation of generalized itching.  Patient states that over the past 2 days she has had worsening itching diffusely across her body.  Feels itching began on her back.  Denies any rashes.  She does note that she recently changed since of detergent, but did not change brand.  She also notes that recently at work she was working with medicines and they "wasted" onto her.  She has not taken anything for her symptoms.  HPI  Past Medical History:  Diagnosis Date  . Anemia   . Chlamydia   . Infection    UTI    Patient Active Problem List   Diagnosis Date Noted  . Cervical dysplasia 02/15/2017    Past Surgical History:  Procedure Laterality Date  . CESAREAN SECTION N/A 04/05/2012   Procedure: CESAREAN SECTION;  Surgeon: Tilda Burrow, MD;  Location: [facility];  Service: Obstetrics;  Laterality: N/A;    OB History    Gravida  1   Para  1   Term  1   Preterm      AB      Living  1     SAB      TAB      Ectopic      Multiple      Live Births  1        Obstetric Comments  "OP" presentation, got stuck at 9cm         Home Medications    Prior to Admission medications   Medication Sig Start Date End Date Taking? Authorizing Provider  Cetirizine HCl 10 MG CAPS Take 1 capsule (10 mg total) by mouth daily for 10 days. 06/07/17 06/17/17  Shaliah Wann C, PA-C  predniSONE (DELTASONE) 50 MG tablet Take 1 tablet (50 mg total) by mouth daily for 5 days. 06/07/17 06/12/17  Jedaiah Rathbun, Junius Creamer, PA-C    Family History Family History  Problem Relation Age of Onset  . Hypertension Mother   . Hypertension Father   . Cancer Maternal Grandfather     Social History Social History   Tobacco Use  . Smoking status: Never Smoker  . Smokeless  tobacco: Never Used  Substance Use Topics  . Alcohol use: Yes    Comment: twice a month  . Drug use: No     Allergies   Patient has no known allergies.   Review of Systems Review of Systems  Constitutional: Negative for fatigue and fever.  HENT: Negative for mouth sores.   Eyes: Negative for visual disturbance.  Respiratory: Negative for shortness of breath.   Cardiovascular: Negative for chest pain.  Gastrointestinal: Negative for abdominal pain, nausea and vomiting.  Genitourinary: Negative for genital sores.  Musculoskeletal: Negative for arthralgias and joint swelling.  Skin: Negative for color change, rash and wound.       Pruritus  Neurological: Negative for dizziness, weakness, light-headedness and headaches.     Physical Exam Triage Vital Signs ED Triage Vitals [06/07/17 1025]  Enc Vitals Group     BP 105/72     Pulse Rate 89     Resp 16     Temp 98.3 F (36.8 C)     Temp Source Oral     SpO2 99 %  Weight      Height      Head Circumference      Peak Flow      Pain Score      Pain Loc      Pain Edu?      Excl. in GC?    No data found.  Updated Vital Signs BP 105/72 (BP Location: Right Arm)   Pulse 89   Temp 98.3 F (36.8 C) (Oral)   Resp 16   SpO2 99%   Visual Acuity Right Eye Distance:   Left Eye Distance:   Bilateral Distance:    Right Eye Near:   Left Eye Near:    Bilateral Near:     Physical Exam  Constitutional: She appears well-developed and well-nourished. No distress.  HENT:  Head: Normocephalic and atraumatic.  Eyes: Conjunctivae are normal.  Neck: Neck supple.  Cardiovascular: Normal rate and regular rhythm.  No murmur heard. Pulmonary/Chest: Effort normal and breath sounds normal. No respiratory distress.  Abdominal: Soft. There is no tenderness.  Musculoskeletal: She exhibits no edema.  Neurological: She is alert.  Skin: Skin is warm and dry.  No obvious rash or erythema, small area of hyperpigmentation to left  side of thoracic back near scapula.  Skin does not appear dry.  Psychiatric: She has a normal mood and affect.  Nursing note and vitals reviewed.    UC Treatments / Results  Labs (all labs ordered are listed, but only abnormal results are displayed) Labs Reviewed - No data to display  EKG None  Radiology No results found.  Procedures Procedures (including critical care time)  Medications Ordered in UC Medications - No data to display  Initial Impression / Assessment and Plan / UC Course  I have reviewed the triage vital signs and the nursing notes.  Pertinent labs & imaging results that were available during my care of the patient were reviewed by me and considered in my medical decision making (see chart for details).     Patient with generalized pruritus, possibly dry skin versus allergic reaction.  No clear rash.  Will treat symptomatically with antihistamines, prednisone.  Also discussed moisturizing measures.Discussed strict return precautions. Patient verbalized understanding and is agreeable with plan.  Final Clinical Impressions(s) / UC Diagnoses   Final diagnoses:  Pruritus     Discharge Instructions     Please take prednisone daily for 5 days  Zyrtec twice daily for itching, may also add in pepcid as this is a histamine blocker as well  Moisturizing measures with thicker creams  Follow up if developing rash, or other symptoms or symptoms not improving.   ED Prescriptions    Medication Sig Dispense Auth. Provider   Cetirizine HCl 10 MG CAPS Take 1 capsule (10 mg total) by mouth daily for 10 days. 15 capsule Brenda Cowher C, PA-C   predniSONE (DELTASONE) 50 MG tablet Take 1 tablet (50 mg total) by mouth daily for 5 days. 5 tablet Qusay Villada C, PA-C     Controlled Substance Prescriptions [place] Controlled Substance Registry consulted? No   Lew Dawes, [place] 06/07/17 1043

## 2017-06-07 NOTE — ED Triage Notes (Signed)
Pt states shes been itching all over for two days. No rash

## 2017-06-07 NOTE — Discharge Instructions (Signed)
Please take prednisone daily for 5 days  Zyrtec twice daily for itching, may also add in pepcid as this is a histamine blocker as well  Moisturizing measures with thicker creams  Follow up if developing rash, or other symptoms or symptoms not improving.

## 2017-06-13 ENCOUNTER — Encounter (HOSPITAL_COMMUNITY): Payer: Self-pay | Admitting: Emergency Medicine

## 2017-06-13 ENCOUNTER — Ambulatory Visit (HOSPITAL_COMMUNITY)
Admission: EM | Admit: 2017-06-13 | Discharge: 2017-06-13 | Disposition: A | Discharge status: Home / Self Care | Payer: Medicaid Other | Attending: Family Medicine | Admitting: Family Medicine

## 2017-06-13 ENCOUNTER — Other Ambulatory Visit: Payer: Self-pay

## 2017-06-13 DIAGNOSIS — L299 Pruritus, unspecified: Secondary | ICD-10-CM | POA: Diagnosis not present

## 2017-06-13 MED ORDER — PERMETHRIN 5 % EX CREA: TOPICAL_CREAM | CUTANEOUS | 1 refills | Status: DC

## 2017-06-13 NOTE — ED Provider Notes (Signed)
[facility] CARE CENTER   [id number] 06/13/17 Arrival Time: 1120  ASSESSMENT & PLAN:  1. Itching    Will treat empirically for scabies. She elects to continue OTC anti-histamine.  Meds ordered this encounter  Medications  . permethrin (ELIMITE) 5 % cream    Sig: Apply from neck down before bed then wash off in the morning. May repeat in one week.    Dispense:  60 g    Refill:  1   Will follow up here if worsening or failing to improve as anticipated. Reviewed expectations re: course of current medical issues. Questions answered. Outlined signs and symptoms indicating need for more acute intervention. Patient verbalized understanding. After Visit Summary given.   SUBJECTIVE:   Shirley Greene is a 22 y.o. female who presents with complaint of persistent itching of her body. Gradual onset over a few weeks. Seen here last week and Rx anti-histamine and prednisone. Little reported help. "Itch all over, day and night." No h/o similar. Known trigger? No  New soaps/lotions/topicals/detergents? No Environmental exposures or allergies? none Contacts with similar? No Recent travel? No  Other associated symptoms: none Therapies tried thus far: as in HPI. Denies fever. No specific aggravating or alleviating factors reported.  ROS: As per HPI.  OBJECTIVE: Vitals:   06/13/17 1212  BP: 110/76  Pulse: (!) 101  Resp: 16  Temp: 98.5 F (36.9 C)  TempSrc: Oral  SpO2: 100%    General appearance: alert; no distress Lungs: non-labored respirations Extremities: no edema Skin: warm and dry; no rashes seen Psychological: alert and cooperative; normal mood and affect  No Known Allergies  Past Medical History:  Diagnosis Date  . Anemia   . Chlamydia   . Infection    UTI   Social History   Socioeconomic History  . Marital status: Single    Spouse name: Not on file  . Number of children: Not on file  . Years of education: Not on file  . Highest education level: Not on file    Occupational History  . Not on file  Social Needs  . Financial resource strain: Not on file  . Food insecurity:    Worry: Not on file    Inability: Not on file  . Transportation needs:    Medical: Not on file    Non-medical: Not on file  Tobacco Use  . Smoking status: Never Smoker  . Smokeless tobacco: Never Used  Substance and Sexual Activity  . Alcohol use: Yes    Comment: twice a month  . Drug use: No  . Sexual activity: Yes    Birth control/protection: None  Lifestyle  . Physical activity:    Days per week: Not on file    Minutes per session: Not on file  . Stress: Not on file  Relationships  . Social connections:    Talks on phone: Not on file    Gets together: Not on file    Attends religious service: Not on file    Active member of club or organization: Not on file    Attends meetings of clubs or organizations: Not on file    Relationship status: Not on file  . Intimate partner violence:    Fear of current or ex partner: Not on file    Emotionally abused: Not on file    Physically abused: Not on file    Forced sexual activity: Not on file  Other Topics Concern  . Not on file  Social History Narrative  . Not  on file   Family History  Problem Relation Age of Onset  . Hypertension Mother   . Hypertension Father   . Cancer Maternal Grandfather    Past Surgical History:  Procedure Laterality Date  . CESAREAN SECTION N/A 04/05/2012   Procedure: CESAREAN SECTION;  Surgeon: Tilda Burrow, MD;  Location: [facility];  Service: Obstetrics;  Laterality: N/AMardella Layman, MD 06/13/17 1255

## 2017-06-13 NOTE — ED Triage Notes (Signed)
Rash all over her body.  First noticed rash 2 days ago.  Patient noticed itching last Wednesday or Thursday.

## 2017-07-30 ENCOUNTER — Emergency Department (HOSPITAL_COMMUNITY): Payer: Medicaid Other

## 2017-07-30 ENCOUNTER — Other Ambulatory Visit: Payer: Self-pay

## 2017-07-30 ENCOUNTER — Emergency Department (HOSPITAL_COMMUNITY)
Admission: EM | Admit: 2017-07-30 | Discharge: 2017-07-30 | Disposition: A | Discharge status: Home / Self Care | Payer: Medicaid Other | Attending: Emergency Medicine | Admitting: Emergency Medicine

## 2017-07-30 ENCOUNTER — Encounter (HOSPITAL_COMMUNITY): Payer: Self-pay | Admitting: Emergency Medicine

## 2017-07-30 DIAGNOSIS — R1010 Upper abdominal pain, unspecified: Secondary | ICD-10-CM | POA: Insufficient documentation

## 2017-07-30 DIAGNOSIS — R109 Unspecified abdominal pain: Secondary | ICD-10-CM | POA: Diagnosis not present

## 2017-07-30 DIAGNOSIS — R0781 Pleurodynia: Secondary | ICD-10-CM

## 2017-07-30 DIAGNOSIS — R1012 Left upper quadrant pain: Secondary | ICD-10-CM | POA: Diagnosis not present

## 2017-07-30 DIAGNOSIS — R0789 Other chest pain: Secondary | ICD-10-CM | POA: Diagnosis not present

## 2017-07-30 DIAGNOSIS — R1011 Right upper quadrant pain: Secondary | ICD-10-CM | POA: Diagnosis not present

## 2017-07-30 LAB — CBC
HCT: 43.9 % (ref 36.0–46.0)
HEMOGLOBIN: 13.1 g/dL (ref 12.0–15.0)
MCH: 24.1 pg — AB (ref 26.0–34.0)
MCHC: 29.8 g/dL — ABNORMAL LOW (ref 30.0–36.0)
MCV: 80.7 fL (ref 78.0–100.0)
PLATELETS: 302 10*3/uL (ref 150–400)
RBC: 5.44 MIL/uL — AB (ref 3.87–5.11)
RDW: 15.9 % — ABNORMAL HIGH (ref 11.5–15.5)
WBC: 4.5 10*3/uL (ref 4.0–10.5)

## 2017-07-30 LAB — COMPREHENSIVE METABOLIC PANEL
ALK PHOS: 57 U/L (ref 38–126)
ALT: 16 U/L (ref 0–44)
ANION GAP: 10 (ref 5–15)
AST: 21 U/L (ref 15–41)
Albumin: 4.4 g/dL (ref 3.5–5.0)
BILIRUBIN TOTAL: 0.8 mg/dL (ref 0.3–1.2)
BUN: 7 mg/dL (ref 6–20)
CALCIUM: 9.5 mg/dL (ref 8.9–10.3)
CO2: 24 mmol/L (ref 22–32)
CREATININE: 0.91 mg/dL (ref 0.44–1.00)
Chloride: 106 mmol/L (ref 98–111)
GFR calc non Af Amer: >60 mL/min (ref >60)
GLUCOSE: 83 mg/dL (ref 70–99)
Potassium: 3.7 mmol/L (ref 3.5–5.1)
Sodium: 140 mmol/L (ref 135–145)
TOTAL PROTEIN: 7.6 g/dL (ref 6.5–8.1)

## 2017-07-30 LAB — I-STAT BETA HCG BLOOD, ED (MC, WL, AP ONLY)

## 2017-07-30 LAB — LIPASE, BLOOD: Lipase: 26 U/L (ref 11–51)

## 2017-07-30 MED ORDER — IOHEXOL 300 MG/ML  SOLN
100.0000 mL | Freq: Once | INTRAMUSCULAR | Status: AC | PRN
  Administered 2017-07-30: 100 mL via INTRAVENOUS

## 2017-07-30 MED ORDER — ACETAMINOPHEN 500 MG PO TABS: 500.0000 mg | ORAL_TABLET | Freq: Four times a day (QID) | ORAL | 0 refills | Status: DC | PRN

## 2017-07-30 MED ORDER — IBUPROFEN 800 MG PO TABS: 800.0000 mg | ORAL_TABLET | Freq: Three times a day (TID) | ORAL | 0 refills | Status: DC

## 2017-07-30 NOTE — ED Triage Notes (Signed)
Pt states she has right upper quadrant abdominal pain and right rib pain, states she is sore everywhere. She had a party this weekend and thinks it could have been a slip in slide injury but shes not sure. No N/V/D. Pt voice is hoarse but denies cough.

## 2017-07-30 NOTE — ED Notes (Signed)
Pt called out stating "if this CT is not necessary, I don't want it and just want to eat something and go home because I have been here for too damn long."

## 2017-07-30 NOTE — Discharge Instructions (Signed)
You can use ice to the area 3-4 times daily alternating 20 minutes on, 20 minutes off.  You can alternate ibuprofen and Tylenol as needed.  Please return to emergency department if you develop any new or worsening symptoms.

## 2017-07-30 NOTE — ED Notes (Signed)
Iv team at bedside  

## 2017-07-30 NOTE — ED Notes (Signed)
IV team unsuccessful,calling second IV team member to attempt

## 2017-07-30 NOTE — ED Provider Notes (Signed)
[facility] EMERGENCY DEPARTMENT Provider Note   CSN: [id number] Arrival date & time: 07/30/17  1218     History   Chief Complaint Chief Complaint  Patient presents with  . Rib Injury  . Abdominal Pain    HPI Shirley Greene is a 22 y.o. female with history of anemia who presents with bilateral rib pain and upper abdominal pain that began 2 days ago.  Patient reports she was using a slip and slide and thought she may have injured herself that way.  She is sore all over, however has severe pain in her upper abdomen and ribs.  She denies any nausea, vomiting, chest pain, shortness of breath, urinary symptoms, abnormal vaginal bleeding or discharge.  She has taken Tylenol at home with some relief.  HPI  Past Medical History:  Diagnosis Date  . Anemia   . Chlamydia   . Infection    UTI    Patient Active Problem List   Diagnosis Date Noted  . Cervical dysplasia 02/15/2017    Past Surgical History:  Procedure Laterality Date  . CESAREAN SECTION N/A 04/05/2012   Procedure: CESAREAN SECTION;  Surgeon: Tilda Burrow, MD;  Location: [facility];  Service: Obstetrics;  Laterality: N/A;     OB History    Gravida  1   Para  1   Term  1   Preterm      AB      Living  1     SAB      TAB      Ectopic      Multiple      Live Births  1        Obstetric Comments  "OP" presentation, got stuck at 9cm         Home Medications    Prior to Admission medications   Medication Sig Start Date End Date Taking? Authorizing Provider  acetaminophen (TYLENOL) 500 MG tablet Take 1 tablet (500 mg total) by mouth every 6 (six) hours as needed. 07/30/17   Kaleth Koy, Waylan Boga, PA-C  Cetirizine HCl 10 MG CAPS Take 1 capsule (10 mg total) by mouth daily for 10 days. 06/07/17 06/17/17  Wieters, Hallie C, PA-C  ibuprofen (ADVIL,MOTRIN) 800 MG tablet Take 1 tablet (800 mg total) by mouth 3 (three) times daily. 07/30/17   Ocean Schildt, Waylan Boga, PA-C  permethrin (ELIMITE) 5 %  cream Apply from neck down before bed then wash off in the morning. May repeat in one week. 06/13/17   Mardella Layman, MD    Family History Family History  Problem Relation Age of Onset  . Hypertension Mother   . Hypertension Father   . Cancer Maternal Grandfather     Social History Social History   Tobacco Use  . Smoking status: Never Smoker  . Smokeless tobacco: Never Used  Substance Use Topics  . Alcohol use: Yes    Comment: twice a month  . Drug use: No     Allergies   Patient has no known allergies.   Review of Systems Review of Systems  Constitutional: Negative for chills and fever.  HENT: Negative for facial swelling and sore throat.   Respiratory: Negative for shortness of breath.   Cardiovascular: Negative for chest pain.  Gastrointestinal: Positive for abdominal pain. Negative for nausea and vomiting.  Genitourinary: Negative for dysuria.  Musculoskeletal: Positive for back pain, myalgias and neck pain.  Skin: Negative for rash and wound.  Neurological: Negative for headaches.  Psychiatric/Behavioral: The  patient is not nervous/anxious.      Physical Exam Updated Vital Signs BP 115/80   Pulse 68   Temp 98.4 F (36.9 C) (Oral)   Resp 16   LMP 07/16/2017   SpO2 99%   Physical Exam  Constitutional: She appears well-developed and well-nourished. No distress.  HENT:  Head: Normocephalic and atraumatic.  Mouth/Throat: Oropharynx is clear and moist. No oropharyngeal exudate.  Eyes: Pupils are equal, round, and reactive to light. Conjunctivae are normal. Right eye exhibits no discharge. Left eye exhibits no discharge. No scleral icterus.  Neck: Normal range of motion. Neck supple. No thyromegaly present.  Cardiovascular: Normal rate, regular rhythm, normal heart sounds and intact distal pulses. Exam reveals no gallop and no friction rub.  No murmur heard. Pulmonary/Chest: Effort normal and breath sounds normal. No stridor. No respiratory distress. She has  no wheezes. She has no rales. She exhibits no tenderness.  No significant tenderness to the chest wall or ribs  Abdominal: Soft. Bowel sounds are normal. She exhibits no distension. There is tenderness in the right upper quadrant, epigastric area and left upper quadrant. There is no rebound and no guarding.  Musculoskeletal: She exhibits no edema.  No midline cervical, thoracic, or lumbar tenderness No posterior rib tenderness  Lymphadenopathy:    She has no cervical adenopathy.  Neurological: She is alert. Coordination normal.  Skin: Skin is warm and dry. No rash noted. She is not diaphoretic. No pallor.  Psychiatric: She has a normal mood and affect.  Nursing note and vitals reviewed.    ED Treatments / Results  Labs (all labs ordered are listed, but only abnormal results are displayed) Labs Reviewed  CBC - Abnormal; Notable for the following components:      Result Value   RBC 5.44 (*)    MCH 24.1 (*)    MCHC 29.8 (*)    RDW 15.9 (*)    All other components within normal limits  LIPASE, BLOOD  COMPREHENSIVE METABOLIC PANEL  I-STAT BETA HCG BLOOD, ED ([facility], [facility], [facility] ONLY)    EKG None  Radiology Dg Ribs Bilateral W/chest  Result Date: 07/30/2017 CLINICAL DATA:  RIGHT upper quadrant abdominal pain and RIGHT rib pain. EXAM: BILATERAL RIBS AND CHEST - 4+ VIEW COMPARISON:  None. FINDINGS: No fracture or other bone lesions are seen involving the ribs. There is no evidence of pneumothorax or pleural effusion. Both lungs are clear. Heart size and mediastinal contours are within normal limits. IMPRESSION: Negative. Electronically Signed   By: Elsie StainJohn T Curnes M.D.   On: 07/30/2017 19:06   Ct Abdomen Pelvis W Contrast  Result Date: 07/30/2017 CLINICAL DATA:  Diffuse abdominal pain, RIGHT rib pain. Possible sliding injury at party 2 days ago. EXAM: CT ABDOMEN AND PELVIS WITH CONTRAST TECHNIQUE: Multidetector CT imaging of the abdomen and pelvis was performed using the standard protocol  following bolus administration of intravenous contrast. CONTRAST:  100mL OMNIPAQUE IOHEXOL 300 MG/ML  SOLN COMPARISON:  None. FINDINGS: LOWER CHEST: Lung bases are clear. Included heart size is normal. No pericardial effusion. HEPATOBILIARY: Liver and gallbladder are normal. PANCREAS: Normal. SPLEEN: Normal. ADRENALS/URINARY TRACT: Kidneys are orthotopic, demonstrating symmetric enhancement. No nephrolithiasis, hydronephrosis or solid renal masses. The unopacified ureters are normal in course and caliber. Urinary bladder is decompressed and unremarkable. Normal adrenal glands. STOMACH/BOWEL: The stomach, small and large bowel are normal in course and caliber without inflammatory changes. Normal appendix. VASCULAR/LYMPHATIC: Aortoiliac vessels are normal in course and caliber. No lymphadenopathy by CT size criteria.  REPRODUCTIVE: Normal. OTHER: Small volume low-density free fluid in the pelvis is likely physiologic. No intraperitoneal free air or focal fluid collections. MUSCULOSKELETAL: Nonacute. Small fat containing umbilical hernia. Anterior pelvic wall scarring attributable to cysts air in section. IMPRESSION: 1. No acute intra-abdominal or pelvic process. Electronically Signed   By: Awilda Metro M.D.   On: 07/30/2017 22:03    Procedures Procedures (including critical care time)  Medications Ordered in ED Medications  iohexol (OMNIPAQUE) 300 MG/ML solution 100 mL (100 mLs Intravenous Contrast Given 07/30/17 2131)     Initial Impression / Assessment and Plan / ED Course  I have reviewed the triage vital signs and the nursing notes.  Pertinent labs & imaging results that were available during my care of the patient were reviewed by me and considered in my medical decision making (see chart for details).     Patient presenting with suspected abdominal and rib contusions. Labs are unremarkable. However, considering patient's significant tenderness, CT was ordered to r/o intraabdominal  hemorrhage considering blunt trauma on a slip and slide face down. CTAP was negative. Supportive treatment discussed including ice, ibuprofen, and Tylenol. Patient understands and agrees with plan. Return precautions discussed. Patient vitals stable throughout ED course and discharged in satisfactory condition.  Final Clinical Impressions(s) / ED Diagnoses   Final diagnoses:  Rib pain  Pain of upper abdomen    ED Discharge Orders        Ordered    ibuprofen (ADVIL,MOTRIN) 800 MG tablet  3 times daily     07/30/17 2215    acetaminophen (TYLENOL) 500 MG tablet  Every 6 hours PRN     07/30/17 2215       Emi Holes, PA-C 07/30/17 2321    Derwood Kaplan, MD 07/31/17 1120

## 2017-07-30 NOTE — ED Notes (Signed)
Pt to xray

## 2017-07-30 NOTE — ED Notes (Signed)
IV team at bedside 

## 2017-07-30 NOTE — ED Notes (Signed)
Unable to obtain IV, IV team notified

## 2017-07-30 NOTE — ED Notes (Signed)
Spoke with ct and they state she is next for scan . Pt updated on plan of care.

## 2017-08-17 ENCOUNTER — Encounter: Payer: Self-pay | Admitting: Obstetrics and Gynecology

## 2017-08-17 ENCOUNTER — Ambulatory Visit: Payer: Medicaid Other | Admitting: Obstetrics and Gynecology

## 2017-08-20 NOTE — Progress Notes (Signed)
Patient did not keep her GYN appointment for 08/17/2017.  Cornelia Copaharlie Rudolph Daoust, Jr MD Attending Center for [facility](Faculty Practice)

## 2017-09-21 ENCOUNTER — Other Ambulatory Visit: Payer: Self-pay

## 2017-09-26 ENCOUNTER — Ambulatory Visit (INDEPENDENT_AMBULATORY_CARE_PROVIDER_SITE_OTHER): Payer: Medicaid Other | Admitting: Family Medicine

## 2017-09-26 ENCOUNTER — Other Ambulatory Visit (HOSPITAL_COMMUNITY)
Admission: RE | Admit: 2017-09-26 | Discharge: 2017-09-26 | Disposition: A | Discharge status: Home / Self Care | Payer: Medicaid Other | Source: Ambulatory Visit | Attending: Obstetrics and Gynecology | Admitting: Obstetrics and Gynecology

## 2017-09-26 ENCOUNTER — Encounter: Payer: Self-pay | Admitting: Family Medicine

## 2017-09-26 VITALS — BP 105/71 | HR 83 | Wt 145.0 lb

## 2017-09-26 DIAGNOSIS — Z113 Encounter for screening for infections with a predominantly sexual mode of transmission: Secondary | ICD-10-CM | POA: Diagnosis not present

## 2017-09-26 DIAGNOSIS — Z3009 Encounter for other general counseling and advice on contraception: Secondary | ICD-10-CM | POA: Diagnosis not present

## 2017-09-26 NOTE — Progress Notes (Signed)
   GYNECOLOGY ANNUAL PREVENTATIVE CARE ENCOUNTER NOTE  Subjective:   Shirley Greene is a 22 y.o. G58P1001 female here for a a problem GYN visit.  Current complaints: desires to start birth control methods. 2 months ago had unprotected sex and used ECP. Has since had a preiod and using condoms.   Denies abnormal vaginal bleeding, discharge, pelvic pain, problems with intercourse or other gynecologic concerns.    Gynecologic History Patient's last menstrual period was 09/11/2017 (approximate). Contraception: condoms Last Pap: 2019, Jan. Results were: normal, prior PAP was LSIL. Pap in Jan 2020 then can resume routine screening Last mammogram: NA  The following portions of the patient's history were reviewed and updated as appropriate: allergies, current medications, past family history, past medical history, past social history, past surgical history and problem list.  Review of Systems Pertinent items are noted in HPI.   Objective:  BP 105/71   Pulse 83   Wt 145 lb (65.8 kg)   LMP 09/11/2017 (Approximate)   BMI 27.40 kg/m  CONSTITUTIONAL: Well-developed, well-nourished female in no acute distress.  HENT:  Normocephalic, atraumatic, External right and left ear normal. Oropharynx is clear and moist EYES:  No scleral icterus.  NECK: Normal range of motion, supple, no masses.  Normal thyroid.  SKIN: Skin is warm and dry. No rash noted. Not diaphoretic. No erythema. No pallor. NEUROLOGIC: Alert and oriented to person, place, and time. Normal reflexes, muscle tone coordination. No cranial nerve deficit noted. PSYCHIATRIC: Normal mood and affect. Normal behavior. Normal judgment and thought content. CARDIOVASCULAR: Normal heart rate noted, regular rhythm. 2+ distal pulses. RESPIRATORY: Effort and breath sounds normal, no problems with respiration noted. BREASTS: Symmetric in size. No masses, skin changes, nipple drainage, or lymphadenopathy. ABDOMEN: Soft,  no distention noted.  No  tenderness, rebound or guarding.  PELVIC: Normal appearing external genitalia; normal appearing vaginal mucosa and cervix.  No abnormal discharge noted.  Normal uterine size, no other palpable masses, no uterine or adnexal tenderness. MUSCULOSKELETAL: Normal range of motion.   Assessment and Plan:  1. Screening examination for venereal disease - Declined blood work - Cervicovaginal ancillary only  2. Encounter for general counseling and advice on contraceptive management Discussed options in tiered fashion. Reviewed patient's history with contraceptive methods. Patient desires to try LNG-IUD. Reviewed risk/benefits of procedure.    Please refer to After Visit Summary for other counseling recommendations.   Return in about 2 weeks (around 10/10/2017) for IUD placement- (LNG/Liletta).  Federico Flake, MD, MPH, ABFM Attending Physician Faculty Practice- Center for [facility]

## 2017-09-26 NOTE — Progress Notes (Signed)
Would like to start pills

## 2017-09-28 LAB — CERVICOVAGINAL ANCILLARY ONLY
BACTERIAL VAGINITIS: NEGATIVE
CANDIDA VAGINITIS: NEGATIVE
CHLAMYDIA, DNA PROBE: NEGATIVE
NEISSERIA GONORRHEA: NEGATIVE
TRICH (WINDOWPATH): NEGATIVE

## 2017-10-10 ENCOUNTER — Ambulatory Visit: Payer: Medicaid Other | Admitting: Family Medicine

## 2017-10-17 ENCOUNTER — Ambulatory Visit (INDEPENDENT_AMBULATORY_CARE_PROVIDER_SITE_OTHER): Payer: Medicaid Other | Admitting: Advanced Practice Midwife

## 2017-10-17 ENCOUNTER — Encounter: Payer: Self-pay | Admitting: Advanced Practice Midwife

## 2017-10-17 ENCOUNTER — Other Ambulatory Visit (HOSPITAL_COMMUNITY)
Admission: RE | Admit: 2017-10-17 | Discharge: 2017-10-17 | Disposition: A | Discharge status: Home / Self Care | Payer: Medicaid Other | Source: Ambulatory Visit | Attending: Advanced Practice Midwife | Admitting: Advanced Practice Midwife

## 2017-10-17 VITALS — BP 105/73 | HR 94 | Wt 150.0 lb

## 2017-10-17 DIAGNOSIS — Z3009 Encounter for other general counseling and advice on contraception: Secondary | ICD-10-CM | POA: Diagnosis not present

## 2017-10-17 DIAGNOSIS — N898 Other specified noninflammatory disorders of vagina: Secondary | ICD-10-CM | POA: Insufficient documentation

## 2017-10-17 DIAGNOSIS — Z3202 Encounter for pregnancy test, result negative: Secondary | ICD-10-CM

## 2017-10-17 LAB — POCT URINE PREGNANCY: Preg Test, Ur: NEGATIVE

## 2017-10-17 MED ORDER — NYSTATIN 100000 UNIT/GM EX CREA: 1.0000 "application " | TOPICAL_CREAM | Freq: Two times a day (BID) | CUTANEOUS | 0 refills | Status: DC

## 2017-10-17 NOTE — Progress Notes (Signed)
[id number]

## 2017-10-17 NOTE — Progress Notes (Signed)
GYNECOLOGY PROBLEM CARE ENCOUNTER NOTE  Subjective:   Shirley Greene is a 22 y.o. G45P1001 female here for evaluation of vaginal discharge and irritation as well as missed period.  Patient noticed new onset external labial irritation and abnormal vaginal mucus early last week. Then she spent last weekend at the beach, swimming in the hotel pool with friends, and noticed a significant worsening of her symptoms. Initiated OTC Monistat 7 on Sunday evening. Denies urinary symptoms, abnormal vaginal bleeding, discharge, pelvic pain, problems with intercourse or other gynecologic concerns.   Patient has secondary c/o missed menses. Tracks her period with an app and is three days late. Endorses unprotected sex since most recent period. Requesting POCT pregnancy test today. Previously scheduled IUD placement but canceled because she didn't know IUD could be placed during menstrual bleeding.   Gynecologic History LMP 09/14/2017 Contraception: none. Planning IUD Last Pap: 02/15/2017. Results were: normal with negative HPV Last mammogram: N/A age 51.  Obstetric History OB History  Gravida Para Term Preterm AB Living  1 1 1     1   SAB TAB Ectopic Multiple Live Births          1    # Outcome Date GA Lbr Len/2nd Weight Sex Delivery Anes PTL Lv  1 Term 04/05/12 [redacted]w[redacted]d  7 lb 14.8 oz (3.595 kg) M CS-LTranv EPI  LIV    Obstetric Comments  "OP" presentation, got stuck at 9cm    Past Medical History:  Diagnosis Date  . Anemia   . Chlamydia   . Infection    UTI    Past Surgical History:  Procedure Laterality Date  . CESAREAN SECTION N/A 04/05/2012   Procedure: CESAREAN SECTION;  Surgeon: Tilda Burrow, MD;  Location: [facility];  Service: Obstetrics;  Laterality: N/A;    Current Outpatient Medications on File Prior to Visit  Medication Sig Dispense Refill  . acetaminophen (TYLENOL) 500 MG tablet Take 1 tablet (500 mg total) by mouth every 6 (six) hours as needed. (Patient not taking: Reported  on 09/26/2017) 30 tablet 0  . Cetirizine HCl 10 MG CAPS Take 1 capsule (10 mg total) by mouth daily for 10 days. 15 capsule 0  . ibuprofen (ADVIL,MOTRIN) 800 MG tablet Take 1 tablet (800 mg total) by mouth 3 (three) times daily. (Patient not taking: Reported on 09/26/2017) 21 tablet 0  . permethrin (ELIMITE) 5 % cream Apply from neck down before bed then wash off in the morning. May repeat in one week. (Patient not taking: Reported on 09/26/2017) 60 g 1   No current facility-administered medications on file prior to visit.     No Known Allergies  Social History:  reports that she has never smoked. She has never used smokeless tobacco. She reports that she drinks alcohol. She reports that she does not use drugs.  Family History  Problem Relation Age of Onset  . Hypertension Mother   . Hypertension Father   . Cancer Maternal Grandfather     The following portions of the patient's history were reviewed and updated as appropriate: allergies, current medications, past family history, past medical history, past social history, past surgical history and problem list.  Review of Systems Pertinent items noted in HPI and remainder of comprehensive ROS otherwise negative.   Objective:  BP 105/73   Pulse 94   Wt 150 lb (68 kg)   BMI 28.34 kg/m  CONSTITUTIONAL: Well-developed, well-nourished female in no acute distress.  HENT:  Normocephalic, atraumatic, External right and  left ear normal. Oropharynx is clear and moist EYES: Conjunctivae and EOM are normal. Pupils are equal, round, and reactive to light. Marland Kitchen  SKIN: Skin is warm and dry. No rash noted. Not diaphoretic. No erythema. No pallor. NEUROLOGIC: Alert and oriented to person, place, and time. Normal reflexes, muscle tone coordination. No cranial nerve deficit noted. PSYCHIATRIC: Normal mood and affect. Normal behavior. Normal judgment and thought content. CARDIOVASCULAR: Normal heart rate noted, regular rhythm  ABDOMEN: Soft, normal bowel  sounds, no distention noted.  No tenderness, rebound or guarding.  PELVIC: Normal appearing external genitalia; normal appearing vaginal mucosa and cervix.  Difficult to assess vaginal discharge due to recent application of Monistat cream.   Normal uterine size, no other palpable masses, no uterine or adnexal tenderness.   Assessment and Plan:  1. Vaginal discharge     --Finish OTC Monistat 7 as indicated     --Manage external irritation with Nystatin cream, rx to pharmacy     --Discussed loose clothing, remove perfumes/fragarances from hygiene regimen     --No pools, hot tubs, waxing, etc until symptoms resolve  2. Counseling for birth control, IUD     --Negative pregnancy test today, LMP 09/14/17     --Continues to desire IUD     --Encouraged patient to schedule IUD placement during her period, take 800 mg Ibuprofen two hours before appt      Will follow up results of  cervicovaginal swab and manage accordingly. Routine preventative health maintenance measures emphasized. Please refer to After Visit Summary for other counseling recommendations.   Clayton Bibles, CNM 10/17/17 10:55 AM

## 2017-10-19 LAB — CERVICOVAGINAL ANCILLARY ONLY
BACTERIAL VAGINITIS: NEGATIVE
Candida vaginitis: NEGATIVE

## 2017-11-28 ENCOUNTER — Encounter: Payer: Self-pay | Admitting: Radiology

## 2018-01-16 HISTORY — DX: Maternal care for unspecified type scar from previous cesarean delivery: O34.219

## 2018-02-19 ENCOUNTER — Ambulatory Visit: Payer: Self-pay

## 2018-02-21 ENCOUNTER — Ambulatory Visit (INDEPENDENT_AMBULATORY_CARE_PROVIDER_SITE_OTHER): Payer: Medicaid Other

## 2018-02-21 ENCOUNTER — Other Ambulatory Visit (HOSPITAL_COMMUNITY)
Admission: RE | Admit: 2018-02-21 | Discharge: 2018-02-21 | Disposition: A | Discharge status: Home / Self Care | Payer: Medicaid Other | Source: Ambulatory Visit | Attending: Obstetrics & Gynecology | Admitting: Obstetrics & Gynecology

## 2018-02-21 VITALS — BP 101/69

## 2018-02-21 DIAGNOSIS — B9689 Other specified bacterial agents as the cause of diseases classified elsewhere: Secondary | ICD-10-CM

## 2018-02-21 DIAGNOSIS — N898 Other specified noninflammatory disorders of vagina: Secondary | ICD-10-CM

## 2018-02-21 DIAGNOSIS — N760 Acute vaginitis: Secondary | ICD-10-CM

## 2018-02-21 NOTE — Progress Notes (Signed)
SUBJECTIVE:  23 y.o. female complains of vag discharge for five days.  Denies abnormal vaginal bleeding or significant pelvic pain or fever. No UTI symptoms. Denies history of known exposure to STD.  No LMP recorded.  OBJECTIVE:  She appears well, afebrile. Urine dipstick:not completed  ASSESSMENT:   Vaginal Discharge: small amount Vaginal Odor:yes   PLAN:  GC, chlamydia, trichomonas, BVAG, CVAG probe sent to lab. Treatment: To be determined once lab results are received ROV prn if symptoms persist or worsen.

## 2018-02-24 LAB — CERVICOVAGINAL ANCILLARY ONLY
BACTERIAL VAGINITIS: POSITIVE — AB
CHLAMYDIA, DNA PROBE: NEGATIVE
Candida vaginitis: NEGATIVE
Neisseria Gonorrhea: NEGATIVE
TRICH (WINDOWPATH): NEGATIVE

## 2018-02-25 MED ORDER — METRONIDAZOLE 500 MG PO TABS: 500.0000 mg | ORAL_TABLET | Freq: Two times a day (BID) | ORAL | 0 refills | Status: DC

## 2018-02-25 NOTE — Addendum Note (Signed)
Addended by: Jaynie Collins A on: 02/25/2018 08:11 AM   Modules accepted: Orders

## 2018-02-25 NOTE — Progress Notes (Signed)
Last pap 02/15/2017- normal, would like to get one today. Pt would also like to start an OCP.   declining flu vaccine

## 2018-02-26 ENCOUNTER — Ambulatory Visit (INDEPENDENT_AMBULATORY_CARE_PROVIDER_SITE_OTHER): Payer: Medicaid Other | Admitting: Obstetrics and Gynecology

## 2018-02-26 ENCOUNTER — Other Ambulatory Visit (HOSPITAL_COMMUNITY)
Admission: RE | Admit: 2018-02-26 | Discharge: 2018-02-26 | Disposition: A | Discharge status: Home / Self Care | Payer: Medicaid Other | Source: Ambulatory Visit | Attending: Obstetrics and Gynecology | Admitting: Obstetrics and Gynecology

## 2018-02-26 ENCOUNTER — Encounter: Payer: Self-pay | Admitting: Obstetrics and Gynecology

## 2018-02-26 VITALS — BP 106/72 | HR 96 | Ht 60.0 in | Wt 150.0 lb

## 2018-02-26 DIAGNOSIS — Z0000 Encounter for general adult medical examination without abnormal findings: Secondary | ICD-10-CM | POA: Diagnosis not present

## 2018-02-26 DIAGNOSIS — Z01419 Encounter for gynecological examination (general) (routine) without abnormal findings: Secondary | ICD-10-CM | POA: Insufficient documentation

## 2018-02-26 MED ORDER — LEVONORGESTREL-ETHINYL ESTRAD 0.1-20 MG-MCG PO TABS: 1.0000 | ORAL_TABLET | Freq: Every day | ORAL | 4 refills | Status: DC

## 2018-02-26 MED ORDER — METRONIDAZOLE 0.75 % VA GEL: 1.0000 | Freq: Every day | VAGINAL | 1 refills | Status: DC

## 2018-02-26 NOTE — Progress Notes (Signed)
Subjective:     Shirley Greene is a 23 y.o. female G1P1 with BMI 29 and LMP 02/03/18 who is here for a comprehensive physical exam. The patient reports no problems. She reports a monthly 6-7 day period. She is sexually active using condoms for contraception. She is interested in starting OCP today. She denies any pelvic pain or abnormal discharge. She is currently being treated for BV with metrogel  Past Medical History:  Diagnosis Date  . Anemia   . Chlamydia   . Infection    UTI   Past Surgical History:  Procedure Laterality Date  . CESAREAN SECTION N/A 04/05/2012   Procedure: CESAREAN SECTION;  Surgeon: Tilda Burrow, MD;  Location: [facility];  Service: Obstetrics;  Laterality: N/A;   Family History  Problem Relation Age of Onset  . Hypertension Mother   . Hypertension Father   . Cancer Maternal Grandfather     Social History   Socioeconomic History  . Marital status: Single    Spouse name: Not on file  . Number of children: Not on file  . Years of education: Not on file  . Highest education level: Not on file  Occupational History  . Not on file  Social Needs  . Financial resource strain: Not on file  . Food insecurity:    Worry: Not on file    Inability: Not on file  . Transportation needs:    Medical: Not on file    Non-medical: Not on file  Tobacco Use  . Smoking status: Never Smoker  . Smokeless tobacco: Never Used  Substance and Sexual Activity  . Alcohol use: Yes    Comment: twice a month  . Drug use: No  . Sexual activity: Yes    Birth control/protection: None  Lifestyle  . Physical activity:    Days per week: Not on file    Minutes per session: Not on file  . Stress: Not on file  Relationships  . Social connections:    Talks on phone: Not on file    Gets together: Not on file    Attends religious service: Not on file    Active member of club or organization: Not on file    Attends meetings of clubs or organizations: Not on file    Relationship  status: Not on file  . Intimate partner violence:    Fear of current or ex partner: Not on file    Emotionally abused: Not on file    Physically abused: Not on file    Forced sexual activity: Not on file  Other Topics Concern  . Not on file  Social History Narrative  . Not on file   Health Maintenance  Topic Date Due  . INFLUENZA VACCINE  08/16/2017  . PAP SMEAR-Modifier  02/15/2018  . PAP-Cervical Cytology Screening  02/16/2020  . TETANUS/TDAP  04/08/2022  . HIV Screening  Completed       Review of Systems Pertinent items are noted in HPI.   Objective:  Blood pressure 106/72, pulse 96, height 5' (1.524 m), weight 150 lb (68 kg), last menstrual period 02/03/2018.     GENERAL: Well-developed, well-nourished female in no acute distress.  HEENT: Normocephalic, atraumatic. Sclerae anicteric.  NECK: Supple. Normal thyroid.  LUNGS: Clear to auscultation bilaterally.  HEART: Regular rate and rhythm. BREASTS: Symmetric in size. No palpable masses or lymphadenopathy, skin changes, or nipple drainage. ABDOMEN: Soft, nontender, nondistended. No organomegaly. PELVIC: Normal external female genitalia. Vagina is pink and rugated.  Normal discharge. Normal appearing cervix. Uterus is normal in size. No adnexal mass or tenderness. EXTREMITIES: No cyanosis, clubbing, or edema, 2+ distal pulses.    Assessment:    Healthy female exam.      Plan:    Pap smear collected Rx COC provided Patient will be contacted with abnormal results RTC in 1 year or prn See After Visit Summary for Counseling Recommendations

## 2018-02-27 LAB — CYTOLOGY - PAP: Diagnosis: NEGATIVE

## 2018-04-08 ENCOUNTER — Encounter: Payer: Self-pay | Admitting: Radiology

## 2018-04-08 ENCOUNTER — Other Ambulatory Visit: Payer: Self-pay

## 2018-04-08 ENCOUNTER — Other Ambulatory Visit (INDEPENDENT_AMBULATORY_CARE_PROVIDER_SITE_OTHER): Payer: Medicaid Other

## 2018-04-08 VITALS — BP 128/89 | HR 72

## 2018-04-08 DIAGNOSIS — Z3201 Encounter for pregnancy test, result positive: Secondary | ICD-10-CM

## 2018-04-08 NOTE — Progress Notes (Signed)
Shirley Greene presents today for UPT. She does not complain of any unusual complaints.  LMP: 03/07/2018    O/BJECTIVE: Appears well, in no apparent distress.  OB History    Gravida  1   Para  1   Term  1   Preterm      AB      Living  1     SAB      TAB      Ectopic      Multiple      Live Births  1        Obstetric Comments  "OP" presentation, got stuck at 9cm       Home UPT Result: positive  In-Office UPT result: positive  I have reviewed the patient's medical, obstetrical, social, and family histories, and medications.   ASSESSMENT: Positive pregnancy test  PLAN Prenatal care to be completed at: Patient will follow up around weeks of pregnancy. Advised to start taking prenatal vitamins.

## 2018-04-09 ENCOUNTER — Telehealth: Payer: Self-pay

## 2018-04-09 NOTE — Telephone Encounter (Signed)
Received called from patient she was told yesterday to used monistat for yeast infection. Patient stated she noticed some blood this morning after using the cream last night. I have advised her it is fine as long as she is not soaking 1-2 pads per hour. She reports some mild cramping however she has been cramping the whole time. She will continued to monitored her symptoms for now.

## 2018-04-13 ENCOUNTER — Inpatient Hospital Stay (HOSPITAL_COMMUNITY)
Admission: AD | Admit: 2018-04-13 | Discharge: 2018-04-13 | Disposition: A | Discharge status: Home / Self Care | Payer: Medicaid Other | Attending: Obstetrics and Gynecology | Admitting: Obstetrics and Gynecology

## 2018-04-13 ENCOUNTER — Inpatient Hospital Stay (HOSPITAL_COMMUNITY): Payer: Medicaid Other

## 2018-04-13 ENCOUNTER — Encounter (HOSPITAL_COMMUNITY): Payer: Self-pay | Admitting: *Deleted

## 2018-04-13 ENCOUNTER — Other Ambulatory Visit: Payer: Self-pay

## 2018-04-13 DIAGNOSIS — O208 Other hemorrhage in early pregnancy: Secondary | ICD-10-CM | POA: Diagnosis not present

## 2018-04-13 DIAGNOSIS — Z3A01 Less than 8 weeks gestation of pregnancy: Secondary | ICD-10-CM | POA: Diagnosis not present

## 2018-04-13 DIAGNOSIS — O209 Hemorrhage in early pregnancy, unspecified: Secondary | ICD-10-CM

## 2018-04-13 DIAGNOSIS — Z809 Family history of malignant neoplasm, unspecified: Secondary | ICD-10-CM | POA: Insufficient documentation

## 2018-04-13 DIAGNOSIS — O34219 Maternal care for unspecified type scar from previous cesarean delivery: Secondary | ICD-10-CM | POA: Insufficient documentation

## 2018-04-13 DIAGNOSIS — Z3491 Encounter for supervision of normal pregnancy, unspecified, first trimester: Secondary | ICD-10-CM

## 2018-04-13 LAB — URINALYSIS, ROUTINE W REFLEX MICROSCOPIC
BILIRUBIN URINE: NEGATIVE
Glucose, UA: NEGATIVE mg/dL
HGB URINE DIPSTICK: NEGATIVE
KETONES UR: NEGATIVE mg/dL
NITRITE: NEGATIVE
PH: 7.5 (ref 5.0–8.0)
Protein, ur: NEGATIVE mg/dL
SPECIFIC GRAVITY, URINE: 1.02 (ref 1.005–1.030)

## 2018-04-13 LAB — WET PREP, GENITAL
SPERM: NONE SEEN
Trich, Wet Prep: NONE SEEN
YEAST WET PREP: NONE SEEN

## 2018-04-13 LAB — POCT PREGNANCY, URINE: Preg Test, Ur: POSITIVE — AB

## 2018-04-13 NOTE — MAU Note (Signed)
Presents with c/o abdominal cramping and spotting, reports cramping has been present but spotting began on Tuesday.  LMP 03/01/2018, +UPT.

## 2018-04-13 NOTE — Discharge Instructions (Signed)
Safe Medications in Pregnancy  ° °Acne: °Benzoyl Peroxide °Salicylic Acid ° °Backache/Headache: °Tylenol: 2 regular strength every 4 hours OR °             2 Extra strength every 6 hours ° °Colds/Coughs/Allergies: °Benadryl (alcohol free) 25 mg every 6 hours as needed °Breath right strips °Claritin °Cepacol throat lozenges °Chloraseptic throat spray °Cold-Eeze- up to three times per day °Cough drops, alcohol free °Flonase (by prescription only) °Guaifenesin °Mucinex °Robitussin DM (plain only, alcohol free) °Saline nasal spray/drops °Sudafed (pseudoephedrine) & Actifed ** use only after [redacted] weeks gestation and if you do not have high blood pressure °Tylenol °Vicks Vaporub °Zinc lozenges °Zyrtec  ° °Constipation: °Colace °Ducolax suppositories °Fleet enema °Glycerin suppositories °Metamucil °Milk of magnesia °Miralax °Senokot °Smooth move tea ° °Diarrhea: °Kaopectate °Imodium A-D ° °*NO pepto Bismol ° °Hemorrhoids: °Anusol °Anusol HC °Preparation H °Tucks ° °Indigestion: °Tums °Maalox °Mylanta °Zantac  °Pepcid ° °Insomnia: °Benadryl (alcohol free) 25mg every 6 hours as needed °Tylenol PM °Unisom, no Gelcaps ° °Leg Cramps: °Tums °MagGel ° °Nausea/Vomiting:  °Bonine °Dramamine °Emetrol °Ginger extract °Sea bands °Meclizine  °Nausea medication to take during pregnancy:  °Unisom (doxylamine succinate 25 mg tablets) Take one tablet daily at bedtime. If symptoms are not adequately controlled, the dose can be increased to a maximum recommended dose of two tablets daily (1/2 tablet in the morning, 1/2 tablet mid-afternoon and one at bedtime). °Vitamin B6 100mg tablets. Take one tablet twice a day (up to 200 mg per day). ° °Skin Rashes: °Aveeno products °Benadryl cream or 25mg every 6 hours as needed °Calamine Lotion °1% cortisone cream ° °Yeast infection: °Gyne-lotrimin 7 °Monistat 7 ° ° °**If taking multiple medications, please check labels to avoid duplicating the same active ingredients °**take medication as directed on  the label °** Do not exceed 4000 mg of tylenol in 24 hours °**Do not take medications that contain aspirin or ibuprofen ° ° ° ° °First Trimester of Pregnancy °The first trimester of pregnancy is from week 1 until the end of week 13 (months 1 through 3). A week after a sperm fertilizes an egg, the egg will implant on the wall of the uterus. This embryo will begin to develop into a baby. Genes from you and your partner will form the baby. The female genes will determine whether the baby will be a boy or a girl. At 6-8 weeks, the eyes and face will be formed, and the heartbeat can be seen on ultrasound. At the end of 12 weeks, all the baby's organs will be formed. °Now that you are pregnant, you will want to do everything you can to have a healthy baby. Two of the most important things are to get good prenatal care and to follow your health care provider's instructions. Prenatal care is all the medical care you receive before the baby's birth. This care will help prevent, find, and treat any problems during the pregnancy and childbirth. °Body changes during your first trimester °Your body goes through many changes during pregnancy. The changes vary from woman to woman. °· You may gain or lose a couple of pounds at first. °· You may feel sick to your stomach (nauseous) and you may throw up (vomit). If the vomiting is uncontrollable, call your health care provider. °· You may tire easily. °· You may develop headaches that can be relieved by medicines. All medicines should be approved by your health care provider. °· You may urinate more often. Painful urination may mean you have   a bladder infection.  You may develop heartburn as a result of your pregnancy.  You may develop constipation because certain hormones are causing the muscles that push stool through your intestines to slow down.  You may develop hemorrhoids or swollen veins (varicose veins).  Your breasts may begin to grow larger and become tender. Your  nipples may stick out more, and the tissue that surrounds them (areola) may become darker.  Your gums may bleed and may be sensitive to brushing and flossing.  Dark spots or blotches (chloasma, mask of pregnancy) may develop on your face. This will likely fade after the baby is born.  Your menstrual periods will stop.  You may have a loss of appetite.  You may develop cravings for certain kinds of food.  You may have changes in your emotions from day to day, such as being excited to be pregnant or being concerned that something may go wrong with the pregnancy and baby.  You may have more vivid and strange dreams.  You may have changes in your hair. These can include thickening of your hair, rapid growth, and changes in texture. Some women also have hair loss during or after pregnancy, or hair that feels dry or thin. Your hair will most likely return to normal after your baby is born. What to expect at prenatal visits During a routine prenatal visit:  You will be weighed to make sure you and the baby are growing normally.  Your blood pressure will be taken.  Your abdomen will be measured to track your baby's growth.  The fetal heartbeat will be listened to between weeks 10 and 14 of your pregnancy.  Test results from any previous visits will be discussed. Your health care provider may ask you:  How you are feeling.  If you are feeling the baby move.  If you have had any abnormal symptoms, such as leaking fluid, bleeding, severe headaches, or abdominal cramping.  If you are using any tobacco products, including cigarettes, chewing tobacco, and electronic cigarettes.  If you have any questions. Other tests that may be performed during your first trimester include:  Blood tests to find your blood type and to check for the presence of any previous infections. The tests will also be used to check for low iron levels (anemia) and protein on red blood cells (Rh antibodies).  Depending on your risk factors, or if you previously had diabetes during pregnancy, you may have tests to check for high blood sugar that affects pregnant women (gestational diabetes).  Urine tests to check for infections, diabetes, or protein in the urine.  An ultrasound to confirm the proper growth and development of the baby.  Fetal screens for spinal cord problems (spina bifida) and Down syndrome.  HIV (human immunodeficiency virus) testing. Routine prenatal testing includes screening for HIV, unless you choose not to have this test.  You may need other tests to make sure you and the baby are doing well. Follow these instructions at home: Medicines  Follow your health care provider's instructions regarding medicine use. Specific medicines may be either safe or unsafe to take during pregnancy.  Take a prenatal vitamin that contains at least 600 micrograms (mcg) of folic acid.  If you develop constipation, try taking a stool softener if your health care provider approves. Eating and drinking   Eat a balanced diet that includes fresh fruits and vegetables, whole grains, good sources of protein such as meat, eggs, or tofu, and low-fat dairy. Your health  care provider will help you determine the amount of weight gain that is right for you.  Avoid raw meat and uncooked cheese. These carry germs that can cause birth defects in the baby.  Eating four or five small meals rather than three large meals a day may help relieve nausea and vomiting. If you start to feel nauseous, eating a few soda crackers can be helpful. Drinking liquids between meals, instead of during meals, also seems to help ease nausea and vomiting.  Limit foods that are high in fat and processed sugars, such as fried and sweet foods.  To prevent constipation: ? Eat foods that are high in fiber, such as fresh fruits and vegetables, whole grains, and beans. ? Drink enough fluid to keep your urine clear or pale  yellow. Activity  Exercise only as directed by your health care provider. Most women can continue their usual exercise routine during pregnancy. Try to exercise for 30 minutes at least 5 days a week. Exercising will help you: ? Control your weight. ? Stay in shape. ? Be prepared for labor and delivery.  Experiencing pain or cramping in the lower abdomen or lower back is a good sign that you should stop exercising. Check with your health care provider before continuing with normal exercises.  Try to avoid standing for long periods of time. Move your legs often if you must stand in one place for a long time.  Avoid heavy lifting.  Wear low-heeled shoes and practice good posture.  You may continue to have sex unless your health care provider tells you not to. Relieving pain and discomfort  Wear a good support bra to relieve breast tenderness.  Take warm sitz baths to soothe any pain or discomfort caused by hemorrhoids. Use hemorrhoid cream if your health care provider approves.  Rest with your legs elevated if you have leg cramps or low back pain.  If you develop varicose veins in your legs, wear support hose. Elevate your feet for 15 minutes, 3-4 times a day. Limit salt in your diet. Prenatal care  Schedule your prenatal visits by the twelfth week of pregnancy. They are usually scheduled monthly at first, then more often in the last 2 months before delivery.  Write down your questions. Take them to your prenatal visits.  Keep all your prenatal visits as told by your health care provider. This is important. Safety  Wear your seat belt at all times when driving.  Make a list of emergency phone numbers, including numbers for family, friends, the hospital, and police and fire departments. General instructions  Ask your health care provider for a referral to a local prenatal education class. Begin classes no later than the beginning of month 6 of your pregnancy.  Ask for help if  you have counseling or nutritional needs during pregnancy. Your health care provider can offer advice or refer you to specialists for help with various needs.  Do not use hot tubs, steam rooms, or saunas.  Do not douche or use tampons or scented sanitary pads.  Do not cross your legs for long periods of time.  Avoid cat litter boxes and soil used by cats. These carry germs that can cause birth defects in the baby and possibly loss of the fetus by miscarriage or stillbirth.  Avoid all smoking, herbs, alcohol, and medicines not prescribed by your health care provider. Chemicals in these products affect the formation and growth of the baby.  Do not use any products that contain nicotine  or tobacco, such as cigarettes and e-cigarettes. If you need help quitting, ask your health care provider. You may receive counseling support and other resources to help you quit.  Schedule a dentist appointment. At home, brush your teeth with a soft toothbrush and be gentle when you floss. Contact a health care provider if:  You have dizziness.  You have mild pelvic cramps, pelvic pressure, or nagging pain in the abdominal area.  You have persistent nausea, vomiting, or diarrhea.  You have a bad smelling vaginal discharge.  You have pain when you urinate.  You notice increased swelling in your face, hands, legs, or ankles.  You are exposed to fifth disease or chickenpox.  You are exposed to [place] measles (rubella) and have never had it. Get help right away if:  You have a fever.  You are leaking fluid from your vagina.  You have spotting or bleeding from your vagina.  You have severe abdominal cramping or pain.  You have rapid weight gain or loss.  You vomit blood or material that looks like coffee grounds.  You develop a severe headache.  You have shortness of breath.  You have any kind of trauma, such as from a fall or a car accident. Summary  The first trimester of pregnancy is  from week 1 until the end of week 13 (months 1 through 3).  Your body goes through many changes during pregnancy. The changes vary from woman to woman.  You will have routine prenatal visits. During those visits, your health care provider will examine you, discuss any test results you may have, and talk with you about how you are feeling. This information is not intended to replace advice given to you by your health care provider. Make sure you discuss any questions you have with your health care provider. Document Released: 12/27/2000 Document Revised: 12/15/2015 Document Reviewed: 12/15/2015 Elsevier Interactive Patient Education  2019 [employer].

## 2018-04-13 NOTE — MAU Provider Note (Signed)
History     CSN: [id number]  Arrival date and time: 04/13/18 1155  Chief Complaint  Patient presents with  . Cramping  . Spotting   HPI Shirley Greene is a 23 y.o. G2P1001 at [redacted]w[redacted]d who presents with spotting and cramping. She states she was seen at [facility] on Monday and confirmed her pregnancy as well as got recommendation to treat a yeast infection with Monistat. She states after she started using monistat, she was seeing bright red spotting. She stopped the [facility] Thursday and was still seeing the blood. She also reports sharp shooting pains in her lower abdomen for several weeks. She denies pain now.   OB History    Gravida  2   Para  1   Term  1   Preterm      AB      Living  1     SAB      TAB      Ectopic      Multiple      Live Births  1        Obstetric Comments  "OP" presentation, got stuck at 9cm        Past Medical History:  Diagnosis Date  . Anemia   . Chlamydia   . Infection    UTI    Past Surgical History:  Procedure Laterality Date  . CESAREAN SECTION N/A 04/05/2012   Procedure: CESAREAN SECTION;  Surgeon: Tilda Burrow, MD;  Location: [facility];  Service: Obstetrics;  Laterality: N/A;    Family History  Problem Relation Age of Onset  . Hypertension Mother   . Hypertension Father   . Cancer Maternal Grandfather     Social History   Tobacco Use  . Smoking status: Never Smoker  . Smokeless tobacco: Never Used  Substance Use Topics  . Alcohol use: Yes    Comment: twice a month  . Drug use: No    Allergies: No Known Allergies  No medications prior to admission.    Review of Systems  Constitutional: Negative.  Negative for fatigue and fever.  HENT: Negative.   Respiratory: Negative.  Negative for shortness of breath.   Cardiovascular: Negative.  Negative for chest pain.  Gastrointestinal: Positive for abdominal pain. Negative for constipation, diarrhea, nausea and vomiting.  Genitourinary: Positive for vaginal  bleeding. Negative for dysuria and vaginal discharge.  Neurological: Negative.  Negative for dizziness and headaches.   Physical Exam   Blood pressure (!) 109/55, pulse 95, temperature 98.2 F (36.8 C), temperature source Oral, resp. rate 19, height 5' (1.524 m), weight 66.7 kg, last menstrual period 03/01/2018, SpO2 100 %.  Physical Exam  Nursing note and vitals reviewed. Constitutional: She is oriented to person, place, and time. She appears well-developed and well-nourished. No distress.  HENT:  Head: Normocephalic.  Eyes: Pupils are equal, round, and reactive to light.  Cardiovascular: Normal rate, regular rhythm and normal heart sounds.  Respiratory: Effort normal and breath sounds normal. No respiratory distress.  GI: Soft. Bowel sounds are normal. She exhibits no distension. There is no abdominal tenderness.  Genitourinary:    Genitourinary Comments: Cervix closed, long thick. No bleeding noted   Neurological: She is alert and oriented to person, place, and time.  Skin: Skin is warm and dry.  Psychiatric: She has a normal mood and affect. Her behavior is normal. Judgment and thought content normal.    MAU Course  Procedures Results for orders placed or performed during the hospital encounter of  04/13/18 (from the past 24 hour(s))  Urinalysis, Routine w reflex microscopic     Status: Abnormal   Collection Time: 04/13/18 12:30 PM  Result Value Ref Range   Color, Urine YELLOW (A) YELLOW   APPearance CLEAR (A) CLEAR   Specific Gravity, Urine 1.020 1.005 - 1.030   pH 7.5 5.0 - 8.0   Glucose, UA NEGATIVE NEGATIVE mg/dL   Hgb urine dipstick NEGATIVE NEGATIVE   Bilirubin Urine NEGATIVE NEGATIVE   Ketones, ur NEGATIVE NEGATIVE mg/dL   Protein, ur NEGATIVE NEGATIVE mg/dL   Nitrite NEGATIVE NEGATIVE   Leukocytes,Ua TRACE (A) NEGATIVE   RBC / HPF 0-5 0 - 5 RBC/hpf   WBC, UA 0-5 0 - 5 WBC/hpf   Bacteria, UA RARE (A) NONE SEEN   Squamous Epithelial / LPF 11-20 0 - 5   Mucus  PRESENT   Pregnancy, urine POC     Status: Abnormal   Collection Time: 04/13/18 12:31 PM  Result Value Ref Range   Preg Test, Ur POSITIVE (A) NEGATIVE  Wet prep, genital     Status: Abnormal   Collection Time: 04/13/18  2:10 PM  Result Value Ref Range   Yeast Wet Prep HPF POC NONE SEEN NONE SEEN   Trich, Wet Prep NONE SEEN NONE SEEN   Clue Cells Wet Prep HPF POC PRESENT (A) NONE SEEN   WBC, Wet Prep HPF POC MODERATE (A) NONE SEEN   Sperm NONE SEEN    US Ob Less Than 14 Weeks With Ob Transvaginal  Result Date: 04/13/2018 CLINICAL DATA:  First trimester of pregnancy, vaginal bleeding. EXAM: OBSTETRIC <14 WK [place] AND TRANSVAGINAL OB US TECHNIQUE: Both transabdominal and transvaginal ultrasound examinations were performed for complete evaluation of the gestation as well as the maternal uterus, adnexal regions, and pelvic cul-de-sac. Transvaginal technique was performed to assess early pregnancy. COMPARISON:  None. FINDINGS: Intrauterine gestational sac: Single visualized. Yolk sac:  Single. Embryo:  Single. Cardiac Activity: Single. Heart Rate: 108 bpm MSD: 13.6 mm   6 w   1 d CRL:  7.1 mm   5 w   5 d                  [place] EDC: December 09, 2018. Subchorionic hemorrhage:  None visualized. Maternal uterus/adnexae: Probable corpus luteum cyst seen in right ovary. Left ovary is unremarkable. Small amount of free fluid is noted which most likely is physiologic. IMPRESSION: Single live intrauterine gestation of 5 weeks 5 days. Electronically Signed   By: Lupita Raider, M.D.   On: 04/13/2018 14:28   MDM UA, UPT CBC, HCG ABO/Rh- A Pos Wet prep and gc/chlamydia US OB Comp Less 14 weeks with Transvaginal  Clue cells on wet prep but no odor or discharge. Will hold off on treatment at this time.  Lab unable to draw blood work after multiple sticks. Discussed with patient reasons for blood work and patient declined further sticks. Will get ultrasound. Normal IUP, patient declines blood work.   Assessment  and Plan   1. Normal intrauterine pregnancy on prenatal ultrasound in first trimester   2. Vaginal bleeding affecting early pregnancy    -Discharge home in stable condition -First trimester precautions discussed -Patient advised to follow-up with [facility] as scheduled for her New OB -Patient may return to MAU as needed or if her condition were to change or worsen  Rolm Bookbinder CNM 04/13/2018, 3:15 PM

## 2018-04-13 NOTE — Progress Notes (Signed)
Vaginal cultures obtained by C. Neill, CNM 

## 2018-04-15 LAB — GC/CHLAMYDIA PROBE AMP (~~LOC~~) NOT AT ARMC
Chlamydia: NEGATIVE
NEISSERIA GONORRHEA: NEGATIVE

## 2018-04-26 ENCOUNTER — Inpatient Hospital Stay (HOSPITAL_COMMUNITY)
Admission: AD | Admit: 2018-04-26 | Discharge: 2018-04-27 | Disposition: A | Discharge status: Home / Self Care | Payer: Medicaid Other | Attending: Obstetrics and Gynecology | Admitting: Obstetrics and Gynecology

## 2018-04-26 ENCOUNTER — Other Ambulatory Visit: Payer: Self-pay

## 2018-04-26 ENCOUNTER — Encounter (HOSPITAL_COMMUNITY): Payer: Self-pay | Admitting: *Deleted

## 2018-04-26 DIAGNOSIS — Z3A08 8 weeks gestation of pregnancy: Secondary | ICD-10-CM | POA: Diagnosis not present

## 2018-04-26 DIAGNOSIS — R51 Headache: Secondary | ICD-10-CM | POA: Insufficient documentation

## 2018-04-26 DIAGNOSIS — Z3491 Encounter for supervision of normal pregnancy, unspecified, first trimester: Secondary | ICD-10-CM

## 2018-04-26 DIAGNOSIS — O26891 Other specified pregnancy related conditions, first trimester: Secondary | ICD-10-CM | POA: Insufficient documentation

## 2018-04-26 DIAGNOSIS — R109 Unspecified abdominal pain: Secondary | ICD-10-CM | POA: Insufficient documentation

## 2018-04-26 MED ORDER — ACETAMINOPHEN 500 MG PO TABS
1000.0000 mg | ORAL_TABLET | Freq: Once | ORAL | Status: AC
  Administered 2018-04-26: 23:00:00 1000 mg via ORAL
  Filled 2018-04-26: qty 2

## 2018-04-26 NOTE — MAU Note (Signed)
Pt reports to MAU c/o HA and dizziness pt reports a HA of 7/10 right now and states she has not taken tylenol tonight but has tried it in the last few days but at night before bed so she is unsure if it is working. Pt expresses fear of taking medication constantly. Pt reports some brown spotting as well that started this morning. No recent intercourse. Pt reports in the last few hours she has developed a pain in her right side of her lower abdomen.

## 2018-04-26 NOTE — MAU Provider Note (Signed)
History     CSN: [id number]  Arrival date and time: 04/26/18 2225   First Provider Initiated Contact with Patient 04/26/18 2258      Chief Complaint  Patient presents with  . Headache   HPI Shirley Greene is a 23 y.o. G2P1001 at [redacted]w[redacted]d who presents with multiple complaints. She reports a headache for the last week. She tried tylenol at night but unsure if it helps because she sleeps after taking it. She does not like to take medication. She reports eating and drinking normally. She also reports intermittent sharp shooting abdominal pain on her right side. She states it is worse with movement. She reports brown on the tissue when she wipes but no bright red bleeding or abnormal discharge.   OB History    Gravida  2   Para  1   Term  1   Preterm      AB      Living  1     SAB      TAB      Ectopic      Multiple      Live Births  1        Obstetric Comments  "OP" presentation, got stuck at 9cm        Past Medical History:  Diagnosis Date  . Anemia   . Chlamydia   . Infection    UTI    Past Surgical History:  Procedure Laterality Date  . CESAREAN SECTION N/A 04/05/2012   Procedure: CESAREAN SECTION;  Surgeon: Tilda Burrow, MD;  Location: [facility];  Service: Obstetrics;  Laterality: N/A;    Family History  Problem Relation Age of Onset  . Hypertension Mother   . Hypertension Father   . Cancer Maternal Grandfather     Social History   Tobacco Use  . Smoking status: Never Smoker  . Smokeless tobacco: Never Used  Substance Use Topics  . Alcohol use: Not Currently    Comment: twice a month  . Drug use: No    Allergies: No Known Allergies  No medications prior to admission.    Review of Systems  Constitutional: Negative.  Negative for fatigue and fever.  HENT: Negative.   Respiratory: Negative.  Negative for shortness of breath.   Cardiovascular: Negative.  Negative for chest pain.  Gastrointestinal: Positive for abdominal pain. Negative  for constipation, diarrhea, nausea and vomiting.  Genitourinary: Positive for vaginal bleeding. Negative for dysuria.  Neurological: Positive for headaches. Negative for dizziness.   Physical Exam   Blood pressure 106/67, pulse 78, temperature 98.3 F (36.8 C), temperature source Oral, resp. rate 16, last menstrual period 03/01/2018, SpO2 100 %.  Physical Exam  Nursing note and vitals reviewed. Constitutional: She is oriented to person, place, and time. She appears well-developed and well-nourished. No distress.  HENT:  Head: Normocephalic.  Eyes: Pupils are equal, round, and reactive to light.  Cardiovascular: Normal rate, regular rhythm and normal heart sounds.  Respiratory: Effort normal and breath sounds normal. No respiratory distress.  GI: Soft. Bowel sounds are normal. She exhibits no distension and no mass. There is no abdominal tenderness. There is no rebound and no guarding.  Neurological: She is alert and oriented to person, place, and time.  Skin: Skin is warm and dry.  Psychiatric: She has a normal mood and affect. Her behavior is normal. Judgment and thought content normal.    MAU Course  Procedures Results for orders placed or performed during the hospital encounter of  04/26/18 (from the past 24 hour(s))  CBC with Differential/Platelet     Status: Abnormal   Collection Time: 04/27/18 12:03 AM  Result Value Ref Range   WBC 9.2 4.0 - 10.5 K/uL   RBC 4.99 3.87 - 5.11 MIL/uL   Hemoglobin 12.9 12.0 - 15.0 g/dL   HCT 40.938.2 81.136.0 - 91.446.0 %   MCV 76.6 (L) 80.0 - 100.0 fL   MCH 25.9 (L) 26.0 - 34.0 pg   MCHC 33.8 30.0 - 36.0 g/dL   RDW 78.214.5 95.611.5 - 21.315.5 %   Platelets PLATELET CLUMPS NOTED ON SMEAR, UNABLE TO ESTIMATE 150 - 400 K/uL   nRBC 0.0 0.0 - 0.2 %   Neutrophils Relative % 56 %   Lymphocytes Relative 35 %   Monocytes Relative 6 %   Eosinophils Relative 2 %   Basophils Relative 1 %   Band Neutrophils 0 %   Metamyelocytes Relative 0 %   Myelocytes 0 %    Promyelocytes Relative 0 %   Blasts 0 %   nRBC 0 0 /100 WBC   Other 0 %   Neutro Abs 5.1 1.7 - 7.7 K/uL   Lymphs Abs 3.2 0.7 - 4.0 K/uL   Monocytes Absolute 0.6 0.1 - 1.0 K/uL   Eosinophils Absolute 0.2 0.0 - 0.5 K/uL   Basophils Absolute 0.1 0.0 - 0.1 K/uL   Smear Review PLATELET CLUMPS NOTED ON SMEAR      MDM UA, UC CBC with Diff Tylenol 1000mg  PO  Limited bedside u/s confirms IUP with FHR of 154 bpm Low suspicion for appendicitis due to location of pain and absence of fever, leukocytosis and GI complaints.   Assessment and Plan   1. Pregnancy headache in first trimester   2. Presence of fetal heart sounds in first trimester    -Discharge home in stable condition -Encouraged patient to increase PO hydration and take tylenol as needed for HA -First trimester precautions discussed -Patient [facility]  as scheduled to start prenatal care -Patient may return to MAU as needed or if her condition were to change or worsen   Rolm BookbinderCaroline M Shaylon Gillean CNM 04/27/2018, 12:57 AM

## 2018-04-27 DIAGNOSIS — R51 Headache: Secondary | ICD-10-CM

## 2018-04-27 DIAGNOSIS — Z3A08 8 weeks gestation of pregnancy: Secondary | ICD-10-CM | POA: Diagnosis not present

## 2018-04-27 DIAGNOSIS — O26891 Other specified pregnancy related conditions, first trimester: Secondary | ICD-10-CM | POA: Diagnosis not present

## 2018-04-27 LAB — URINALYSIS, ROUTINE W REFLEX MICROSCOPIC
Bilirubin Urine: NEGATIVE
Glucose, UA: NEGATIVE mg/dL
Ketones, ur: NEGATIVE mg/dL
Nitrite: NEGATIVE
Protein, ur: NEGATIVE mg/dL
Specific Gravity, Urine: 1.024 (ref 1.005–1.030)
pH: 5 (ref 5.0–8.0)

## 2018-04-27 LAB — CBC WITH DIFFERENTIAL/PLATELET
Band Neutrophils: 0 %
Basophils Absolute: 0.1 10*3/uL (ref 0.0–0.1)
Basophils Relative: 1 %
Blasts: 0 %
Eosinophils Absolute: 0.2 10*3/uL (ref 0.0–0.5)
Eosinophils Relative: 2 %
HCT: 38.2 % (ref 36.0–46.0)
Hemoglobin: 12.9 g/dL (ref 12.0–15.0)
Lymphocytes Relative: 35 %
Lymphs Abs: 3.2 10*3/uL (ref 0.7–4.0)
MCH: 25.9 pg — ABNORMAL LOW (ref 26.0–34.0)
MCHC: 33.8 g/dL (ref 30.0–36.0)
MCV: 76.6 fL — ABNORMAL LOW (ref 80.0–100.0)
Metamyelocytes Relative: 0 %
Monocytes Absolute: 0.6 10*3/uL (ref 0.1–1.0)
Monocytes Relative: 6 %
Myelocytes: 0 %
Neutro Abs: 5.1 10*3/uL (ref 1.7–7.7)
Neutrophils Relative %: 56 %
Other: 0 %
Platelets: UNDETERMINED 10*3/uL (ref 150–400)
Promyelocytes Relative: 0 %
RBC: 4.99 MIL/uL (ref 3.87–5.11)
RDW: 14.5 % (ref 11.5–15.5)
WBC: 9.2 10*3/uL (ref 4.0–10.5)
nRBC: 0 % (ref 0.0–0.2)
nRBC: 0 /100 WBC

## 2018-04-27 NOTE — Discharge Instructions (Signed)
Safe Medications in Pregnancy  ° °Acne: °Benzoyl Peroxide °Salicylic Acid ° °Backache/Headache: °Tylenol: 2 regular strength every 4 hours OR °             2 Extra strength every 6 hours ° °Colds/Coughs/Allergies: °Benadryl (alcohol free) 25 mg every 6 hours as needed °Breath right strips °Claritin °Cepacol throat lozenges °Chloraseptic throat spray °Cold-Eeze- up to three times per day °Cough drops, alcohol free °Flonase (by prescription only) °Guaifenesin °Mucinex °Robitussin DM (plain only, alcohol free) °Saline nasal spray/drops °Sudafed (pseudoephedrine) & Actifed ** use only after [redacted] weeks gestation and if you do not have high blood pressure °Tylenol °Vicks Vaporub °Zinc lozenges °Zyrtec  ° °Constipation: °Colace °Ducolax suppositories °Fleet enema °Glycerin suppositories °Metamucil °Milk of magnesia °Miralax °Senokot °Smooth move tea ° °Diarrhea: °Kaopectate °Imodium A-D ° °*NO pepto Bismol ° °Hemorrhoids: °Anusol °Anusol HC °Preparation H °Tucks ° °Indigestion: °Tums °Maalox °Mylanta °Zantac  °Pepcid ° °Insomnia: °Benadryl (alcohol free) 25mg every 6 hours as needed °Tylenol PM °Unisom, no Gelcaps ° °Leg Cramps: °Tums °MagGel ° °Nausea/Vomiting:  °Bonine °Dramamine °Emetrol °Ginger extract °Sea bands °Meclizine  °Nausea medication to take during pregnancy:  °Unisom (doxylamine succinate 25 mg tablets) Take one tablet daily at bedtime. If symptoms are not adequately controlled, the dose can be increased to a maximum recommended dose of two tablets daily (1/2 tablet in the morning, 1/2 tablet mid-afternoon and one at bedtime). °Vitamin B6 100mg tablets. Take one tablet twice a day (up to 200 mg per day). ° °Skin Rashes: °Aveeno products °Benadryl cream or 25mg every 6 hours as needed °Calamine Lotion °1% cortisone cream ° °Yeast infection: °Gyne-lotrimin 7 °Monistat 7 ° ° °**If taking multiple medications, please check labels to avoid duplicating the same active ingredients °**take medication as directed on  the label °** Do not exceed 4000 mg of tylenol in 24 hours °**Do not take medications that contain aspirin or ibuprofen ° ° ° °Social Distancing FAQ: How It Helps Prevent COVID-19 (Coronavirus) and Steps We Can Take to Protect Ourselves °There are many things we can do to prevent the spread of COVID-19 (coronavirus): washing our hands, coughing into our elbows, avoiding touching our faces, staying home if we're feeling sick and social distancing. But what is social distancing? These frequently asked questions explain how to practice social distancing in order to protect yourself, your loved ones and your community. °General Information About Social Distancing °Q: What is social distancing? °A: Social distancing is the practice of purposefully reducing close contact between people. According to the CDC, social distancing means: °• Remaining out of “congregate settings” as much as possible. °• Avoiding mass gatherings. °• Maintaining distance of about 6 feet from others when possible. °Q: Why is social distancing important? °A: Social distancing is crucial for preventing the spread of contagious illnesses such as COVID-19 (coronavirus). COVID-19 can spread through coughing, sneezing and close contact. By minimizing the amount of close contact we have with others, we reduce our chances of catching the virus and spreading it to our loved ones and within our community. °Q: Who is social distancing important for? °A: Social distancing is important for all of us, but those of us who are at higher risk of serious complications caused by COVID-19 should be especially cautious about social distancing. People who are at high risk of complications include: °• Older adults. °• People who have serious chronic medical conditions like heart disease, diabetes and lung disease. °Q: What is “flattening the curve”? What does it have to   do with social distancing? °A: “Flattening the curve” refers to reducing the number of people who are  sick at one time. If there are high surges in the number of COVID-19 cases all at once, health care systems and resources could potentially become overwhelmed. Efforts that help stop COVID-19 from spreading rapidly - like social distancing - help keep the number of people who are sick at one time as low as possible. °Q: When should I start practicing social distancing? °A: The best time to begin social distancing is before an illness like COVID-19 becomes widespread throughout your community. Each community's situation is unique, so it is important to follow the guidance of local government, health departments and health care providers. Currently in [place], gatherings of more than 10 people are banned. For the latest information on COVID-19 policies in [place], visit the [place] Department of Health and Human Services website and view news releases and executive orders. °The advice below is applicable if you are symptom-free and have reasonable confidence that you have not had exposure to COVID-19. Always follow the guidance of local government, health departments and your health care providers. °Visiting Public Spaces °Q: How can I practice social distancing in the workplace? °A: When possible, keeping about 6 feet of distance between yourself and others is key. It's also important to practice other preventative measures such as washing hands, avoiding touching your face, coughing into your elbow and staying home if you feel sick. Depending on your job and your community's situation, working from home may be an option. Always follow local guidance. °Q: How can my child practice social distancing at school or at college?  °A: Many schools and universities in the [place] have postponed in-person classes; currently in [place], K-12 schools have been closed until May 15 (this is subject to change). It's important to follow local guidance, as each community's situation is unique. It's important  for people of all ages to follow preventative measures, including staying about 6 feet away from others, avoiding touching your face, washing your hands and coughing into your elbow. °Q: Should I be concerned about going to the grocery store? °A: In any place where large numbers of people gather, there is potential risk for disease transmission. When you visit the grocery store, keep about 6 feet between yourself and others and use prevention techniques like avoiding touching your face and washing your hands. If possible, visit the store at times when there are likely to be fewer people shopping. °Q: Should I take public transportation? °A: If you have the option, driving yourself, walking to work or working from home can help reduce the number of people who are using public transportation, which benefits you and your community. In any of these situations, it's very important to keep distance between yourself and others in addition to practicing other preventative measures. °Q: Should I stop visiting restaurants and bars? °A: Currently in [place], restaurants and bars have been closed until further notice. Always follow local guidance. Avoiding public places as much as possible helps prevent diseases from spreading. If dining out is a non-essential activity, then it is generally in the best interest of you and your loved ones to avoid it. °Q: Can I still go to the gym? °A: Currently in [place], gyms have been closed until further notice. Always follow local guidance. Alternatives could include exercising in your home or yard or walking through your neighborhood. If you do go to the gym, wipe down   and sanitize your equipment, keep distance between yourself and others, avoid touching other people and practice other preventative techniques.  °Q: What about events and places where many people gather, such as concerts, festivals, sporting events and churches? °A: Risk of disease transmission is much higher  in large groups of people. Effective March 25 at 5 PM in [place], meetings of over 10 people are prohibited in order to prevent COVID-19 from spreading rapidly. Avoiding these gatherings are generally in the best interest of protecting yourself, your loved ones and your community. Always follow local guidance. °Meeting with Others °Q: Should I stop visiting my elderly relatives and friends?  °A: Older adults are at high risk of serious complications from COVID-19. Limiting their exposure as much as possible to those who may be sick or who may be carrying the disease is crucial. This is a great opportunity to try other methods of connecting, such as over the phone or through a video chat. °Q: What about social distancing with other people in my household? °A: Avoiding close contact within a household is almost impossible, and social distancing is mainly focused on large groups. However, if someone in your household is sick, it's important to minimize close contact with them as much as is reasonable. °Q: Should I stop meeting up with 1 or 2 friends? Should I stop dating? °A: While meeting up with another person who also is symptom-free may be alright in some situations, keep in mind that risk of disease transmission is higher in public places. Now may be a good time to consider other methods of connecting with others, such as over the phone or through a video chat. °Q: Can I have a small group of my extended family and/or friends over to my house? °A: If possible, it's best to postpone these kinds of gatherings and look for alternative ways to connect. Avoiding non-essential gatherings is important for preventing the spread of disease. °Q: Should my family and friends cancel big gathering events like weddings and birthday parties? °A: Effective March 25 at 5 PM in [place], meetings of over 10 people are prohibited in order to prevent COVID-19 from spreading rapidly. Always follow local guidance. While  it's difficult to postpone important events like these, it's also very important to protect our loved ones - especially our loved ones who are most vulnerable. It may be best to postpone or alter your plans.  °Q: If I'm avoiding in-person gatherings, how can I stay connected to others? °A: There are many ways you can connect with friends: phone calls, text messages, emails and video chats are all great virtual options. While physical social distancing is important for our health, so is social interaction - trying alternative ways to stay connected is a good way to take care of your emotional health. °If You Are Experiencing Symptoms °Q: How should I approach social distancing if I start to feel sick? °A: If you begin to experience symptoms, it's important to stay home and distance yourself from others. Always follow the guidance of your health care providers and local government. °Q: Can I have visitors while I am in quarantine? °A: Having visitors should be avoided as much as possible. Quarantining is an important way to protect your loved ones and community from picking up the disease; visitors are at high risk of catching the illness from you. °Q: Can I go outside in my yard if I am in quarantine? °A: Depending on where you live and your community's   situation, going outside and getting some fresh air in your yard may be safe. Always follow the guidance of your health care providers and local government. ° ° °

## 2018-04-28 LAB — CULTURE, OB URINE: Culture: NO GROWTH

## 2018-05-16 ENCOUNTER — Encounter: Payer: Self-pay | Admitting: Obstetrics and Gynecology

## 2018-05-16 ENCOUNTER — Ambulatory Visit (INDEPENDENT_AMBULATORY_CARE_PROVIDER_SITE_OTHER): Payer: Medicaid Other | Admitting: Obstetrics and Gynecology

## 2018-05-16 ENCOUNTER — Other Ambulatory Visit: Payer: Self-pay

## 2018-05-16 DIAGNOSIS — Z3A10 10 weeks gestation of pregnancy: Secondary | ICD-10-CM

## 2018-05-16 DIAGNOSIS — Z3480 Encounter for supervision of other normal pregnancy, unspecified trimester: Secondary | ICD-10-CM | POA: Diagnosis not present

## 2018-05-16 DIAGNOSIS — Z98891 History of uterine scar from previous surgery: Secondary | ICD-10-CM

## 2018-05-16 DIAGNOSIS — Z3481 Encounter for supervision of other normal pregnancy, first trimester: Secondary | ICD-10-CM | POA: Diagnosis not present

## 2018-05-16 DIAGNOSIS — O26891 Other specified pregnancy related conditions, first trimester: Secondary | ICD-10-CM

## 2018-05-16 DIAGNOSIS — N879 Dysplasia of cervix uteri, unspecified: Secondary | ICD-10-CM

## 2018-05-16 HISTORY — DX: History of uterine scar from previous surgery: Z98.891

## 2018-05-16 HISTORY — DX: Encounter for supervision of other normal pregnancy, unspecified trimester: Z34.80

## 2018-05-16 NOTE — Progress Notes (Signed)
Last pap 02/26/2018 normal

## 2018-05-16 NOTE — Progress Notes (Signed)
New OB Note  05/16/2018   [facility]  Chief Complaint: NOB  Transfer of Care Patient: no  History of Present Illness: Ms. Shirley Greene is a 23 y.o. G2P1001 @ 10/3 weeks (EDC 11/23, based on Patient's last menstrual period was 03/04/2018.=5wk u/s).  Preg complicated by has Cervical dysplasia; Supervision of other normal pregnancy, antepartum; and History of cesarean delivery on their problem list.   Any events prior to today's visit: no Her periods were: qmonth, regular She has mild signs or symptoms of nausea/vomiting of pregnancy (nausea only). She has Negative signs or symptoms of miscarriage or preterm labor  ROS: A 12-point review of systems was performed and negative, except as stated in the above HPI.  OBGYN History: As per HPI. OB History  Gravida Para Term Preterm AB Living  2 1 1     1   SAB TAB Ectopic Multiple Live Births          1    # Outcome Date GA Lbr Len/2nd Weight Sex Delivery Anes PTL Lv  2 Current           1 Term 04/05/12 5235w1d  7 lb 14.8 oz (3.595 kg) M CS-LTranv EPI  LIV    Obstetric Comments  "OP" presentation, got stuck at 9cm    Any issues with any prior pregnancies: no Prior children are healthy, doing well, and without any problems or issues: yes History of pap smears: Yes. Last pap smear 2020 and results were negative   Past Medical History: Past Medical History:  Diagnosis Date  . Anemia   . Chlamydia   . Infection    UTI    Past Surgical History: Past Surgical History:  Procedure Laterality Date  . CESAREAN SECTION N/A 04/05/2012   Procedure: CESAREAN SECTION;  Surgeon: Tilda BurrowJohn V Ferguson, MD;  Location: [facility];  Service: Obstetrics;  Laterality: N/A;    Family History:  Family History  Problem Relation Age of Onset  . Hypertension Mother   . Hypertension Father   . Cancer Maternal Grandfather    She denies any history of mental retardation, birth defects or genetic disorders in her or the FOB's  history  Social History:  Social History   Socioeconomic History  . Marital status: Single    Spouse name: Not on file  . Number of children: Not on file  . Years of education: Not on file  . Highest education level: Not on file  Occupational History  . Not on file  Social Needs  . Financial resource strain: Not on file  . Food insecurity:    Worry: Not on file    Inability: Not on file  . Transportation needs:    Medical: Not on file    Non-medical: Not on file  Tobacco Use  . Smoking status: Never Smoker  . Smokeless tobacco: Never Used  Substance and Sexual Activity  . Alcohol use: Not Currently    Comment: twice a month  . Drug use: No  . Sexual activity: Yes    Birth control/protection: None  Lifestyle  . Physical activity:    Days per week: Not on file    Minutes per session: Not on file  . Stress: Not on file  Relationships  . Social connections:    Talks on phone: Not on file    Gets together: Not on file    Attends religious service: Not on file    Active member of club or organization: Not on file  Attends meetings of clubs or organizations: Not on file    Relationship status: Not on file  . Intimate partner violence:    Fear of current or ex partner: Not on file    Emotionally abused: Not on file    Physically abused: Not on file    Forced sexual activity: Not on file  Other Topics Concern  . Not on file  Social History Narrative  . Not on file    Allergy: No Known Allergies  Health Maintenance:  Mammogram Up to Date: not applicable  Current Outpatient Medications: PNV Physical Exam:   BP 114/74   Pulse 72   Wt 141 lb 12.8 oz (64.3 kg)   LMP 03/04/2018   BMI 27.69 kg/m  Body mass index is 27.69 kg/m.    Marland Kitchen Fundal height: not applicable FHTs: 150s  General appearance: Well nourished, well developed female in no acute distress.  Neck:  Supple, normal appearance, and no thyromegaly  Cardiovascular: S1, S2 normal, no murmur, rub  or gallop, regular rate and rhythm Respiratory:  Clear to auscultation bilateral. Normal respiratory effort Abdomen: positive bowel sounds and no masses, hernias; diffusely non tender to palpation, non distended Breasts: pt declines any breast s/s Neuro/Psych:  Normal mood and affect.  Skin:  Warm and dry.   Laboratory: Reviewed from MAU  Imaging:  Reviewed. Bedside u/s with SLIUP and CRL c/w 10/5 weeks  Assessment: pt doing well  Plan: 1. Supervision of other normal pregnancy, antepartum Routine care. Schedule anatomy u/s at nv. Pt amenable to genetics today.  - Obstetric Panel, Including HIV - Genetic Screening - Babyscripts Schedule Optimization - Enroll Patient in Babyscripts  2. Cervical dysplasia Normal in 2020  3. 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 D/w her that she is eligible for tolac. Can d/w her later in pregnancy  Problem list reviewed and updated.  Follow up in 3 weeks.   >50% of 20 min visit spent on counseling and coordination of care.     Cornelia Copa MD Attending [facility])

## 2018-05-17 LAB — OBSTETRIC PANEL, INCLUDING HIV
Antibody Screen: NEGATIVE
Basophils Absolute: 0 10*3/uL (ref 0.0–0.2)
Basos: 0 %
EOS (ABSOLUTE): 0.1 10*3/uL (ref 0.0–0.4)
Eos: 2 %
HIV Screen 4th Generation wRfx: NONREACTIVE
Hematocrit: 40.8 % (ref 34.0–46.6)
Hemoglobin: 13.2 g/dL (ref 11.1–15.9)
Hepatitis B Surface Ag: NEGATIVE
Immature Grans (Abs): 0 10*3/uL (ref 0.0–0.1)
Immature Granulocytes: 0 %
Lymphocytes Absolute: 1.6 10*3/uL (ref 0.7–3.1)
Lymphs: 24 %
MCH: 25.9 pg — ABNORMAL LOW (ref 26.6–33.0)
MCHC: 32.4 g/dL (ref 31.5–35.7)
MCV: 80 fL (ref 79–97)
Monocytes Absolute: 0.3 10*3/uL (ref 0.1–0.9)
Monocytes: 5 %
Neutrophils Absolute: 4.6 10*3/uL (ref 1.4–7.0)
Neutrophils: 69 %
Platelets: 299 10*3/uL (ref 150–450)
RBC: 5.1 x10E6/uL (ref 3.77–5.28)
RDW: 14.5 % (ref 11.7–15.4)
RPR Ser Ql: NONREACTIVE
Rh Factor: POSITIVE
Rubella Antibodies, IGG: 3.37 index (ref >0.99)
WBC: 6.7 10*3/uL (ref 3.4–10.8)

## 2018-05-27 ENCOUNTER — Encounter: Payer: Self-pay | Admitting: Obstetrics and Gynecology

## 2018-05-27 ENCOUNTER — Encounter: Payer: Self-pay | Admitting: *Deleted

## 2018-05-27 ENCOUNTER — Telehealth: Payer: Self-pay | Admitting: *Deleted

## 2018-05-27 DIAGNOSIS — D563 Thalassemia minor: Secondary | ICD-10-CM

## 2018-05-27 HISTORY — DX: Thalassemia minor: D56.3

## 2018-05-27 NOTE — Telephone Encounter (Signed)
Pt informed of panorama results and carrier screen results. Informed that its recommended that due to her results, that her partner get tested as well. She will discuss with him and call us back with his information. Pt will pick up fetal sex results in an envelop.

## 2018-06-04 ENCOUNTER — Other Ambulatory Visit: Payer: Self-pay

## 2018-06-04 ENCOUNTER — Ambulatory Visit (INDEPENDENT_AMBULATORY_CARE_PROVIDER_SITE_OTHER): Payer: Medicaid Other | Admitting: Obstetrics and Gynecology

## 2018-06-04 VITALS — BP 124/78 | HR 72 | Wt 148.2 lb

## 2018-06-04 DIAGNOSIS — O26899 Other specified pregnancy related conditions, unspecified trimester: Secondary | ICD-10-CM

## 2018-06-04 DIAGNOSIS — Z3A31 31 weeks gestation of pregnancy: Secondary | ICD-10-CM

## 2018-06-04 DIAGNOSIS — Z3480 Encounter for supervision of other normal pregnancy, unspecified trimester: Secondary | ICD-10-CM

## 2018-06-04 DIAGNOSIS — R109 Unspecified abdominal pain: Secondary | ICD-10-CM

## 2018-06-04 NOTE — Progress Notes (Signed)
Prenatal Visit Note Date: 06/04/2018 [facility]: [facility]  Work In Visit  Subjective:  Katiana Cappiello is a 23 y.o. G2P1001 at [redacted]w[redacted]d being seen today for ongoing prenatal care.  She is currently monitored for the following issues for this low-risk pregnancy and has Cervical dysplasia; Supervision of other normal pregnancy, antepartum; History of cesarean delivery; and Alpha thalassemia silent carrier on their problem list.  Patient reports cramping since sunday. ? worse. no VB.    . Vag. Bleeding: None.  Movement: Present. Denies leaking of fluid.   The following portions of the patient's history were reviewed and updated as appropriate: allergies, current medications, past family history, past medical history, past social history, past surgical history and problem list. Problem list updated.  Objective:   Vitals:   06/04/18 1404  BP: 124/78  Pulse: 72  Weight: 148 lb 3.2 oz (67.2 kg)    Fetal Status: Fetal Heart Rate (bpm): 165   Movement: Present     General:  Alert, oriented and cooperative. Patient is in no acute distress.  Skin: Skin is warm and dry. No rash noted.   Cardiovascular: Normal heart rate noted  Respiratory: Normal respiratory effort, no problems with respiration noted  Abdomen: Soft, gravid, appropriate for gestational age. Pain/Pressure: Absent     Pelvic:  Cervical exam performed  EGBUS, vault, cervix normal Dilation: Closed Effacement (%): Thick Station: Ballotable  Extremities: Normal range of motion.     Mental Status: Normal mood and affect. Normal behavior. Normal judgment and thought content.   Urinalysis:    neg  Assessment and Plan:  Pregnancy: G2P1001 at [redacted]w[redacted]d  1. Cramping affecting pregnancy, antepartum No s/s of SAB. Precautions given  2. Supervision of other normal pregnancy, antepartum Routine anatomy at 18-19wks - [place] MFM OB DETAIL +14 WK; Future  Preterm labor symptoms and general obstetric precautions including but  not limited to vaginal bleeding, contractions, leaking of fluid and fetal movement were reviewed in detail with the patient. Please refer to After Visit Summary for other counseling recommendations.  Return in about 6 weeks (around 07/16/2018).   [place] Bing, MD

## 2018-06-06 ENCOUNTER — Encounter: Payer: Medicaid Other | Admitting: Obstetrics & Gynecology

## 2018-06-19 ENCOUNTER — Telehealth: Payer: Self-pay | Admitting: *Deleted

## 2018-06-19 ENCOUNTER — Other Ambulatory Visit: Payer: Self-pay

## 2018-06-19 ENCOUNTER — Other Ambulatory Visit (HOSPITAL_COMMUNITY)
Admission: RE | Admit: 2018-06-19 | Discharge: 2018-06-19 | Disposition: A | Discharge status: Home / Self Care | Payer: Medicaid Other | Source: Ambulatory Visit | Attending: Obstetrics and Gynecology | Admitting: Obstetrics and Gynecology

## 2018-06-19 ENCOUNTER — Ambulatory Visit (INDEPENDENT_AMBULATORY_CARE_PROVIDER_SITE_OTHER): Payer: Medicaid Other | Admitting: *Deleted

## 2018-06-19 VITALS — BP 111/76 | HR 101

## 2018-06-19 DIAGNOSIS — N898 Other specified noninflammatory disorders of vagina: Secondary | ICD-10-CM

## 2018-06-19 DIAGNOSIS — R3915 Urgency of urination: Secondary | ICD-10-CM | POA: Diagnosis not present

## 2018-06-19 LAB — POCT URINALYSIS DIPSTICK: Blood, UA: NEGATIVE

## 2018-06-19 MED ORDER — PHENAZOPYRIDINE HCL 200 MG PO TABS: 200.0000 mg | ORAL_TABLET | Freq: Three times a day (TID) | ORAL | 0 refills | Status: DC | PRN

## 2018-06-19 MED ORDER — NITROFURANTOIN MONOHYD MACRO 100 MG PO CAPS: 100.0000 mg | ORAL_CAPSULE | Freq: Two times a day (BID) | ORAL | 0 refills | Status: DC

## 2018-06-19 NOTE — Telephone Encounter (Signed)
Called pt to let her know I sent in RX for UTI into her pharmacy

## 2018-06-19 NOTE — Progress Notes (Signed)
SUBJECTIVE: Shirley Greene is a 23 y.o. female who complains of urinary urgency, vaginal irritation and some cramping. Denies flank pain, fever, chills, or abnormal vaginal discharge or bleeding. Pt concerned she has a UTI and a yeast infection.   OBJECTIVE: Appears well, in no apparent distress.  Vital signs are normal. Urine dipstick shows positive for leukocytes.    ASSESSMENT: Dysuria  PLAN: Self swab for BV and CVAG performed and sent to lab. Treatment per protocol.  Call or return to clinic prn if these symptoms worsen or fail to improve as anticipated.   Scheryl Marten, RN

## 2018-06-20 LAB — CERVICOVAGINAL ANCILLARY ONLY
Bacterial vaginitis: NEGATIVE
Candida vaginitis: NEGATIVE

## 2018-06-21 LAB — CULTURE, OB URINE

## 2018-06-21 LAB — URINE CULTURE, OB REFLEX

## 2018-06-24 NOTE — Progress Notes (Signed)
I have reviewed the chart and agree with nursing staff's documentation of this patient's encounter.  Aldred Mase, MD 06/24/2018 8:59 AM    

## 2018-07-15 ENCOUNTER — Encounter (HOSPITAL_COMMUNITY): Payer: Self-pay

## 2018-07-15 ENCOUNTER — Other Ambulatory Visit (HOSPITAL_COMMUNITY): Payer: Self-pay | Admitting: *Deleted

## 2018-07-15 ENCOUNTER — Ambulatory Visit (HOSPITAL_COMMUNITY)
Admission: RE | Admit: 2018-07-15 | Discharge: 2018-07-15 | Disposition: A | Discharge status: Home / Self Care | Payer: Medicaid Other | Source: Ambulatory Visit | Attending: Obstetrics and Gynecology | Admitting: Obstetrics and Gynecology

## 2018-07-15 ENCOUNTER — Other Ambulatory Visit: Payer: Self-pay

## 2018-07-15 ENCOUNTER — Ambulatory Visit (HOSPITAL_COMMUNITY): Payer: Medicaid Other | Admitting: *Deleted

## 2018-07-15 VITALS — BP 109/63 | HR 102 | Temp 98.4°F

## 2018-07-15 DIAGNOSIS — Z3A19 19 weeks gestation of pregnancy: Secondary | ICD-10-CM | POA: Diagnosis not present

## 2018-07-15 DIAGNOSIS — O0990 Supervision of high risk pregnancy, unspecified, unspecified trimester: Secondary | ICD-10-CM | POA: Diagnosis not present

## 2018-07-15 DIAGNOSIS — Z363 Encounter for antenatal screening for malformations: Secondary | ICD-10-CM | POA: Diagnosis not present

## 2018-07-15 DIAGNOSIS — O99012 Anemia complicating pregnancy, second trimester: Secondary | ICD-10-CM | POA: Diagnosis not present

## 2018-07-15 DIAGNOSIS — Z362 Encounter for other antenatal screening follow-up: Secondary | ICD-10-CM

## 2018-07-15 DIAGNOSIS — O34219 Maternal care for unspecified type scar from previous cesarean delivery: Secondary | ICD-10-CM | POA: Diagnosis not present

## 2018-07-15 DIAGNOSIS — Z3480 Encounter for supervision of other normal pregnancy, unspecified trimester: Secondary | ICD-10-CM | POA: Diagnosis not present

## 2018-07-16 ENCOUNTER — Telehealth (INDEPENDENT_AMBULATORY_CARE_PROVIDER_SITE_OTHER): Payer: Medicaid Other | Admitting: Family Medicine

## 2018-07-16 VITALS — BP 103/67

## 2018-07-16 DIAGNOSIS — Z3480 Encounter for supervision of other normal pregnancy, unspecified trimester: Secondary | ICD-10-CM

## 2018-07-16 DIAGNOSIS — Z3A19 19 weeks gestation of pregnancy: Secondary | ICD-10-CM

## 2018-07-16 DIAGNOSIS — M5430 Sciatica, unspecified side: Secondary | ICD-10-CM

## 2018-07-16 DIAGNOSIS — Z3482 Encounter for supervision of other normal pregnancy, second trimester: Secondary | ICD-10-CM

## 2018-07-16 HISTORY — DX: Sciatica, unspecified side: M54.30

## 2018-07-16 NOTE — Progress Notes (Signed)
TELEHEALTH OBSTETRICS PRENATAL VIRTUAL VIDEO VISIT ENCOUNTER NOTE  [facility]   I connected with Shirley Greene on 07/16/18 at 10:00 AM EDT by MyChart Video Encounter at home and verified that I am speaking with the correct person using two identifiers.   I discussed the limitations, risks, security and privacy concerns of performing an evaluation and management service by telephone and the availability of in person appointments. I also discussed with the patient that there may be a patient responsible charge related to this service. The patient expressed understanding and agreed to proceed. Subjective:  Shirley Greene is a 23 y.o. G2P1001 at 6221w1d being seen today for ongoing prenatal care.  She is currently monitored for the following issues for this low-risk pregnancy and has Cervical dysplasia; Supervision of other normal pregnancy, antepartum; History of cesarean delivery; and Alpha thalassemia silent carrier on their problem list.  Patient reports leg pain, in buttocks and down thigh.  Contractions: Not present. Vag. Bleeding: None.  Movement: Present. Denies any leaking of fluid.   The following portions of the patient's history were reviewed and updated as appropriate: allergies, current medications, past family history, past medical history, past social history, past surgical history and problem list.   Objective:   Vitals:   07/16/18 1013  BP: 103/67    Fetal Status:     Movement: Present     General:  Alert, oriented and cooperative. Patient is in no acute distress.  Respiratory: Normal respiratory effort, no problems with respiration noted  Mental Status: Normal mood and affect. Normal behavior. Normal judgment and thought content.  Rest of physical exam deferred due to type of encounter  Imaging: [place] Mfm Ob Detail +14 Wk  Result Date: 07/15/2018 ----------------------------------------------------------------------   OBSTETRICS REPORT                       (Signed Final 07/15/2018 12:09 pm) ---------------------------------------------------------------------- Patient Info  ID #:       [id number]                          D.O.B.:  1995/08/24 (22 yrs)  Name:       Shirley Greene                  Visit Date: 07/15/2018 11:04 am ---------------------------------------------------------------------- Performed By  Performed By:     Rennie PlowmanErica Lyskawa          Ref. Address:     [address]                    [address]                                                             [address]                                                             [place], [place]  [id number]  Attending:        Noralee Spaceavi Shankar MD        [facility]  Referred By:      [place] BingHARLIE                    PICKENS MD ---------------------------------------------------------------------- Orders   #  Description                          Code         Ordered By   1  US MFM OB DETAIL +14 WK              76811.01     CHARLIE PICKENS  ----------------------------------------------------------------------   #  Order #                    Accession #                 Episode #   1  [id number]                  [id number]                  [id number]  ---------------------------------------------------------------------- Indications   [redacted] weeks gestation of pregnancy                Z3A.19   Encounter for antenatal screening for          Z36.3   malformations (NIPS, low risk female)   Maternal thalassemia complicating              O99.012   pregnancy in second trimester (alpha carrier)   Previous cesarean delivery, antepartum         O34.219  ---------------------------------------------------------------------- Vital Signs  Weight (lb): 148                               Height:        5'0"  BMI:         28.9  ---------------------------------------------------------------------- Fetal Evaluation  Num Of Fetuses:         1  Fetal Heart Rate(bpm):  151  Cardiac Activity:       Observed  Presentation:           Cephalic  Placenta:               Posterior  P. Cord Insertion:      Visualized, central  Amniotic Fluid  AFI FV:      Within normal limits                              Largest Pocket(cm)                              3.2 ---------------------------------------------------------------------- Biometry  BPD:        43  mm  G. Age:  19w 0d         51  %    CI:        69.46   %    70 - 86                                                          FL/HC:      17.9   %    16.1 - 18.3  HC:      164.7  mm     G. Age:  19w 1d         52  %    HC/AC:      1.12        1.09 - 1.39  AC:      147.3  mm     G. Age:  20w 0d         78  %    FL/BPD:     68.6   %  FL:       29.5  mm     G. Age:  19w 1d         47  %    FL/AC:      20.0   %    20 - 24  HUM:      28.1  mm     G. Age:  19w 0d         51  %  CER:      19.7  mm     G. Age:  18w 6d         46  %  NFT:       4.3  mm  LV:        6.9  mm  CM:        4.9  mm  Est. FW:     298  gm    0 lb 11 oz      77  % ---------------------------------------------------------------------- OB History  Gravidity:    2         Term:   1        Prem:   0        SAB:   0  TOP:          0       Ectopic:  0        Living: 1 ---------------------------------------------------------------------- Gestational Age  LMP:           19w 0d        Date:  03/04/18                 EDD:   12/09/18  U/S Today:     19w 2d                                        EDD:   12/07/18  Best:          19w 0d     Det. By:  LMP  (03/04/18)          EDD:   12/09/18 ---------------------------------------------------------------------- Anatomy  Cranium:               Appears normal  LVOT:                   Not well visualized  Cavum:                 Appears normal         Aortic Arch:            Not well visualized   Ventricles:            Appears normal         Ductal Arch:            Appears normal  Choroid Plexus:        Appears normal         Diaphragm:              Appears normal  Cerebellum:            Appears normal         Stomach:                Appears normal, left                                                                        sided  Posterior Fossa:       Appears normal         Abdomen:                Appears normal  Nuchal Fold:           Appears normal         Abdominal Wall:         Appears nml (cord                                                                        insert, abd wall)  Face:                  Appears normal         Cord Vessels:           Appears normal (3                         (orbits and profile)                           vessel cord)  Lips:                  Appears normal         Kidneys:                Appear normal  Palate:                Appears normal         Bladder:                Appears normal  Thoracic:  Appears normal         Spine:                  Appears normal  Heart:                 Appears normal         Upper Extremities:      Appears normal                         (4CH, axis, and                         situs)  RVOT:                  Not well visualized    Lower Extremities:      Appears normal  Other:  Fetus appears to be a female. Nasal bone visualized. Heels and 5th          digit visualized. ---------------------------------------------------------------------- Cervix Uterus Adnexa  Cervix  Length:           3.64  cm.  Normal appearance by transabdominal scan.  Left Ovary  Within normal limits.  Right Ovary  Small corpus luteum noted.  Adnexa  No abnormality visualized. ---------------------------------------------------------------------- Impression  We performed fetal anatomy scan. No makers of  aneuploidies or fetal structural defects are seen. Fetal  biometry is consistent with her previously-established dates.  Amniotic fluid is normal and good  fetal activity is seen.  Patient understands the limitations of ultrasound in detecting  fetal anomalies.  On cell-free fetal DNA screening, the risks of fetal  aneuploidies are not increased.  Ms. Beasley is a silent carrier of alpha thalassemia. I  explained the genetics behind transmission. I encouraged her  to get her partner screened. If he is also a carrier of alpha  thalassemia (with one deletion), the fetus will be a carrier and  will not be affected. I also informed her that prenatal  diagnosis is possible by amniocentesis. ---------------------------------------------------------------------- Recommendations  An appointment was made for her to return in 4 weeks for  completion of fetal anatomy. ----------------------------------------------------------------------                  Tama High, MD Electronically Signed Final Report   07/15/2018 12:09 pm ----------------------------------------------------------------------   Assessment and Plan:  Pregnancy: G2P1001 at [redacted]w[redacted]d 1. Supervision of other normal pregnancy, antepartum F/u anatomy scheduled  2. Sciatica, unspecified laterality Exercises sent via MyChart May need some PT  General obstetric precautions including but not limited to vaginal bleeding, contractions, leaking of fluid and fetal movement were reviewed in detail with the patient. I discussed the assessment and treatment plan with the patient. The patient was provided an opportunity to ask questions and all were answered. The patient agreed with the plan and demonstrated an understanding of the instructions. The patient was advised to call back or seek an in-person office evaluation/go to [facility] at [facility] for any urgent or concerning symptoms. Please refer to After Visit Summary for other counseling recommendations.   I provided 12 minutes of face-to-face time during this encounter.  Return in 4 weeks (on 08/13/2018) for virtual, 8 wks in person, 28 wk labs.   Future Appointments  Date Time Provider [place]  08/12/2018  1:15 PM [place] 4 [facility]  08/13/2018  9:00 AM Caren Macadam, MD [facility]  09/10/2018  8:15 AM [place] Bing, MD [facility]    Reva Bores, MD [facility], [facility]

## 2018-07-16 NOTE — Progress Notes (Signed)
Having issues with leg pain, when she sits down she has a hard time getting up  I connected with  Fortune Tapp on 07/16/18 at 10:00 AM EDT by telephone and verified that I am speaking with the correct person using two identifiers.   I discussed the limitations, risks, security and privacy concerns of performing an evaluation and management service by telephone and the availability of in person appointments. I also discussed with the patient that there may be a patient responsible charge related to this service. The patient expressed understanding and agreed to proceed.  Crosby Oyster, RN 07/16/2018  10:15 AM

## 2018-07-16 NOTE — Patient Instructions (Addendum)
 Breastfeeding  Choosing to breastfeed is one of the best decisions you can make for yourself and your baby. A change in hormones during pregnancy causes your breasts to make breast milk in your milk-producing glands. Hormones prevent breast milk from being released before your baby is born. They also prompt milk flow after birth. Once breastfeeding has begun, thoughts of your baby, as well as his or her sucking or crying, can stimulate the release of milk from your milk-producing glands. Benefits of breastfeeding Research shows that breastfeeding offers many health benefits for infants and mothers. It also offers a cost-free and convenient way to feed your baby. For your baby  Your first milk (colostrum) helps your baby's digestive system to function better.  Special cells in your milk (antibodies) help your baby to fight off infections.  Breastfed babies are less likely to develop asthma, allergies, obesity, or type 2 diabetes. They are also at lower risk for sudden infant death syndrome (SIDS).  Nutrients in breast milk are better able to meet your baby's needs compared to infant formula.  Breast milk improves your baby's brain development. For you  Breastfeeding helps to create a very special bond between you and your baby.  Breastfeeding is convenient. Breast milk costs nothing and is always available at the correct temperature.  Breastfeeding helps to burn calories. It helps you to lose the weight that you gained during pregnancy.  Breastfeeding makes your uterus return faster to its size before pregnancy. It also slows bleeding (lochia) after you give birth.  Breastfeeding helps to lower your risk of developing type 2 diabetes, osteoporosis, rheumatoid arthritis, cardiovascular disease, and breast, ovarian, uterine, and endometrial cancer later in life. Breastfeeding basics Starting breastfeeding  Find a comfortable place to sit or lie down, with your neck and back  well-supported.  Place a pillow or a rolled-up blanket under your baby to bring him or her to the level of your breast (if you are seated). Nursing pillows are specially designed to help support your arms and your baby while you breastfeed.  Make sure that your baby's tummy (abdomen) is facing your abdomen.  Gently massage your breast. With your fingertips, massage from the outer edges of your breast inward toward the nipple. This encourages milk flow. If your milk flows slowly, you may need to continue this action during the feeding.  Support your breast with 4 fingers underneath and your thumb above your nipple (make the letter "C" with your hand). Make sure your fingers are well away from your nipple and your baby's mouth.  Stroke your baby's lips gently with your finger or nipple.  When your baby's mouth is open wide enough, quickly bring your baby to your breast, placing your entire nipple and as much of the areola as possible into your baby's mouth. The areola is the colored area around your nipple. ? More areola should be visible above your baby's upper lip than below the lower lip. ? Your baby's lips should be opened and extended outward (flanged) to ensure an adequate, comfortable latch. ? Your baby's tongue should be between his or her lower gum and your breast.  Make sure that your baby's mouth is correctly positioned around your nipple (latched). Your baby's lips should create a seal on your breast and be turned out (everted).  It is common for your baby to suck about 2-3 minutes in order to start the flow of breast milk. Latching Teaching your baby how to latch onto your breast properly   is very important. An improper latch can cause nipple pain, decreased milk supply, and poor weight gain in your baby. Also, if your baby is not latched onto your nipple properly, he or she may swallow some air during feeding. This can make your baby fussy. Burping your baby when you switch breasts  during the feeding can help to get rid of the air. However, teaching your baby to latch on properly is still the best way to prevent fussiness from swallowing air while breastfeeding. Signs that your baby has successfully latched onto your nipple  Silent tugging or silent sucking, without causing you pain. Infant's lips should be extended outward (flanged).  Swallowing heard between every 3-4 sucks once your milk has started to flow (after your let-down milk reflex occurs).  Muscle movement above and in front of his or her ears while sucking. Signs that your baby has not successfully latched onto your nipple  Sucking sounds or smacking sounds from your baby while breastfeeding.  Nipple pain. If you think your baby has not latched on correctly, slip your finger into the corner of your baby's mouth to break the suction and place it between your baby's gums. Attempt to start breastfeeding again. Signs of successful breastfeeding Signs from your baby  Your baby will gradually decrease the number of sucks or will completely stop sucking.  Your baby will fall asleep.  Your baby's body will relax.  Your baby will retain a small amount of milk in his or her mouth.  Your baby will let go of your breast by himself or herself. Signs from you  Breasts that have increased in firmness, weight, and size 1-3 hours after feeding.  Breasts that are softer immediately after breastfeeding.  Increased milk volume, as well as a change in milk consistency and color by the fifth day of breastfeeding.  Nipples that are not sore, cracked, or bleeding. Signs that your baby is getting enough milk  Wetting at least 1-2 diapers during the first 24 hours after birth.  Wetting at least 5-6 diapers every 24 hours for the first week after birth. The urine should be clear or pale yellow by the age of 5 days.  Wetting 6-8 diapers every 24 hours as your baby continues to grow and develop.  At least 3 stools in  a 24-hour period by the age of 5 days. The stool should be soft and yellow.  At least 3 stools in a 24-hour period by the age of 7 days. The stool should be seedy and yellow.  No loss of weight greater than 10% of birth weight during the first 3 days of life.  Average weight gain of 4-7 oz (113-198 g) per week after the age of 4 days.  Consistent daily weight gain by the age of 5 days, without weight loss after the age of 2 weeks. After a feeding, your baby may spit up a small amount of milk. This is normal. Breastfeeding frequency and duration Frequent feeding will help you make more milk and can prevent sore nipples and extremely full breasts (breast engorgement). Breastfeed when you feel the need to reduce the fullness of your breasts or when your baby shows signs of hunger. This is called "breastfeeding on demand." Signs that your baby is hungry include:  Increased alertness, activity, or restlessness.  Movement of the head from side to side.  Opening of the mouth when the corner of the mouth or cheek is stroked (rooting).  Increased sucking sounds, smacking lips,   cooing, sighing, or squeaking.  Hand-to-mouth movements and sucking on fingers or hands.  Fussing or crying. Avoid introducing a pacifier to your baby in the first 4-6 weeks after your baby is born. After this time, you may choose to use a pacifier. Research has shown that pacifier use during the first year of a baby's life decreases the risk of sudden infant death syndrome (SIDS). Allow your baby to feed on each breast as long as he or she wants. When your baby unlatches or falls asleep while feeding from the first breast, offer the second breast. Because newborns are often sleepy in the first few weeks of life, you may need to awaken your baby to get him or her to feed. Breastfeeding times will vary from baby to baby. However, the following rules can serve as a guide to help you make sure that your baby is properly fed:   Newborns (babies 4 weeks of age or younger) may breastfeed every 1-3 hours.  Newborns should not go without breastfeeding for longer than 3 hours during the day or 5 hours during the night.  You should breastfeed your baby a minimum of 8 times in a 24-hour period. Breast milk pumping     Pumping and storing breast milk allows you to make sure that your baby is exclusively fed your breast milk, even at times when you are unable to breastfeed. This is especially important if you go back to work while you are still breastfeeding, or if you are not able to be present during feedings. Your lactation consultant can help you find a method of pumping that works best for you and give you guidelines about how long it is safe to store breast milk. Caring for your breasts while you breastfeed Nipples can become dry, cracked, and sore while breastfeeding. The following recommendations can help keep your breasts moisturized and healthy:  Avoid using soap on your nipples.  Wear a supportive bra designed especially for nursing. Avoid wearing underwire-style bras or extremely tight bras (sports bras).  Air-dry your nipples for 3-4 minutes after each feeding.  Use only cotton bra pads to absorb leaked breast milk. Leaking of breast milk between feedings is normal.  Use lanolin on your nipples after breastfeeding. Lanolin helps to maintain your skin's normal moisture barrier. Pure lanolin is not harmful (not toxic) to your baby. You may also hand express a few drops of breast milk and gently massage that milk into your nipples and allow the milk to air-dry. In the first few weeks after giving birth, some women experience breast engorgement. Engorgement can make your breasts feel heavy, warm, and tender to the touch. Engorgement peaks within 3-5 days after you give birth. The following recommendations can help to ease engorgement:  Completely empty your breasts while breastfeeding or pumping. You may want to  start by applying warm, moist heat (in the shower or with warm, water-soaked hand towels) just before feeding or pumping. This increases circulation and helps the milk flow. If your baby does not completely empty your breasts while breastfeeding, pump any extra milk after he or she is finished.  Apply ice packs to your breasts immediately after breastfeeding or pumping, unless this is too uncomfortable for you. To do this: ? Put ice in a plastic bag. ? Place a towel between your skin and the bag. ? Leave the ice on for 20 minutes, 2-3 times a day.  Make sure that your baby is latched on and positioned properly while breastfeeding.   If engorgement persists after 48 hours of following these recommendations, contact your health care provider or a Science writer. Overall health care recommendations while breastfeeding  Eat 3 healthy meals and 3 snacks every day. Well-nourished mothers who are breastfeeding need an additional 450-500 calories a day. You can meet this requirement by increasing the amount of a balanced diet that you eat.  Drink enough water to keep your urine pale yellow or clear.  Rest often, relax, and continue to take your prenatal vitamins to prevent fatigue, stress, and low vitamin and mineral levels in your body (nutrient deficiencies).  Do not use any products that contain nicotine or tobacco, such as cigarettes and e-cigarettes. Your baby may be harmed by chemicals from cigarettes that pass into breast milk and exposure to secondhand smoke. If you need help quitting, ask your health care provider.  Avoid alcohol.  Do not use illegal drugs or marijuana.  Talk with your health care provider before taking any medicines. These include over-the-counter and prescription medicines as well as vitamins and herbal supplements. Some medicines that may be harmful to your baby can pass through breast milk.  It is possible to become pregnant while breastfeeding. If birth control is  desired, ask your health care provider about options that will be safe while breastfeeding your baby. Where to find more information: [employer]: www.llli.org Contact a health care provider if:  You feel like you want to stop breastfeeding or have become frustrated with breastfeeding.  Your nipples are cracked or bleeding.  Your breasts are red, tender, or warm.  You have: ? Painful breasts or nipples. ? A swollen area on either breast. ? A fever or chills. ? Nausea or vomiting. ? Drainage other than breast milk from your nipples.  Your breasts do not become full before feedings by the fifth day after you give birth.  You feel sad and depressed.  Your baby is: ? Too sleepy to eat well. ? Having trouble sleeping. ? More than 14 week old and wetting fewer than 6 diapers in a 24-hour period. ? Not gaining weight by 97 days of age.  Your baby has fewer than 3 stools in a 24-hour period.  Your baby's skin or the white parts of his or her eyes become yellow. Get help right away if:  Your baby is overly tired (lethargic) and does not want to wake up and feed.  Your baby develops an unexplained fever. Summary  Breastfeeding offers many health benefits for infant and mothers.  Try to breastfeed your infant when he or she shows early signs of hunger.  Gently tickle or stroke your baby's lips with your finger or nipple to allow the baby to open his or her mouth. Bring the baby to your breast. Make sure that much of the areola is in your baby's mouth. Offer one side and burp the baby before you offer the other side.  Talk with your health care provider or lactation consultant if you have questions or you face problems as you breastfeed. This information is not intended to replace advice given to you by your health care provider. Make sure you discuss any questions you have with your health care provider. Document Released: 01/02/2005 Document Revised: 03/29/2017  Document Reviewed: 02/04/2016 Elsevier Patient Education  2020 [place].  Sciatica  Sciatica is pain, numbness, weakness, or tingling along the path of the sciatic nerve. The sciatic nerve starts in the lower back and runs down the back of each  leg. The nerve controls the muscles in the lower leg and in the back of the knee. It also provides feeling (sensation) to the back of the thigh, the lower leg, and the sole of the foot. Sciatica is a symptom of another medical condition that pinches or puts pressure on the sciatic nerve. Sciatica most often only affects one side of the body. Sciatica usually goes away on its own or with treatment. In some cases, sciatica may come back (recur). What are the causes? This condition is caused by pressure on the sciatic nerve or pinching of the nerve. This may be the result of:  A disk in between the bones of the spine bulging out too far (herniated disk).  Age-related changes in the spinal disks.  A pain disorder that affects a muscle in the buttock.  Extra bone growth near the sciatic nerve.  A break (fracture) of the pelvis.  Pregnancy.  Tumor. This is rare. What increases the risk? The following factors may make you more likely to develop this condition:  Playing sports that place pressure or stress on the spine.  Having poor strength and flexibility.  A history of back injury or surgery.  Sitting for long periods of time.  Doing activities that involve repetitive bending or lifting.  Obesity. What are the signs or symptoms? Symptoms can vary from mild to very severe, and they may include:  Any of these problems in the lower back, leg, hip, or buttock: ? Mild tingling, numbness, or dull aches. ? Burning sensations. ? Sharp pains.  Numbness in the back of the calf or the sole of the foot.  Leg weakness.  Severe back pain that makes movement difficult. Symptoms may get worse when you cough, sneeze, or laugh, or when you sit  or stand for long periods of time. How is this diagnosed? This condition may be diagnosed based on:  Your symptoms and medical history.  A physical exam.  Blood tests.  Imaging tests, such as: ? X-rays. ? MRI. ? CT scan. How is this treated? In many cases, this condition improves on its own without treatment. However, treatment may include:  Reducing or modifying physical activity.  Exercising and stretching.  Icing and applying heat to the affected area.  Medicines that help to: ? Relieve pain and swelling. ? Relax your muscles.  Injections of medicines that help to relieve pain, irritation, and inflammation around the sciatic nerve (steroids).  Surgery. Follow these instructions at home: Medicines  Take over-the-counter and prescription medicines only as told by your health care provider.  Ask your health care provider if the medicine prescribed to you: ? Requires you to avoid driving or using heavy machinery. ? Can cause constipation. You may need to take these actions to prevent or treat constipation:  Drink enough fluid to keep your urine pale yellow.  Take over-the-counter or prescription medicines.  Eat foods that are high in fiber, such as beans, whole grains, and fresh fruits and vegetables.  Limit foods that are high in fat and processed sugars, such as fried or sweet foods. Managing pain      If directed, put ice on the affected area. ? Put ice in a plastic bag. ? Place a towel between your skin and the bag. ? Leave the ice on for 20 minutes, 2-3 times a day.  If directed, apply heat to the affected area. Use the heat source that your health care provider recommends, such as a moist heat pack or a  heating pad. ? Place a towel between your skin and the heat source. ? Leave the heat on for 20-30 minutes. ? Remove the heat if your skin turns bright red. This is especially important if you are unable to feel pain, heat, or cold. You may have a greater  risk of getting burned. Activity   Return to your normal activities as told by your health care provider. Ask your health care provider what activities are safe for you.  Avoid activities that make your symptoms worse.  Take brief periods of rest throughout the day. ? When you rest for longer periods, mix in some mild activity or stretching between periods of rest. This will help to prevent stiffness and pain. ? Avoid sitting for long periods of time without moving. Get up and move around at least one time each hour.  Exercise and stretch regularly, as told by your health care provider.  Do not lift anything that is heavier than 10 lb (4.5 kg) while you have symptoms of sciatica. When you do not have symptoms, you should still avoid heavy lifting, especially repetitive heavy lifting.  When you lift objects, always use proper lifting technique, which includes: ? Bending your knees. ? Keeping the load close to your body. ? Avoiding twisting. General instructions  Maintain a healthy weight. Excess weight puts extra stress on your back.  Wear supportive, comfortable shoes. Avoid wearing high heels.  Avoid sleeping on a mattress that is too soft or too hard. A mattress that is firm enough to support your back when you sleep may help to reduce your pain.  Keep all follow-up visits as told by your health care provider. This is important. Contact a health care provider if:  You have pain that: ? Wakes you up when you are sleeping. ? Gets worse when you lie down. ? Is worse than you have experienced in the past. ? Lasts longer than 4 weeks.  You have an unexplained weight loss. Get help right away if:  You are not able to control when you urinate or have bowel movements (incontinence).  You have: ? Weakness in your lower back, pelvis, buttocks, or legs that gets worse. ? Redness or swelling of your back. ? A burning sensation when you urinate. Summary  Sciatica is pain,  numbness, weakness, or tingling along the path of the sciatic nerve.  This condition is caused by pressure on the sciatic nerve or pinching of the nerve.  Sciatica can cause pain, numbness, or tingling in the lower back, legs, hips, and buttocks.  Treatment often includes rest, exercise, medicines, and applying ice or heat. This information is not intended to replace advice given to you by your health care provider. Make sure you discuss any questions you have with your health care provider. Document Released: 12/27/2000 Document Revised: 01/21/2018 Document Reviewed: 01/21/2018 Elsevier Patient Education  2020 [employer] Inc[employer]  

## 2018-07-31 ENCOUNTER — Ambulatory Visit (INDEPENDENT_AMBULATORY_CARE_PROVIDER_SITE_OTHER): Payer: Medicaid Other | Admitting: Advanced Practice Midwife

## 2018-07-31 ENCOUNTER — Other Ambulatory Visit: Payer: Self-pay

## 2018-07-31 VITALS — BP 108/70 | HR 114 | Wt 153.0 lb

## 2018-07-31 DIAGNOSIS — O26892 Other specified pregnancy related conditions, second trimester: Secondary | ICD-10-CM

## 2018-07-31 DIAGNOSIS — Z3A21 21 weeks gestation of pregnancy: Secondary | ICD-10-CM

## 2018-07-31 DIAGNOSIS — Z98891 History of uterine scar from previous surgery: Secondary | ICD-10-CM

## 2018-07-31 DIAGNOSIS — M5430 Sciatica, unspecified side: Secondary | ICD-10-CM

## 2018-07-31 DIAGNOSIS — Z3480 Encounter for supervision of other normal pregnancy, unspecified trimester: Secondary | ICD-10-CM

## 2018-07-31 NOTE — Progress Notes (Addendum)
   PRENATAL VISIT NOTE  Subjective:  Shirley Greene is a 23 y.o. G2P1001 at [redacted]w[redacted]d being seen today for ongoing prenatal care.  She is currently monitored for the following issues for this low-risk pregnancy and has Cervical dysplasia; Supervision of other normal pregnancy, antepartum; History of cesarean delivery; Alpha thalassemia silent carrier; and Sciatica on their problem list.  Patient reports sciatic nerve pain. This is a recurring problem, onset mid-June.  Patient's pain is typically on her left side but is occasionally bilateral, aggravated by position changes. Previously discussed Physical Therapy referral and would like to pursue.  Contractions: Not present. Vag. Bleeding: None.  Movement: Present. Denies leaking of fluid.   Patient desires repeat cesarean section and tubal ligation.    The following portions of the patient's history were reviewed and updated as appropriate: allergies, current medications, past family history, past medical history, past social history, past surgical history and problem list.   Objective:   Vitals:   07/31/18 1412  BP: 108/70  Pulse: (!) 114  Weight: 153 lb (69.4 kg)    Fetal Status: Fetal Heart Rate (bpm): 159   Movement: Present     General:  Alert, oriented and cooperative. Patient is in no acute distress.  Skin: Skin is warm and dry. No rash noted.   Cardiovascular: Normal heart rate noted  Respiratory: Normal respiratory effort, no problems with respiration noted  Abdomen: Soft, gravid, appropriate for gestational age.  Pain/Pressure: Absent     Pelvic: Cervical exam deferred        Extremities: Normal range of motion.  Edema: None  Mental Status: Normal mood and affect. Normal behavior. Normal judgment and thought content.   Assessment and Plan:  Pregnancy: G2P1001 at [redacted]w[redacted]d 1. Supervision of other normal pregnancy, antepartum - Continue routine care  2. Sciatica, unspecified laterality - Continue previously prescribed exercises.  Consider passive floating in pool  - Expectation management regarding possible increase in frequency of pain as baby grows - Ambulatory referral to Physical Therapy  3. Hx of cesarean section - Desires repeat cesarean and BTL - Discussed typical timing of cesarean section at 16 weeks - For MD discussion and scheduling at 28 weeks   Preterm labor symptoms and general obstetric precautions including but not limited to vaginal bleeding, contractions, leaking of fluid and fetal movement were reviewed in detail with the patient. Please refer to After Visit Summary for other counseling recommendations.   Future Appointments  Date Time Provider Weed  08/13/2018  9:00 AM Caren Macadam, MD [facility]  08/16/2018  8:15 AM [facility] [place] 4 [facility]  09/10/2018  8:15 AM Aletha Halim, MD [facility]   Mallie Snooks, MSN, CNM Certified Nurse Midwife, [facility] for [employer], [facility] 07/31/18 2:35 PM

## 2018-07-31 NOTE — Patient Instructions (Signed)

## 2018-08-12 ENCOUNTER — Ambulatory Visit (HOSPITAL_COMMUNITY): Payer: Medicaid Other

## 2018-08-13 ENCOUNTER — Telehealth (INDEPENDENT_AMBULATORY_CARE_PROVIDER_SITE_OTHER): Payer: Medicaid Other | Admitting: Family Medicine

## 2018-08-13 ENCOUNTER — Other Ambulatory Visit: Payer: Self-pay

## 2018-08-13 VITALS — BP 100/65 | Wt 152.0 lb

## 2018-08-13 DIAGNOSIS — O99830 Other infection carrier state complicating pregnancy: Secondary | ICD-10-CM

## 2018-08-13 DIAGNOSIS — Z98891 History of uterine scar from previous surgery: Secondary | ICD-10-CM | POA: Diagnosis not present

## 2018-08-13 DIAGNOSIS — Z3A23 23 weeks gestation of pregnancy: Secondary | ICD-10-CM

## 2018-08-13 DIAGNOSIS — D563 Thalassemia minor: Secondary | ICD-10-CM

## 2018-08-13 DIAGNOSIS — Z3480 Encounter for supervision of other normal pregnancy, unspecified trimester: Secondary | ICD-10-CM

## 2018-08-13 NOTE — Progress Notes (Signed)
   TELEHEAL VIRTUAL OBSTETRICS VISIT ENCOUNTER NOTE  I connected with Shirley Greene on 08/13/18 at  9:00 AM EDT by telephone at home and verified that I am speaking with the correct person using two identifiers.   I discussed the limitations, risks, security and privacy concerns of performing an evaluation and management service by telephone and the availability of in person appointments. I also discussed with the patient that there may be a patient responsible charge related to this service. The patient expressed understanding and agreed to proceed.  Subjective:  Shirley Greene is a 23 y.o. G2P1001 at [redacted]w[redacted]d being followed for ongoing prenatal care.  She is currently monitored for the following issues for this low-risk pregnancy and has Cervical dysplasia; Supervision of other normal pregnancy, antepartum; History of cesarean delivery; Alpha thalassemia silent carrier; and Sciatica on their problem list.  Patient reports dizziness and weakness when sitting- started many weeks ago. Started taking extra iron which did not help. Drinks about 4cups per day. Reports fetal movement. Denies any contractions, bleeding or leaking of fluid.   The following portions of the patient's history were reviewed and updated as appropriate: allergies, current medications, past family history, past medical history, past social history, past surgical history and problem list.   Objective:   General:  Alert, oriented and cooperative.   Mental Status: Normal mood and affect perceived. Normal judgment and thought content.  Rest of physical exam deferred due to type of encounter  Assessment and Plan:  Pregnancy: G2P1001 at [redacted]w[redacted]d  1. Supervision of other normal pregnancy, antepartum UTD  Lots of back pain- still has not heard from physical therapy   2. History of cesarean delivery - Desires repeat with BTL - Biscuss further at next visit at 28 wks  3. Alpha thalassemia silent carrier UCx next visit    Preterm labor symptoms and general obstetric precautions including but not limited to vaginal bleeding, contractions, leaking of fluid and fetal movement were reviewed in detail with the patient.   I discussed the assessment and treatment plan with the patient. The patient was provided an opportunity to ask questions and all were answered. The patient agreed with the plan and demonstrated an understanding of the instructions. The patient was advised to call back or seek an in-person office evaluation/go to MAU at [facility] for any urgent or concerning symptoms. Please refer to After Visit Summary for other counseling recommendations.   I provided 15 minutes of non-face-to-face time during this encounter.  No follow-ups on file.  Future Appointments  Date Time Provider [place]  08/16/2018  8:15 AM [facility] [place] 4 [facility]  09/10/2018  8:15 AM Aletha Halim, MD [facility]    Caren Macadam, [place] for [facility], [place]

## 2018-08-13 NOTE — Patient Instructions (Signed)
   Spinning Babies for stretches.

## 2018-08-13 NOTE — Progress Notes (Signed)
I connected with  Shirley Greene on 08/13/18 at  9:00 AM EDT by telephone and verified that I am speaking with the correct person using two identifiers.   I discussed the limitations, risks, security and privacy concerns of performing an evaluation and management service by telephone and the availability of in person appointments. I also discussed with the patient that there may be a patient responsible charge related to this service. The patient expressed understanding and agreed to proceed.  Crosby Oyster, RN 08/13/2018  9:02 AM

## 2018-08-16 ENCOUNTER — Ambulatory Visit (HOSPITAL_COMMUNITY)
Admission: RE | Admit: 2018-08-16 | Discharge: 2018-08-16 | Disposition: A | Discharge status: Home / Self Care | Payer: Medicaid Other | Source: Ambulatory Visit | Attending: Obstetrics and Gynecology | Admitting: Obstetrics and Gynecology

## 2018-08-16 ENCOUNTER — Other Ambulatory Visit: Payer: Self-pay

## 2018-08-16 DIAGNOSIS — O34219 Maternal care for unspecified type scar from previous cesarean delivery: Secondary | ICD-10-CM

## 2018-08-16 DIAGNOSIS — Z362 Encounter for other antenatal screening follow-up: Secondary | ICD-10-CM | POA: Diagnosis not present

## 2018-08-16 DIAGNOSIS — O99012 Anemia complicating pregnancy, second trimester: Secondary | ICD-10-CM

## 2018-08-16 DIAGNOSIS — Z3A23 23 weeks gestation of pregnancy: Secondary | ICD-10-CM | POA: Diagnosis not present

## 2018-08-23 ENCOUNTER — Ambulatory Visit: Payer: Medicaid Other | Admitting: Physical Therapy

## 2018-08-29 ENCOUNTER — Telehealth: Payer: Self-pay | Admitting: *Deleted

## 2018-08-29 ENCOUNTER — Other Ambulatory Visit: Payer: Self-pay

## 2018-08-29 ENCOUNTER — Inpatient Hospital Stay (HOSPITAL_COMMUNITY)
Admission: AD | Admit: 2018-08-29 | Discharge: 2018-08-29 | Disposition: A | Discharge status: Home / Self Care | Payer: Medicaid Other | Attending: Obstetrics and Gynecology | Admitting: Obstetrics and Gynecology

## 2018-08-29 ENCOUNTER — Encounter (HOSPITAL_COMMUNITY): Payer: Self-pay

## 2018-08-29 DIAGNOSIS — Z3A25 25 weeks gestation of pregnancy: Secondary | ICD-10-CM | POA: Diagnosis not present

## 2018-08-29 DIAGNOSIS — O4702 False labor before 37 completed weeks of gestation, second trimester: Secondary | ICD-10-CM | POA: Diagnosis not present

## 2018-08-29 DIAGNOSIS — O4700 False labor before 37 completed weeks of gestation, unspecified trimester: Secondary | ICD-10-CM

## 2018-08-29 DIAGNOSIS — R8271 Bacteriuria: Secondary | ICD-10-CM

## 2018-08-29 DIAGNOSIS — R109 Unspecified abdominal pain: Secondary | ICD-10-CM | POA: Diagnosis present

## 2018-08-29 DIAGNOSIS — O479 False labor, unspecified: Secondary | ICD-10-CM

## 2018-08-29 LAB — URINALYSIS, ROUTINE W REFLEX MICROSCOPIC
Bilirubin Urine: NEGATIVE
Glucose, UA: NEGATIVE mg/dL
Hgb urine dipstick: NEGATIVE
Ketones, ur: NEGATIVE mg/dL
Nitrite: NEGATIVE
Protein, ur: 30 mg/dL — AB
Specific Gravity, Urine: 1.021 (ref 1.005–1.030)
pH: 6 (ref 5.0–8.0)

## 2018-08-29 LAB — WET PREP, GENITAL
Clue Cells Wet Prep HPF POC: NONE SEEN
Sperm: NONE SEEN
Trich, Wet Prep: NONE SEEN
Yeast Wet Prep HPF POC: NONE SEEN

## 2018-08-29 LAB — FETAL FIBRONECTIN: Fetal Fibronectin: NEGATIVE

## 2018-08-29 MED ORDER — CEPHALEXIN 500 MG PO CAPS: 500.0000 mg | ORAL_CAPSULE | Freq: Four times a day (QID) | ORAL | 0 refills | Status: DC

## 2018-08-29 MED ORDER — NIFEDIPINE 10 MG PO CAPS
10.0000 mg | ORAL_CAPSULE | ORAL | Status: DC | PRN
  Administered 2018-08-29: 10 mg via ORAL
  Filled 2018-08-29: qty 1

## 2018-08-29 NOTE — MAU Note (Signed)
Reports having contractions intermittently throughout the day, maybe 1-2 per hour.  Also reports feeling a lot of vaginal pressure. No complications w/ this pregnancy or in her last.  States no VB/LOF.  + FM.  Reports she feels like she thinks she could be getting a yeast infection.

## 2018-08-29 NOTE — Discharge Instructions (Signed)
Preterm Labor and Birth Information Pregnancy normally lasts 39-41 weeks. Preterm labor is when labor starts early. It starts before you have been pregnant for 37 whole weeks. What are the risk factors for preterm labor? Preterm labor is more likely to occur in women who:  Have an infection while pregnant.  Have a cervix that is short.  Have gone into preterm labor before.  Have had surgery on their cervix.  Are younger than age 25.  Are older than age 91.  Are African American.  Are pregnant with two or more babies.  Take street drugs while pregnant.  Smoke while pregnant.  Do not gain enough weight while pregnant.  Got pregnant right after another pregnancy. What are the symptoms of preterm labor? Symptoms of preterm labor include:  Cramps. The cramps may feel like the cramps some women get during their period. The cramps may happen with watery poop (diarrhea).  Pain in the belly (abdomen).  Pain in the lower back.  Regular contractions or tightening. It may feel like your belly is getting tighter.  Pressure in the lower belly that seems to get stronger.  More fluid (discharge) leaking from the vagina. The fluid may be watery or bloody.  Water breaking. Why is it important to notice signs of preterm labor? Babies who are born early may not be fully developed. They have a higher chance for:  Long-term heart problems.  Long-term lung problems.  Trouble controlling body systems, like breathing.  Bleeding in the brain.  A condition called cerebral palsy.  Learning difficulties.  Death. These risks are highest for babies who are born before 46 weeks of pregnancy. How is preterm labor treated? Treatment depends on:  How long you were pregnant.  Your condition.  The health of your baby. Treatment may involve:  Having a stitch (suture) placed in your cervix. When you give birth, your cervix opens so the baby can come out. The stitch keeps the cervix  from opening too soon.  Staying at the hospital.  Taking or getting medicines, such as: ? Hormone medicines. ? Medicines to stop contractions. ? Medicines to help the babys lungs develop. ? Medicines to prevent your baby from having cerebral palsy. What should I do if I am in preterm labor? If you think you are going into labor too soon, call your doctor right away. How can I prevent preterm labor?  Do not use any tobacco products. ? Examples of these are cigarettes, chewing tobacco, and e-cigarettes. ? If you need help quitting, ask your doctor.  Do not use street drugs.  Do not use any medicines unless you ask your doctor if they are safe for you.  Talk with your doctor before taking any herbal supplements.  Make sure you gain enough weight.  Watch for infection. If you think you might have an infection, get it checked right away.  If you have gone into preterm labor before, tell your doctor. This information is not intended to replace advice given to you by your health care provider. Make sure you discuss any questions you have with your health care provider. Document Released: 03/31/2008 Document Revised: 04/26/2018 Document Reviewed: 05/26/2015 Elsevier Patient Education  2020 [employer].  Pregnancy and Urinary Tract Infection  A urinary tract infection (UTI) is an infection of any part of the urinary tract. This includes the kidneys, the tubes that connect your kidneys to your bladder (ureters), the bladder, and the tube that carries urine out of your body (urethra). These  organs make, store, and get rid of urine in the body. Your health care provider may use other names to describe the infection. An upper UTI affects the ureters and kidneys (pyelonephritis). A lower UTI affects the bladder (cystitis) and urethra (urethritis). Most urinary tract infections are caused by bacteria in your genital area, around the entrance to your urinary tract (urethra). These bacteria grow  and cause irritation and inflammation of your urinary tract. You are more likely to develop a UTI during pregnancy because the physical and hormonal changes your body goes through can make it easier for bacteria to get into your urinary tract. Your growing baby also puts pressure on your bladder and can affect urine flow. It is important to recognize and treat UTIs in pregnancy because of the risk of serious complications for both you and your baby. How does this affect me? Symptoms of a UTI include:  Needing to urinate right away (urgently).  Frequent urination or passing small amounts of urine frequently.  Pain or burning with urination.  Blood in the urine.  Urine that smells bad or unusual.  Trouble urinating.  Cloudy urine.  Pain in the abdomen or lower back.  Vaginal discharge. You may also have:  Vomiting or a decreased appetite.  Confusion.  Irritability or tiredness.  A fever.  Diarrhea. How does this affect my baby? An untreated UTI during pregnancy could lead to a kidney infection or a systemic infection, which can cause health problems that could affect your baby. Possible complications of an untreated UTI include:  Giving birth to your baby before 37 weeks of pregnancy (premature).  Having a baby with a low birth weight.  Developing high blood pressure during pregnancy (preeclampsia).  Having a low hemoglobin level (anemia). What can I do to lower my risk? To prevent a UTI:  Go to the bathroom as soon as you feel the need. Do not hold urine for long periods of time.  Always wipe from front to back, especially after a bowel movement. Use each tissue one time when you wipe.  Empty your bladder after sex.  Keep your genital area dry.  Drink 6-10 glasses of water each day.  Do not douche or use deodorant sprays. How is this treated? Treatment for this condition may include:  Antibiotic medicines that are safe to take during pregnancy.  Other  medicines to treat less common causes of UTI. Follow these instructions at home:  If you were prescribed an antibiotic medicine, take it as told by your health care provider. Do not stop using the antibiotic even if you start to feel better.  Keep all follow-up visits as told by your health care provider. This is important. Contact a health care provider if:  Your symptoms do not improve or they get worse.  You have abnormal vaginal discharge. Get help right away if you:  Have a fever.  Have nausea and vomiting.  Have back or side pain.  Feel contractions in your uterus.  Have lower belly pain.  Have a gush of fluid from your vagina.  Have blood in your urine. Summary  A urinary tract infection (UTI) is an infection of any part of the urinary tract, which includes the kidneys, ureters, bladder, and urethra.  Most urinary tract infections are caused by bacteria in your genital area, around the entrance to your urinary tract (urethra).  You are more likely to develop a UTI during pregnancy.  If you were prescribed an antibiotic medicine, take it as  told by your health care provider. Do not stop using the antibiotic even if you start to feel better. This information is not intended to replace advice given to you by your health care provider. Make sure you discuss any questions you have with your health care provider. Document Released: 04/29/2010 Document Revised: 04/26/2018 Document Reviewed: 12/06/2017 Elsevier Patient Education  2020 [employer].

## 2018-08-29 NOTE — Telephone Encounter (Signed)
Called pt back in regards to message she left about cramping. LM for her to call us back

## 2018-08-29 NOTE — Telephone Encounter (Signed)
Pt states she is having cramping and pressure. Denies any LOF or vaginal bleeding. Good fetal movement. Informed pt increase her fluids and rest and if she is still uncomfortable to go to [facility] for evaluation as its the weekend and we have no provider here on Fridays. Pt verbalizes.

## 2018-08-29 NOTE — MAU Provider Note (Signed)
Chief Complaint:  Abdominal Pain   First Provider Initiated Contact with Patient 08/29/18 2117      HPI: Shirley Greene is a 23 y.o. G2P1001 at 7225w3dwho presents to maternity admissions reporting uterine contractions all day which were more frequent tonight.  Pelvic pressure.   Some vaginal discomfort. . She reports good fetal movement, denies LOF, vaginal bleeding, vaginal itching/burning, h/a, dizziness, n/v, diarrhea, constipation or fever/chills.    RN Note: Reports having contractions intermittently throughout the day, maybe 1-2 per hour.  Also reports feeling a lot of vaginal pressure. No complications w/ this pregnancy or in her last.  States no VB/LOF.  + FM.  Reports she feels like she thinks she could be getting a yeast infection.   Past Medical History: Past Medical History:  Diagnosis Date  . Anemia   . Chlamydia   . Infection    UTI    Past obstetric history: OB History  Gravida Para Term Preterm AB Living  2 1 1     1   SAB TAB Ectopic Multiple Live Births          1    # Outcome Date GA Lbr Len/2nd Weight Sex Delivery Anes PTL Lv  2 Current           1 Term 04/05/12 1524w1d  3595 g M CS-LTranv EPI  LIV    Obstetric Comments  "OP" presentation, got stuck at 9cm    Past Surgical History: Past Surgical History:  Procedure Laterality Date  . CESAREAN SECTION N/A 04/05/2012   Procedure: CESAREAN SECTION;  Surgeon: Tilda BurrowJohn V Ferguson, MD;  Location: [facility];  Service: Obstetrics;  Laterality: N/A;    Family History: Family History  Problem Relation Age of Onset  . Hypertension Mother   . Hypertension Father   . Cancer Maternal Grandfather     Social History: Social History   Tobacco Use  . Smoking status: Never Smoker  . Smokeless tobacco: Never Used  Substance Use Topics  . Alcohol use: Not Currently    Comment: twice a month  . Drug use: No    Allergies: No Known Allergies  Meds:  Medications Prior to Admission  Medication Sig Dispense Refill Last  Dose  . ferrous sulfate 325 (65 FE) MG EC tablet Take 325 mg by mouth 3 (three) times daily with meals.   08/28/2018 at Unknown time  . Prenatal Vit-Fe Fumarate-FA (PRENATAL MULTIVITAMIN) TABS tablet Take 1 tablet by mouth daily at 12 noon.   08/28/2018 at Unknown time  . acetaminophen (TYLENOL) 500 MG tablet Take 1 tablet (500 mg total) by mouth every 6 (six) hours as needed. (Patient not taking: Reported on 07/31/2018) 30 tablet 0   . Cetirizine HCl 10 MG CAPS Take 1 capsule (10 mg total) by mouth daily for 10 days. 15 capsule 0   . nitrofurantoin, macrocrystal-monohydrate, (MACROBID) 100 MG capsule Take 1 capsule (100 mg total) by mouth 2 (two) times daily. (Patient not taking: Reported on 07/15/2018) 14 capsule 0   . phenazopyridine (PYRIDIUM) 200 MG tablet Take 1 tablet (200 mg total) by mouth 3 (three) times daily as needed for pain (urethral spasm). (Patient not taking: Reported on 07/15/2018) 10 tablet 0     I have reviewed patient's Past Medical Hx, Surgical Hx, Family Hx, Social Hx, medications and allergies.   ROS:  Review of Systems  Constitutional: Negative for chills and fever.  Gastrointestinal: Negative for constipation, diarrhea, nausea and vomiting.  Genitourinary: Positive for pelvic pain. Negative  for vaginal bleeding and vaginal discharge.   Other systems negative  Physical Exam   Patient Vitals for the past 24 hrs:  BP Temp Pulse Resp SpO2 Weight  08/29/18 2051 114/67 98.8 F (37.1 C) (!) 104 17 100 % 71 kg   Constitutional: Well-developed, well-nourished female in no acute distress.  Cardiovascular: normal rate and rhythm Respiratory: normal effort, clear to auscultation bilaterally GI: Abd soft, non-tender, gravid appropriate for gestational age.   No rebound or guarding. MS: Extremities nontender, no edema, normal ROM Neurologic: Alert and oriented x 4.  GU: Neg CVAT.  PELVIC EXAM:     Cervix long/closed/high  FHT:  Baseline 140 , moderate variability,  accelerations present, no decelerations Contractions: q 2-5 mins Irregular     Labs: Results for orders placed or performed during the hospital encounter of 08/29/18 (from the past 24 hour(s))  Urinalysis, Routine w reflex microscopic     Status: Abnormal   Collection Time: 08/29/18  8:47 PM  Result Value Ref Range   Color, Urine YELLOW YELLOW   APPearance HAZY (A) CLEAR   Specific Gravity, Urine 1.021 1.005 - 1.030   pH 6.0 5.0 - 8.0   Glucose, UA NEGATIVE NEGATIVE mg/dL   Hgb urine dipstick NEGATIVE NEGATIVE   Bilirubin Urine NEGATIVE NEGATIVE   Ketones, ur NEGATIVE NEGATIVE mg/dL   Protein, ur 30 (A) NEGATIVE mg/dL   Nitrite NEGATIVE NEGATIVE   Leukocytes,Ua SMALL (A) NEGATIVE   RBC / HPF 0-5 0 - 5 RBC/hpf   WBC, UA 11-20 0 - 5 WBC/hpf   Bacteria, UA MANY (A) NONE SEEN   Squamous Epithelial / LPF 0-5 0 - 5   Mucus PRESENT   Wet prep, genital     Status: Abnormal   Collection Time: 08/29/18  9:26 PM   Specimen: Vaginal  Result Value Ref Range   Yeast Wet Prep HPF POC NONE SEEN NONE SEEN   Trich, Wet Prep NONE SEEN NONE SEEN   Clue Cells Wet Prep HPF POC NONE SEEN NONE SEEN   WBC, Wet Prep HPF POC FEW (A) NONE SEEN   Sperm NONE SEEN   Fetal fibronectin     Status: None   Collection Time: 08/29/18  9:26 PM  Result Value Ref Range   Fetal Fibronectin NEGATIVE NEGATIVE    A/Positive/-- (04/30 0944)  Imaging:    MAU Course/MDM: I have ordered labs and reviewed results.   UA suspicious for UTI, culture sent  Will treat presumptively NST reviewed, reactive FHR.  Some irregular contractions.    Treatments in MAU included Procardia x 1.  Contractions lessened with this.  FFn resulted as negative.  Will hold further doses. .    Assessment: Single intrauterine pregnancy at [redacted]w[redacted]d Preterm uterine contractions Negative fetal fibronectin and no change in cervix Bacteria and leukocytes in urine Reassuring fetal heart rate tracing  Plan: Discharge home Preterm Labor  precautions and fetal kick counts Urine to culture Rx Keflex for presumptive treatment of UTI Follow up in Office for prenatal visits and recheck of status  Encouraged to return here or to other Urgent Care/ED if she develops worsening of symptoms, increase in pain, fever, or other concerning symptoms.   Pt stable at time of discharge.  Hansel Feinstein CNM, MSN Certified Nurse-Midwife 08/29/2018 9:17 PM

## 2018-08-31 LAB — CULTURE, OB URINE

## 2018-09-10 ENCOUNTER — Ambulatory Visit (INDEPENDENT_AMBULATORY_CARE_PROVIDER_SITE_OTHER): Payer: Medicaid Other | Admitting: Obstetrics and Gynecology

## 2018-09-10 ENCOUNTER — Other Ambulatory Visit: Payer: Self-pay

## 2018-09-10 VITALS — BP 101/69 | HR 106 | Wt 157.0 lb

## 2018-09-10 DIAGNOSIS — Z98891 History of uterine scar from previous surgery: Secondary | ICD-10-CM

## 2018-09-10 DIAGNOSIS — Z3480 Encounter for supervision of other normal pregnancy, unspecified trimester: Secondary | ICD-10-CM

## 2018-09-10 DIAGNOSIS — Z23 Encounter for immunization: Secondary | ICD-10-CM

## 2018-09-10 DIAGNOSIS — Z3482 Encounter for supervision of other normal pregnancy, second trimester: Secondary | ICD-10-CM

## 2018-09-10 DIAGNOSIS — Z3A27 27 weeks gestation of pregnancy: Secondary | ICD-10-CM

## 2018-09-10 NOTE — Progress Notes (Signed)
Prenatal Visit Note Date: 09/10/2018 Clinic: [facility]  Subjective:  Merranda Bolls is a 23 y.o. G2P1001 at [redacted]w[redacted]d being seen today for ongoing prenatal care.  She is currently monitored for the following issues for this low-risk pregnancy and has Cervical dysplasia; Supervision of other normal pregnancy, antepartum; History of cesarean delivery; Alpha thalassemia silent carrier; and Sciatica on their problem list.  Patient reports low back pain and discomfort; had neg mau eval earlier this month.   Contractions: Irritability. Vag. Bleeding: None.  Movement: Present. Denies leaking of fluid.   The following portions of the patient's history were reviewed and updated as appropriate: allergies, current medications, past family history, past medical history, past social history, past surgical history and problem list. Problem list updated.  Objective:   Vitals:   09/10/18 0826  BP: 101/69  Pulse: (!) 106  Weight: 157 lb (71.2 kg)    Fetal Status: Fetal Heart Rate (bpm): 156   Movement: Present     General:  Alert, oriented and cooperative. Patient is in no acute distress.  Skin: Skin is warm and dry. No rash noted.   Cardiovascular: Normal heart rate noted  Respiratory: Normal respiratory effort, no problems with respiration noted  Abdomen: Soft, gravid, appropriate for gestational age. Pain/Pressure: Present     Pelvic:  Cervical exam deferred        Extremities: Normal range of motion.  Edema: None  Mental Status: Normal mood and affect. Normal behavior. Normal judgment and thought content.   Urinalysis:      Assessment and Plan:  Pregnancy: G2P1001 at [redacted]w[redacted]d  1. Supervision of other normal pregnancy, antepartum Routine care. Pregnancy belt recommended. D/w her re: r/b/a and she desires btl. Will sign paperwork today.  - CBC - RPR - HIV Antibody (routine testing w rflx) - Glucose Tolerance, 2 Hours w/1 Hour  2. History of cesarean delivery Desires rpt.  R/b/a d/w her. Will set up for 39wks (email sent)  Preterm labor symptoms and general obstetric precautions including but not limited to vaginal bleeding, contractions, leaking of fluid and fetal movement were reviewed in detail with the patient. Please refer to After Visit Summary for other counseling recommendations.  Return in about 1 month (around 10/11/2018) for low risk, can be in person or virtual.   Aletha Halim, MD

## 2018-09-10 NOTE — Patient Instructions (Signed)
Consider getting a pregnancy belt

## 2018-09-11 LAB — HIV ANTIBODY (ROUTINE TESTING W REFLEX): HIV Screen 4th Generation wRfx: NONREACTIVE

## 2018-09-11 LAB — RPR: RPR Ser Ql: NONREACTIVE

## 2018-09-11 LAB — CBC
Hematocrit: 33 % — ABNORMAL LOW (ref 34.0–46.6)
Hemoglobin: 10.6 g/dL — ABNORMAL LOW (ref 11.1–15.9)
MCH: 25.2 pg — ABNORMAL LOW (ref 26.6–33.0)
MCHC: 32.1 g/dL (ref 31.5–35.7)
MCV: 79 fL (ref 79–97)
Platelets: 281 10*3/uL (ref 150–450)
RBC: 4.2 x10E6/uL (ref 3.77–5.28)
RDW: 14.3 % (ref 11.7–15.4)
WBC: 11 10*3/uL — ABNORMAL HIGH (ref 3.4–10.8)

## 2018-09-11 LAB — GLUCOSE TOLERANCE, 2 HOURS W/ 1HR
Glucose, 1 hour: 153 mg/dL (ref 65–179)
Glucose, 2 hour: 119 mg/dL (ref 65–152)
Glucose, Fasting: 89 mg/dL (ref 65–91)

## 2018-09-11 MED ORDER — FERROUS GLUCONATE 324 (38 FE) MG PO TABS: 324.0000 mg | ORAL_TABLET | Freq: Every day | ORAL | 1 refills | Status: DC

## 2018-09-11 NOTE — Addendum Note (Signed)
Addended by: Aletha Halim on: 09/11/2018 01:16 PM   Modules accepted: Orders

## 2018-09-13 ENCOUNTER — Ambulatory Visit: Payer: Medicaid Other | Attending: Family Medicine | Admitting: Physical Therapy

## 2018-09-28 ENCOUNTER — Encounter (HOSPITAL_COMMUNITY): Payer: Self-pay | Admitting: *Deleted

## 2018-09-28 ENCOUNTER — Other Ambulatory Visit: Payer: Self-pay

## 2018-09-28 ENCOUNTER — Inpatient Hospital Stay (HOSPITAL_COMMUNITY)
Admission: AD | Admit: 2018-09-28 | Discharge: 2018-09-28 | Disposition: A | Discharge status: Home / Self Care | Payer: Medicaid Other | Attending: Obstetrics & Gynecology | Admitting: Obstetrics & Gynecology

## 2018-09-28 DIAGNOSIS — O2653 Maternal hypotension syndrome, third trimester: Secondary | ICD-10-CM | POA: Diagnosis not present

## 2018-09-28 DIAGNOSIS — O26893 Other specified pregnancy related conditions, third trimester: Secondary | ICD-10-CM | POA: Insufficient documentation

## 2018-09-28 DIAGNOSIS — R42 Dizziness and giddiness: Secondary | ICD-10-CM | POA: Diagnosis not present

## 2018-09-28 DIAGNOSIS — Z3A29 29 weeks gestation of pregnancy: Secondary | ICD-10-CM

## 2018-09-28 LAB — URINALYSIS, ROUTINE W REFLEX MICROSCOPIC
Bilirubin Urine: NEGATIVE
Glucose, UA: NEGATIVE mg/dL
Hgb urine dipstick: NEGATIVE
Ketones, ur: NEGATIVE mg/dL
Leukocytes,Ua: NEGATIVE
Nitrite: NEGATIVE
Protein, ur: 30 mg/dL — AB
Specific Gravity, Urine: 1.026 (ref 1.005–1.030)
pH: 6 (ref 5.0–8.0)

## 2018-09-28 LAB — COMPREHENSIVE METABOLIC PANEL
ALT: 11 U/L (ref 0–44)
AST: 17 U/L (ref 15–41)
Albumin: 3 g/dL — ABNORMAL LOW (ref 3.5–5.0)
Alkaline Phosphatase: 77 U/L (ref 38–126)
Anion gap: 10 (ref 5–15)
BUN: 5 mg/dL — ABNORMAL LOW (ref 6–20)
CO2: 21 mmol/L — ABNORMAL LOW (ref 22–32)
Calcium: 8.7 mg/dL — ABNORMAL LOW (ref 8.9–10.3)
Chloride: 104 mmol/L (ref 98–111)
Creatinine, Ser: 0.66 mg/dL (ref 0.44–1.00)
GFR calc Af Amer: >60 mL/min (ref >60)
GFR calc non Af Amer: >60 mL/min (ref >60)
Glucose, Bld: 113 mg/dL — ABNORMAL HIGH (ref 70–99)
Potassium: 3.8 mmol/L (ref 3.5–5.1)
Sodium: 135 mmol/L (ref 135–145)
Total Bilirubin: 0.7 mg/dL (ref 0.3–1.2)
Total Protein: 6.1 g/dL — ABNORMAL LOW (ref 6.5–8.1)

## 2018-09-28 LAB — RAPID URINE DRUG SCREEN, HOSP PERFORMED
Amphetamines: NOT DETECTED
Barbiturates: NOT DETECTED
Benzodiazepines: NOT DETECTED
Cocaine: NOT DETECTED
Opiates: NOT DETECTED
Tetrahydrocannabinol: NOT DETECTED

## 2018-09-28 LAB — CBC WITH DIFFERENTIAL/PLATELET
Abs Immature Granulocytes: 0.13 10*3/uL — ABNORMAL HIGH (ref 0.00–0.07)
Basophils Absolute: 0 10*3/uL (ref 0.0–0.1)
Basophils Relative: 0 %
Eosinophils Absolute: 0 10*3/uL (ref 0.0–0.5)
Eosinophils Relative: 0 %
HCT: 32.9 % — ABNORMAL LOW (ref 36.0–46.0)
Hemoglobin: 10.3 g/dL — ABNORMAL LOW (ref 12.0–15.0)
Immature Granulocytes: 1 %
Lymphocytes Relative: 17 %
Lymphs Abs: 1.9 10*3/uL (ref 0.7–4.0)
MCH: 25.2 pg — ABNORMAL LOW (ref 26.0–34.0)
MCHC: 31.3 g/dL (ref 30.0–36.0)
MCV: 80.4 fL (ref 80.0–100.0)
Monocytes Absolute: 0.6 10*3/uL (ref 0.1–1.0)
Monocytes Relative: 5 %
Neutro Abs: 8.6 10*3/uL — ABNORMAL HIGH (ref 1.7–7.7)
Neutrophils Relative %: 77 %
Platelets: 325 10*3/uL (ref 150–400)
RBC: 4.09 MIL/uL (ref 3.87–5.11)
RDW: 15 % (ref 11.5–15.5)
WBC: 11.3 10*3/uL — ABNORMAL HIGH (ref 4.0–10.5)
nRBC: 0.2 % (ref 0.0–0.2)

## 2018-09-28 MED ORDER — LACTATED RINGERS IV BOLUS
1000.0000 mL | Freq: Once | INTRAVENOUS | Status: AC
  Administered 2018-09-28: 1000 mL via INTRAVENOUS

## 2018-09-28 NOTE — MAU Provider Note (Signed)
History     CSN: [id number]  Arrival date and time: 09/28/18 1810   First Provider Initiated Contact with Patient 09/28/18 1846      Chief Complaint  Patient presents with  . Dizziness   Shirley Greene is a 23 y.o. G2P1001 at 101w5d who presents today with dizziness. She states that this has been going on daily since she did her GTT about 2 weeks ago. She states that it is getting worse, and today it has been the worst it has been.   She denies any contractions, VB or LOF. She reports normal fetal movement.   Dizziness This is a new problem. Episode onset: 2 weeks. The problem occurs intermittently. The problem has been gradually worsening. Associated symptoms include nausea. Pertinent negatives include no chills, fever or vomiting. The symptoms are aggravated by standing and exertion. She has tried nothing for the symptoms.    OB History    Gravida  2   Para  1   Term  1   Preterm      AB      Living  1     SAB      TAB      Ectopic      Multiple      Live Births  1        Obstetric Comments  "OP" presentation, got stuck at 9cm        Past Medical History:  Diagnosis Date  . Anemia   . Chlamydia   . Infection    UTI    Past Surgical History:  Procedure Laterality Date  . CESAREAN SECTION N/A 04/05/2012   Procedure: CESAREAN SECTION;  Surgeon: Jonnie Kind, MD;  Location: [facility];  Service: Obstetrics;  Laterality: N/A;    Family History  Problem Relation Age of Onset  . Hypertension Mother   . Hypertension Father   . Cancer Maternal Grandfather     Social History   Tobacco Use  . Smoking status: Never Smoker  . Smokeless tobacco: Never Used  Substance Use Topics  . Alcohol use: Not Currently    Comment: twice a month  . Drug use: No    Allergies: No Known Allergies  Medications Prior to Admission  Medication Sig Dispense Refill Last Dose  . acetaminophen (TYLENOL) 500 MG tablet Take 1 tablet (500 mg total) by mouth every 6  (six) hours as needed. (Patient not taking: Reported on 07/31/2018) 30 tablet 0   . cephALEXin (KEFLEX) 500 MG capsule Take 1 capsule (500 mg total) by mouth every 6 (six) hours. (Patient not taking: Reported on 09/10/2018) 28 capsule 0   . Cetirizine HCl 10 MG CAPS Take 1 capsule (10 mg total) by mouth daily for 10 days. 15 capsule 0   . ferrous gluconate (FERGON) 324 MG tablet Take 1 tablet (324 mg total) by mouth daily with breakfast. 60 tablet 1   . ferrous sulfate 325 (65 FE) MG EC tablet Take 325 mg by mouth 3 (three) times daily with meals.     . nitrofurantoin, macrocrystal-monohydrate, (MACROBID) 100 MG capsule Take 1 capsule (100 mg total) by mouth 2 (two) times daily. (Patient not taking: Reported on 07/15/2018) 14 capsule 0   . phenazopyridine (PYRIDIUM) 200 MG tablet Take 1 tablet (200 mg total) by mouth 3 (three) times daily as needed for pain (urethral spasm). (Patient not taking: Reported on 07/15/2018) 10 tablet 0   . Prenatal Vit-Fe Fumarate-FA (PRENATAL MULTIVITAMIN) TABS tablet Take 1 tablet  by mouth daily at 12 noon.       Review of Systems  Constitutional: Negative for chills and fever.  Gastrointestinal: Positive for nausea. Negative for vomiting.  Genitourinary: Negative for dysuria, frequency, pelvic pain, vaginal bleeding and vaginal discharge.  Neurological: Positive for dizziness.   Physical Exam   Blood pressure 115/68, pulse (!) 121, temperature 98.3 F (36.8 C), temperature source Oral, resp. rate 18, weight 71.2 kg, last menstrual period 03/04/2018, SpO2 100 %.  Physical Exam  Nursing note and vitals reviewed. Constitutional: She is oriented to person, place, and time. She appears well-developed and well-nourished. No distress.  HENT:  Head: Normocephalic.  Cardiovascular: Normal rate.  Respiratory: Effort normal.  GI: Soft. There is no abdominal tenderness. There is no rebound.  Neurological: She is alert and oriented to person, place, and time.  Skin: Skin  is warm and dry.  Psychiatric: She has a normal mood and affect.   NST:  Baseline: 150 Variability: moderate Accels: 15x15 Decels: none Toco: none    Patient Vitals for the past 24 hrs:  BP Temp Temp src Pulse Resp SpO2 Weight  09/28/18 1843 110/63 - - (!) 115 - - -  09/28/18 1842 (!) 112/56 - - (!) 109 - - -  09/28/18 1835 115/68 98.3 F (36.8 C) Oral (!) 121 18 100 % -  09/28/18 1826 - - - - - - 71.2 kg   Results for orders placed or performed during the hospital encounter of 09/28/18 (from the past 24 hour(s))  Urinalysis, Routine w reflex microscopic     Status: Abnormal   Collection Time: 09/28/18  6:30 PM  Result Value Ref Range   Color, Urine YELLOW YELLOW   APPearance HAZY (A) CLEAR   Specific Gravity, Urine 1.026 1.005 - 1.030   pH 6.0 5.0 - 8.0   Glucose, UA NEGATIVE NEGATIVE mg/dL   Hgb urine dipstick NEGATIVE NEGATIVE   Bilirubin Urine NEGATIVE NEGATIVE   Ketones, ur NEGATIVE NEGATIVE mg/dL   Protein, ur 30 (A) NEGATIVE mg/dL   Nitrite NEGATIVE NEGATIVE   Leukocytes,Ua NEGATIVE NEGATIVE   RBC / HPF 0-5 0 - 5 RBC/hpf   WBC, UA 0-5 0 - 5 WBC/hpf   Bacteria, UA RARE (A) NONE SEEN   Squamous Epithelial / LPF 0-5 0 - 5   Mucus PRESENT    Ca Oxalate Crys, UA PRESENT   CBC with Differential/Platelet     Status: Abnormal   Collection Time: 09/28/18  6:49 PM  Result Value Ref Range   WBC 11.3 (H) 4.0 - 10.5 K/uL   RBC 4.09 3.87 - 5.11 MIL/uL   Hemoglobin 10.3 (L) 12.0 - 15.0 g/dL   HCT 71.0 (L) 62.6 - 94.8 %   MCV 80.4 80.0 - 100.0 fL   MCH 25.2 (L) 26.0 - 34.0 pg   MCHC 31.3 30.0 - 36.0 g/dL   RDW 54.6 27.0 - 35.0 %   Platelets 325 150 - 400 K/uL   nRBC 0.2 0.0 - 0.2 %   Neutrophils Relative % 77 %   Neutro Abs 8.6 (H) 1.7 - 7.7 K/uL   Lymphocytes Relative 17 %   Lymphs Abs 1.9 0.7 - 4.0 K/uL   Monocytes Relative 5 %   Monocytes Absolute 0.6 0.1 - 1.0 K/uL   Eosinophils Relative 0 %   Eosinophils Absolute 0.0 0.0 - 0.5 K/uL   Basophils Relative 0 %    Basophils Absolute 0.0 0.0 - 0.1 K/uL   Immature Granulocytes 1 %  Abs Immature Granulocytes 0.13 (H) 0.00 - 0.07 K/uL  Comprehensive metabolic panel     Status: Abnormal   Collection Time: 09/28/18  6:49 PM  Result Value Ref Range   Sodium 135 135 - 145 mmol/L   Potassium 3.8 3.5 - 5.1 mmol/L   Chloride 104 98 - 111 mmol/L   CO2 21 (L) 22 - 32 mmol/L   Glucose, Bld 113 (H) 70 - 99 mg/dL   BUN 5 (L) 6 - 20 mg/dL   Creatinine, Ser 1.610.66 0.44 - 1.00 mg/dL   Calcium 8.7 (L) 8.9 - 10.3 mg/dL   Total Protein 6.1 (L) 6.5 - 8.1 g/dL   Albumin 3.0 (L) 3.5 - 5.0 g/dL   AST 17 15 - 41 U/L   ALT 11 0 - 44 U/L   Alkaline Phosphatase 77 38 - 126 U/L   Total Bilirubin 0.7 0.3 - 1.2 mg/dL   GFR calc non Af Amer >60 >60 mL/min   GFR calc Af Amer >60 >60 mL/min   Anion gap 10 5 - 15    MAU Course  Procedures  MDM Patient has had 1L or LR. She reports that she is feeling better. Patient did get hypotensive while standing for orthostatic VS. DW patient that this is consistent with orthostatic hypotension that can be normal during pregnancy.   Assessment and Plan   1. Maternal hypotension syndrome in third trimester   2. [redacted] weeks gestation of pregnancy   3. Dizziness    DC home Comfort measures reviewed  3rd Trimester precautions s PTL precautions  Fetal kick counts RX: none  Return to MAU as needed FU with OB as planned  Follow-up Information    [facility].   Specialty: Obstetrics and Gynecology Contact information: [address] [place] [id number] [phone]         Thressa ShellerHeather Ariea Rochin DNP, CNM  09/28/18  8:29 PM

## 2018-09-28 NOTE — Discharge Instructions (Signed)
Orthostatic Hypotension °Blood pressure is a measurement of how strongly, or weakly, your blood is pressing against the walls of your arteries. Orthostatic hypotension is a sudden drop in blood pressure that happens when you quickly change positions, such as when you get up from sitting or lying down. °Arteries are blood vessels that carry blood from your heart throughout your body. When blood pressure is too low, you may not get enough blood to your brain or to the rest of your organs. This can cause weakness, light-headedness, rapid heartbeat, and fainting. This can last for just a few seconds or for up to a few minutes. Orthostatic hypotension is usually not a serious problem. However, if it happens frequently or gets worse, it may be a sign of something more serious. °What are the causes? °This condition may be caused by: °· Sudden changes in posture, such as standing up quickly after you have been sitting or lying down. °· Blood loss. °· Loss of body fluids (dehydration). °· Heart problems. °· Hormone (endocrine) problems. °· Pregnancy. °· Severe infection. °· Lack of certain nutrients. °· Severe allergic reactions (anaphylaxis). °· Certain medicines, such as blood pressure medicine or medicines that make the body lose excess fluids (diuretics). Sometimes, this condition can be caused by not taking medicine as directed, such as taking too much of a certain medicine. °What increases the risk? °The following factors may make you more likely to develop this condition: °· Age. Risk increases as you get older. °· Conditions that affect the heart or the central nervous system. °· Taking certain medicines, such as blood pressure medicine or diuretics. °· Being pregnant. °What are the signs or symptoms? °Symptoms of this condition may include: °· Weakness. °· Light-headedness. °· Dizziness. °· Blurred vision. °· Fatigue. °· Rapid heartbeat. °· Fainting, in severe cases. °How is this diagnosed? °This condition is  diagnosed based on: °· Your medical history. °· Your symptoms. °· Your blood pressure measurement. Your health care provider will check your blood pressure when you are: °? Lying down. °? Sitting. °? Standing. °A blood pressure reading is recorded as two numbers, such as "120 over 80" (or 120/80). The first ("top") number is called the systolic pressure. It is a measure of the pressure in your arteries as your heart beats. The second ("bottom") number is called the diastolic pressure. It is a measure of the pressure in your arteries when your heart relaxes between beats. Blood pressure is measured in a unit called mm Hg. Healthy blood pressure for most adults is 120/80. If your blood pressure is below 90/60, you may be diagnosed with hypotension. °Other information or tests that may be used to diagnose orthostatic hypotension include: °· Your other vital signs, such as your heart rate and temperature. °· Blood tests. °· Tilt table test. For this test, you will be safely secured to a table that moves you from a lying position to an upright position. Your heart rhythm and blood pressure will be monitored during the test. °How is this treated? °This condition may be treated by: °· Changing your diet. This may involve eating more salt (sodium) or drinking more water. °· Taking medicines to raise your blood pressure. °· Changing the dosage of certain medicines you are taking that might be lowering your blood pressure. °· Wearing compression stockings. These stockings help to prevent blood clots and reduce swelling in your legs. °In some cases, you may need to go to the hospital for: °· Fluid replacement. This means you will   receive fluids through an IV. °· Blood replacement. This means you will receive donated blood through an IV (transfusion). °· Treating an infection or heart problems, if this applies. °· Monitoring. You may need to be monitored while medicines that you are taking wear off. °Follow these instructions  at home: °Eating and drinking ° °· Drink enough fluid to keep your urine pale yellow. °· Eat a healthy diet, and follow instructions from your health care provider about eating or drinking restrictions. A healthy diet includes: °? Fresh fruits and vegetables. °? Whole grains. °? Lean meats. °? Low-fat dairy products. °· Eat extra salt only as directed. Do not add extra salt to your diet unless your health care provider told you to do that. °· Eat frequent, small meals. °· Avoid standing up suddenly after eating. °Medicines °· Take over-the-counter and prescription medicines only as told by your health care provider. °? Follow instructions from your health care provider about changing the dosage of your current medicines, if this applies. °? Do not stop or adjust any of your medicines on your own. °General instructions ° °· Wear compression stockings as told by your health care provider. °· Get up slowly from lying down or sitting positions. This gives your blood pressure a chance to adjust. °· Avoid hot showers and excessive heat as directed by your health care provider. °· Return to your normal activities as told by your health care provider. Ask your health care provider what activities are safe for you. °· Do not use any products that contain nicotine or tobacco, such as cigarettes, e-cigarettes, and chewing tobacco. If you need help quitting, ask your health care provider. °· Keep all follow-up visits as told by your health care provider. This is important. °Contact a health care provider if you: °· Vomit. °· Have diarrhea. °· Have a fever for more than 2-3 days. °· Feel more thirsty than usual. °· Feel weak and tired. °Get help right away if you: °· Have chest pain. °· Have a fast or irregular heartbeat. °· Develop numbness in any part of your body. °· Cannot move your arms or your legs. °· Have trouble speaking. °· Become sweaty or feel light-headed. °· Faint. °· Feel short of breath. °· Have trouble staying  awake. °· Feel confused. °Summary °· Orthostatic hypotension is a sudden drop in blood pressure that happens when you quickly change positions. °· Orthostatic hypotension is usually not a serious problem. °· It is diagnosed by having your blood pressure taken lying down, sitting, and then standing. °· It may be treated by changing your diet or adjusting your medicines. °This information is not intended to replace advice given to you by your health care provider. Make sure you discuss any questions you have with your health care provider. °Document Released: 12/23/2001 Document Revised: 06/28/2017 Document Reviewed: 06/28/2017 °Elsevier Patient Education © 2020 [employer] ° °

## 2018-09-28 NOTE — MAU Note (Signed)
Shirley Greene is a 23 y.o. at [redacted]w[redacted]d here in MAU reporting:   "I have been really really faint." "something I have never felt before" States she is having to sit down or have someone stand next to her for fear of feeling like she is going to "fall out".   +blurred vision today  "just feeling really bad today"  Has eaten cereal, taken her iron and prenatals, [employer], and cake.   Onset of complaint: happening almost everyday since her office glucose test Pain score: intermittent tightness in abdomen but none currently Vitals:   09/28/18 1835  BP: 115/68  Pulse: (!) 121  Resp: 18  Temp: 98.3 F (36.8 C)  SpO2: 100%     FHT:+FM; 163 Lab orders placed from triage: ua

## 2018-10-01 ENCOUNTER — Other Ambulatory Visit: Payer: Self-pay

## 2018-10-01 ENCOUNTER — Ambulatory Visit (INDEPENDENT_AMBULATORY_CARE_PROVIDER_SITE_OTHER): Payer: Medicaid Other | Admitting: Obstetrics and Gynecology

## 2018-10-01 VITALS — BP 100/66 | HR 112 | Wt 155.8 lb

## 2018-10-01 DIAGNOSIS — Z98891 History of uterine scar from previous surgery: Secondary | ICD-10-CM

## 2018-10-01 DIAGNOSIS — Z3480 Encounter for supervision of other normal pregnancy, unspecified trimester: Secondary | ICD-10-CM

## 2018-10-01 DIAGNOSIS — Z3483 Encounter for supervision of other normal pregnancy, third trimester: Secondary | ICD-10-CM

## 2018-10-01 NOTE — Progress Notes (Signed)
Pt. Concerned that when she stands up BP drops. This has been happening since Saturday 09/28/2018. Pt. Complains of weakness and dizziness. Pt. Wants to discuss options and hospital visit.

## 2018-10-02 ENCOUNTER — Encounter: Payer: Self-pay | Admitting: Obstetrics and Gynecology

## 2018-10-02 NOTE — Progress Notes (Signed)
Prenatal Visit Note Date: 10/01/2018 Clinic: [facility]  Subjective:  Shirley Greene is a 23 y.o. G2P1001 at [redacted]w[redacted]d being seen today for ongoing prenatal care.  She is currently monitored for the following issues for this low-risk pregnancy and has Supervision of other normal pregnancy, antepartum; History of cesarean delivery; Alpha thalassemia silent carrier; and Sciatica on their problem list.  Patient reports some dizziness when standing for prolonged periods of time.  no chest pain, sob. Contractions: Not present. Vag. Bleeding: None.  Movement: Present. Denies leaking of fluid.   The following portions of the patient's history were reviewed and updated as appropriate: allergies, current medications, past family history, past medical history, past social history, past surgical history and problem list. Problem list updated.  Objective:   Vitals:   10/01/18 1552  BP: 100/66  Pulse: (!) 112  Weight: 155 lb 12.8 oz (70.7 kg)    Fetal Status: Fetal Heart Rate (bpm): 156 Fundal Height: 30 cm Movement: Present     General:  Alert, oriented and cooperative. Patient is in no acute distress.  Skin: Skin is warm and dry. No rash noted.   Cardiovascular: Normal heart rate noted  Respiratory: Normal respiratory effort, no problems with respiration noted  Abdomen: Soft, gravid, appropriate for gestational age. Pain/Pressure: Present     Pelvic:  Cervical exam deferred        Extremities: Normal range of motion.  Edema: None  Mental Status: Normal mood and affect. Normal behavior. Normal judgment and thought content.   Urinalysis:      Assessment and Plan:  Pregnancy: G2P1001 at [redacted]w[redacted]d  1. Supervision of other normal pregnancy, antepartum Routine care. Likely due to gravid uterus reducing pre-load. Belly band, precautions recommended  2. History of cesarean delivery Already scheduled for rpt  Preterm labor symptoms and general obstetric precautions including  but not limited to vaginal bleeding, contractions, leaking of fluid and fetal movement were reviewed in detail with the patient. Please refer to After Visit Summary for other counseling recommendations.  Return in about 2 weeks (around 10/15/2018) for 2-3wk low risk. in person or virtual.   Aletha Halim, MD

## 2018-10-08 ENCOUNTER — Encounter: Payer: Medicaid Other | Admitting: Obstetrics & Gynecology

## 2018-10-17 ENCOUNTER — Ambulatory Visit (INDEPENDENT_AMBULATORY_CARE_PROVIDER_SITE_OTHER): Payer: Medicaid Other | Admitting: Obstetrics and Gynecology

## 2018-10-17 ENCOUNTER — Other Ambulatory Visit: Payer: Self-pay

## 2018-10-17 ENCOUNTER — Other Ambulatory Visit (HOSPITAL_COMMUNITY)
Admission: RE | Admit: 2018-10-17 | Discharge: 2018-10-17 | Disposition: A | Discharge status: Home / Self Care | Payer: Medicaid Other | Source: Ambulatory Visit | Attending: Obstetrics and Gynecology | Admitting: Obstetrics and Gynecology

## 2018-10-17 VITALS — BP 102/68 | HR 102 | Wt 156.0 lb

## 2018-10-17 DIAGNOSIS — Z3A32 32 weeks gestation of pregnancy: Secondary | ICD-10-CM

## 2018-10-17 DIAGNOSIS — N898 Other specified noninflammatory disorders of vagina: Secondary | ICD-10-CM | POA: Insufficient documentation

## 2018-10-17 DIAGNOSIS — O26893 Other specified pregnancy related conditions, third trimester: Secondary | ICD-10-CM

## 2018-10-17 DIAGNOSIS — Z3480 Encounter for supervision of other normal pregnancy, unspecified trimester: Secondary | ICD-10-CM

## 2018-10-17 DIAGNOSIS — K5900 Constipation, unspecified: Secondary | ICD-10-CM

## 2018-10-17 MED ORDER — PROMETHAZINE HCL 25 MG PO TABS: 25.0000 mg | ORAL_TABLET | Freq: Four times a day (QID) | ORAL | 1 refills | Status: DC | PRN

## 2018-10-17 MED ORDER — POLYETHYLENE GLYCOL 3350 17 G PO PACK: 17.0000 g | PACK | Freq: Every day | ORAL | 1 refills | Status: DC

## 2018-10-17 NOTE — Progress Notes (Signed)
Prenatal Visit Note Date: 10/17/2018 Clinic: [facility]  Subjective:  Shirley Greene is a 23 y.o. G2P1001 at [redacted]w[redacted]d being seen today for ongoing prenatal care.  She is currently monitored for the following issues for this low-risk pregnancy and has Supervision of other normal pregnancy, antepartum; History of cesarean delivery; Alpha thalassemia silent carrier; and Sciatica on their problem list.  Patient reports vaginal discharge, occasional nausea and some insomnia.   Contractions: Irregular. Vag. Bleeding: None.  Movement: Present. Denies leaking of fluid.   The following portions of the patient's history were reviewed and updated as appropriate: allergies, current medications, past family history, past medical history, past social history, past surgical history and problem list. Problem list updated.  Objective:   Vitals:   10/17/18 1104  BP: 102/68  Pulse: (!) 102  Weight: 156 lb (70.8 kg)    Fetal Status: Fetal Heart Rate (bpm): 150s Fundal Height: 32 cm Movement: Present     General:  Alert, oriented and cooperative. Patient is in no acute distress.  Skin: Skin is warm and dry. No rash noted.   Cardiovascular: Normal heart rate noted  Respiratory: Normal respiratory effort, no problems with respiration noted  Abdomen: Soft, gravid, appropriate for gestational age. Pain/Pressure: Present     Pelvic:  Cervical exam performed Dilation: Closed Effacement (%): Thick    EGBUS normal Vag vault: normal white discharge Cervix: visually closed, negative cough  Extremities: Normal range of motion.  Edema: None  Mental Status: Normal mood and affect. Normal behavior. Normal judgment and thought content.   Urinalysis:      Assessment and Plan:  Pregnancy: G2P1001 at [redacted]w[redacted]d  1. Supervision of other normal pregnancy, antepartum Routine care. Normal vag discharge. Pt leaning towards doing the patch again. I don't see btl papers scanned in from last week  but I told her that since she's unsure and her age that I wouldn't recommend doing it.  She is certain that she does want a rpt c/s.  Rpt c/s already scheduled - Cervicovaginal ancillary only( [place])  2. Vaginal discharge - Cervicovaginal ancillary only( )  3. Constipation, unspecified constipation type Miralax, phenergan sent in. Can do OTC unisom and melatoning for sleep. Already has pregnancy pillow  Preterm labor symptoms and general obstetric precautions including but not limited to vaginal bleeding, contractions, leaking of fluid and fetal movement were reviewed in detail with the patient. Please refer to After Visit Summary for other counseling recommendations.  Return in about 2 weeks (around 10/31/2018).   Aletha Halim, MD

## 2018-10-17 NOTE — Progress Notes (Signed)
Pt. States a moderate amount vaginal discharged yesterday   Pt. Sates she has some vaginal irration present for the last week and half.   Pt. States she has began feeling nauseous

## 2018-10-21 LAB — CERVICOVAGINAL ANCILLARY ONLY
Bacterial Vaginitis (gardnerella): NEGATIVE
Candida Glabrata: NEGATIVE
Candida Vaginitis: POSITIVE — AB
Chlamydia: NEGATIVE
Neisseria Gonorrhea: NEGATIVE
Trichomonas: NEGATIVE

## 2018-10-22 ENCOUNTER — Other Ambulatory Visit: Payer: Self-pay | Admitting: Obstetrics and Gynecology

## 2018-10-22 MED ORDER — MICONAZOLE NITRATE 2 % VA CREA: 1.0000 | TOPICAL_CREAM | Freq: Every day | VAGINAL | 2 refills | Status: DC

## 2018-10-28 ENCOUNTER — Ambulatory Visit (INDEPENDENT_AMBULATORY_CARE_PROVIDER_SITE_OTHER): Payer: Medicaid Other | Admitting: Family Medicine

## 2018-10-28 ENCOUNTER — Other Ambulatory Visit: Payer: Self-pay

## 2018-10-28 VITALS — BP 100/66 | HR 98 | Wt 157.1 lb

## 2018-10-28 DIAGNOSIS — Z3480 Encounter for supervision of other normal pregnancy, unspecified trimester: Secondary | ICD-10-CM

## 2018-10-28 DIAGNOSIS — Z98891 History of uterine scar from previous surgery: Secondary | ICD-10-CM

## 2018-10-28 DIAGNOSIS — Z3A34 34 weeks gestation of pregnancy: Secondary | ICD-10-CM

## 2018-10-28 DIAGNOSIS — O26893 Other specified pregnancy related conditions, third trimester: Secondary | ICD-10-CM

## 2018-10-28 DIAGNOSIS — L299 Pruritus, unspecified: Secondary | ICD-10-CM

## 2018-10-28 MED ORDER — HYDROXYZINE PAMOATE 50 MG PO CAPS: 50.0000 mg | ORAL_CAPSULE | Freq: Three times a day (TID) | ORAL | 2 refills | Status: DC | PRN

## 2018-10-28 NOTE — Progress Notes (Signed)
    PRENATAL VISIT NOTE  Subjective:  Shirley Greene is a 23 y.o. G2P1001 at [redacted]w[redacted]d being seen today for ongoing prenatal care.  She is currently monitored for the following issues for this low-risk pregnancy and has Supervision of other normal pregnancy, antepartum; History of cesarean delivery; Alpha thalassemia silent carrier; and Sciatica on their problem list.  Patient reports itching, worse at night, worse on palms and soles, but all over. Has tried hydrocortisone cream and Benadryl which has not helped..  Contractions: Not present.  .  Movement: Present. Denies leaking of fluid.   The following portions of the patient's history were reviewed and updated as appropriate: allergies, current medications, past family history, past medical history, past social history, past surgical history and problem list.   Objective:   Vitals:   10/28/18 1308  BP: 100/66  Pulse: 98  Weight: 157 lb 1.6 oz (71.3 kg)    Fetal Status: Fetal Heart Rate (bpm): 154 Fundal Height: 35 cm Movement: Present     General:  Alert, oriented and cooperative. Patient is in no acute distress.  Skin: Skin is warm and dry. No rash noted.   Cardiovascular: Normal heart rate noted  Respiratory: Normal respiratory effort, no problems with respiration noted  Abdomen: Soft, gravid, appropriate for gestational age.  Pain/Pressure: Absent     Pelvic: Cervical exam deferred        Extremities: Normal range of motion.  Edema: None  Mental Status: Normal mood and affect. Normal behavior. Normal judgment and thought content.   Assessment and Plan:  Pregnancy: G2P1001 at 106w0d 1. Supervision of other normal pregnancy, antepartum Continue routine prenatal care.   2. History of cesarean delivery For RCS at 39 wks  3. Pruritus Sounds typical for cholestasis, no change in detergent, soaps, etc. Will check bile acids. Trial of Vistaril. If + needs testing, u/s for growth, actigall and change in delivery plan to 37 wks.  Discussed with patient. - Bile acids, total - hydrOXYzine (VISTARIL) 50 MG capsule; Take 1 capsule (50 mg total) by mouth 3 (three) times daily as needed. (Patient not taking: Reported on 10/28/2018)  Dispense: 90 capsule; Refill: 2 - Comprehensive metabolic panel  Preterm labor symptoms and general obstetric precautions including but not limited to vaginal bleeding, contractions, leaking of fluid and fetal movement were reviewed in detail with the patient. Please refer to After Visit Summary for other counseling recommendations.   Return in about 1 week (around 11/04/2018) for in person, OB visit and NST? if abnl labs.  Future Appointments  Date Time Provider Charlo  11/04/2018  3:30 PM Sloan Leiter, MD [facility]    Donnamae Jude, MD

## 2018-10-28 NOTE — Patient Instructions (Signed)

## 2018-10-28 NOTE — Progress Notes (Signed)
Patient reports itching all over x 4 days. She has taken benadryl with no relief.

## 2018-10-29 ENCOUNTER — Encounter: Payer: Medicaid Other | Admitting: Obstetrics and Gynecology

## 2018-10-29 LAB — COMPREHENSIVE METABOLIC PANEL
ALT: 7 IU/L (ref 0–32)
AST: 13 IU/L (ref 0–40)
Albumin/Globulin Ratio: 1.6 (ref 1.2–2.2)
Albumin: 3.6 g/dL — ABNORMAL LOW (ref 3.9–5.0)
Alkaline Phosphatase: 117 IU/L (ref 39–117)
BUN/Creatinine Ratio: 6 — ABNORMAL LOW (ref 9–23)
BUN: 4 mg/dL — ABNORMAL LOW (ref 6–20)
Bilirubin Total: 0.4 mg/dL (ref 0.0–1.2)
CO2: 18 mmol/L — ABNORMAL LOW (ref 20–29)
Calcium: 9 mg/dL (ref 8.7–10.2)
Chloride: 105 mmol/L (ref 96–106)
Creatinine, Ser: 0.68 mg/dL (ref 0.57–1.00)
GFR calc Af Amer: 141 mL/min/{1.73_m2} (ref >59)
GFR calc non Af Amer: 122 mL/min/{1.73_m2} (ref >59)
Globulin, Total: 2.2 g/dL (ref 1.5–4.5)
Glucose: 113 mg/dL — ABNORMAL HIGH (ref 65–99)
Potassium: 3.7 mmol/L (ref 3.5–5.2)
Sodium: 136 mmol/L (ref 134–144)
Total Protein: 5.8 g/dL — ABNORMAL LOW (ref 6.0–8.5)

## 2018-10-29 LAB — BILE ACIDS, TOTAL: Bile Acids Total: 4.4 umol/L (ref 0.0–10.0)

## 2018-10-31 ENCOUNTER — Encounter: Payer: Medicaid Other | Admitting: Obstetrics & Gynecology

## 2018-11-04 ENCOUNTER — Other Ambulatory Visit: Payer: Self-pay

## 2018-11-04 ENCOUNTER — Ambulatory Visit (INDEPENDENT_AMBULATORY_CARE_PROVIDER_SITE_OTHER): Payer: Medicaid Other | Admitting: Obstetrics and Gynecology

## 2018-11-04 ENCOUNTER — Encounter: Payer: Self-pay | Admitting: Obstetrics and Gynecology

## 2018-11-04 VITALS — BP 93/63 | HR 72 | Wt 157.9 lb

## 2018-11-04 DIAGNOSIS — Z98891 History of uterine scar from previous surgery: Secondary | ICD-10-CM

## 2018-11-04 DIAGNOSIS — Z3480 Encounter for supervision of other normal pregnancy, unspecified trimester: Secondary | ICD-10-CM

## 2018-11-04 DIAGNOSIS — Z3009 Encounter for other general counseling and advice on contraception: Secondary | ICD-10-CM

## 2018-11-04 DIAGNOSIS — Z3A35 35 weeks gestation of pregnancy: Secondary | ICD-10-CM

## 2018-11-04 DIAGNOSIS — D563 Thalassemia minor: Secondary | ICD-10-CM

## 2018-11-04 DIAGNOSIS — O99113 Other diseases of the blood and blood-forming organs and certain disorders involving the immune mechanism complicating pregnancy, third trimester: Secondary | ICD-10-CM

## 2018-11-04 NOTE — Progress Notes (Addendum)
   PRENATAL VISIT NOTE  Subjective:  Shirley Greene is a 23 y.o. G2P1001 at [redacted]w[redacted]d being seen today for ongoing prenatal care.  She is currently monitored for the following issues for this low-risk pregnancy and has Supervision of other normal pregnancy, antepartum; History of cesarean delivery; Alpha thalassemia silent carrier; Sciatica; and Unwanted fertility on their problem list.  Patient reports occasional contractions. Had light-headed episode today while waiting in line to vote, sat down. Has these occasionally. Contractions: Not present.  .  Movement: Present. Denies leaking of fluid.   The following portions of the patient's history were reviewed and updated as appropriate: allergies, current medications, past family history, past medical history, past social history, past surgical history and problem list.   Objective:   Vitals:   11/04/18 1532  BP: 93/63  Pulse: 72  Weight: 157 lb 14.4 oz (71.6 kg)    Fetal Status: Fetal Heart Rate (bpm): 152 Fundal Height: 35 cm Movement: Present     General:  Alert, oriented and cooperative. Patient is in no acute distress.  Skin: Skin is warm and dry. No rash noted.   Cardiovascular: Normal heart rate noted  Respiratory: Normal respiratory effort, no problems with respiration noted  Abdomen: Soft, gravid, appropriate for gestational age.  Pain/Pressure: Present     Pelvic: Cervical exam deferred        Extremities: Normal range of motion.  Edema: None  Mental Status: Normal mood and affect. Normal behavior. Normal judgment and thought content.   Assessment and Plan:  Pregnancy: G2P1001 at [redacted]w[redacted]d  1. Supervision of other normal pregnancy, antepartum Encouraged increased fluid intake and eating crackers/small snacks to keep her BP up  2. History of cesarean delivery Has RCS scheduled for 12/02/2018  3. Alpha thalassemia silent carrier  4. Unwanted fertility reconsidering BTL, worried about length of time it will add to procedure  and her anxiety Reviewed IUD, she will consider  Preterm labor symptoms and general obstetric precautions including but not limited to vaginal bleeding, contractions, leaking of fluid and fetal movement were reviewed in detail with the patient. Please refer to After Visit Summary for other counseling recommendations.   Return in about 1 week (around 11/11/2018) for 36 week swabs, low OB, in person.  Future Appointments  Date Time Provider [place]  11/13/2018 10:30 AM Darlina Rumpf, [place] [facility]    Sloan Leiter, MD

## 2018-11-13 ENCOUNTER — Other Ambulatory Visit: Payer: Self-pay

## 2018-11-13 ENCOUNTER — Other Ambulatory Visit (HOSPITAL_COMMUNITY)
Admission: RE | Admit: 2018-11-13 | Discharge: 2018-11-13 | Disposition: A | Discharge status: Home / Self Care | Payer: Medicaid Other | Source: Ambulatory Visit | Attending: Advanced Practice Midwife | Admitting: Advanced Practice Midwife

## 2018-11-13 ENCOUNTER — Ambulatory Visit (INDEPENDENT_AMBULATORY_CARE_PROVIDER_SITE_OTHER): Payer: Medicaid Other | Admitting: Advanced Practice Midwife

## 2018-11-13 VITALS — BP 106/73 | HR 101 | Wt 160.0 lb

## 2018-11-13 DIAGNOSIS — Z3480 Encounter for supervision of other normal pregnancy, unspecified trimester: Secondary | ICD-10-CM | POA: Insufficient documentation

## 2018-11-13 DIAGNOSIS — Z3009 Encounter for other general counseling and advice on contraception: Secondary | ICD-10-CM

## 2018-11-13 DIAGNOSIS — Z3A36 36 weeks gestation of pregnancy: Secondary | ICD-10-CM

## 2018-11-13 DIAGNOSIS — Z98891 History of uterine scar from previous surgery: Secondary | ICD-10-CM

## 2018-11-13 DIAGNOSIS — Z3483 Encounter for supervision of other normal pregnancy, third trimester: Secondary | ICD-10-CM

## 2018-11-13 NOTE — Patient Instructions (Addendum)

## 2018-11-13 NOTE — Progress Notes (Signed)
   PRENATAL VISIT NOTE  Subjective:  Shirley Greene is a 23 y.o. G2P1001 at [redacted]w[redacted]d being seen today for ongoing prenatal care.  She is currently monitored for the following issues for this low-risk pregnancy and has Supervision of other normal pregnancy, antepartum; History of cesarean delivery; Alpha thalassemia silent carrier; Sciatica; and Unwanted fertility on their problem list.  Patient reports no complaints.  Contractions: Irregular. Vag. Bleeding: None.  Movement: Present. Denies leaking of fluid.   The following portions of the patient's history were reviewed and updated as appropriate: allergies, current medications, past family history, past medical history, past social history, past surgical history and problem list. Problem list updated.  Objective:   Vitals:   11/13/18 1038  BP: 106/73  Pulse: (!) 101  Weight: 160 lb (72.6 kg)    Fetal Status: Fetal Heart Rate (bpm): 145 Fundal Height: 36 cm Movement: Present     General:  Alert, oriented and cooperative. Patient is in no acute distress.  Skin: Skin is warm and dry. No rash noted.   Cardiovascular: Normal heart rate noted  Respiratory: Normal respiratory effort, no problems with respiration noted  Abdomen: Soft, gravid, appropriate for gestational age.  Pain/Pressure: Present     Pelvic: Cervical exam performed Dilation: Closed Effacement (%): Thick Station: Ballotable  Extremities: Normal range of motion.  Edema: None  Mental Status: Normal mood and affect. Normal behavior. Normal judgment and thought content.   Assessment and Plan:  Pregnancy: G2P1001 at [redacted]w[redacted]d  1. Supervision of other normal pregnancy, antepartum - Continue routine care - GC/Chlamydia probe amp ([place])not at [facility] - Culture, beta strep (group b only)  2. History of cesarean delivery - Elective repeat scheduled for 12/02/18  3. Unwanted fertility - BTL papers signed 09/10/18 - Now requests IUD placement  Preterm labor symptoms and  general obstetric precautions including but not limited to vaginal bleeding, contractions, leaking of fluid and fetal movement were reviewed in detail with the patient. Please refer to After Visit Summary for other counseling recommendations.  Return in about 2 weeks (around 11/27/2018) for in person visit.  Future Appointments  Date Time Provider [place]  11/27/2018 10:30 AM Darlina Rumpf, CNM [facility] [place], [place]

## 2018-11-15 ENCOUNTER — Telehealth: Payer: Self-pay | Admitting: *Deleted

## 2018-11-15 ENCOUNTER — Other Ambulatory Visit: Payer: Self-pay

## 2018-11-15 ENCOUNTER — Ambulatory Visit (INDEPENDENT_AMBULATORY_CARE_PROVIDER_SITE_OTHER): Payer: Medicaid Other | Admitting: *Deleted

## 2018-11-15 VITALS — BP 112/74 | HR 106

## 2018-11-15 DIAGNOSIS — Z3A36 36 weeks gestation of pregnancy: Secondary | ICD-10-CM | POA: Diagnosis not present

## 2018-11-15 DIAGNOSIS — O368130 Decreased fetal movements, third trimester, not applicable or unspecified: Secondary | ICD-10-CM

## 2018-11-15 DIAGNOSIS — Z3480 Encounter for supervision of other normal pregnancy, unspecified trimester: Secondary | ICD-10-CM

## 2018-11-15 LAB — GC/CHLAMYDIA PROBE AMP (~~LOC~~) NOT AT ARMC
Chlamydia: NEGATIVE
Comment: NEGATIVE
Comment: NORMAL
Neisseria Gonorrhea: NEGATIVE

## 2018-11-15 NOTE — Progress Notes (Signed)
Pt came in do to feeling decreased fetal movement. Placed on monitor for NST.   NST reassuring and reactive. Reviewed by Dr Ilda Basset.   Crosby Oyster, RN

## 2018-11-15 NOTE — Telephone Encounter (Signed)
Pt called concerned that she has not felt baby move as much as normal since last night, and did have some vaginal spotting. Pt did have her cervix checked on wednesday, explained that spotting can be normal. Asked pt to come into the office so I can do a NST to check on baby. Will call provider to review once completed.

## 2018-11-17 LAB — CULTURE, BETA STREP (GROUP B ONLY): Strep Gp B Culture: NEGATIVE

## 2018-11-21 ENCOUNTER — Encounter (HOSPITAL_COMMUNITY): Payer: Self-pay

## 2018-11-21 ENCOUNTER — Inpatient Hospital Stay (HOSPITAL_COMMUNITY)
Admission: AD | Admit: 2018-11-21 | Discharge: 2018-11-21 | Disposition: A | Discharge status: Home / Self Care | Payer: Medicaid Other | Attending: Obstetrics and Gynecology | Admitting: Obstetrics and Gynecology

## 2018-11-21 ENCOUNTER — Other Ambulatory Visit: Payer: Self-pay

## 2018-11-21 DIAGNOSIS — Z87410 Personal history of cervical dysplasia: Secondary | ICD-10-CM | POA: Insufficient documentation

## 2018-11-21 DIAGNOSIS — Z87440 Personal history of urinary (tract) infections: Secondary | ICD-10-CM | POA: Insufficient documentation

## 2018-11-21 DIAGNOSIS — Z8249 Family history of ischemic heart disease and other diseases of the circulatory system: Secondary | ICD-10-CM | POA: Diagnosis not present

## 2018-11-21 DIAGNOSIS — Z3A37 37 weeks gestation of pregnancy: Secondary | ICD-10-CM | POA: Diagnosis not present

## 2018-11-21 DIAGNOSIS — N898 Other specified noninflammatory disorders of vagina: Secondary | ICD-10-CM | POA: Insufficient documentation

## 2018-11-21 DIAGNOSIS — O26893 Other specified pregnancy related conditions, third trimester: Secondary | ICD-10-CM | POA: Diagnosis not present

## 2018-11-21 LAB — POCT FERN TEST: POCT Fern Test: NEGATIVE

## 2018-11-21 NOTE — MAU Note (Signed)
I have communicated with marie williams cnm and reviewed vital signs:  Vitals:   11/21/18 2228 11/21/18 2306  BP: 111/66 111/62  Pulse: (!) 106 (!) 105  Resp: 16 16  Temp: 98.6 F (37 C) 98.1 F (36.7 C)    Vaginal exam:  Dilation: Fingertip Effacement (%): 30 Station: Ballotable Exam by:: marie williams cnm,   Also reviewed contraction pattern and that non-stress test is reactive.  It has been documented that patient is contracting every  6-9 minutes, patient reports she was not dilated at recent office appt and since then minimal cervical change not indicating active labor.  Patient is scheduled for C/S in 2 weeks. ?Leaking today-  Hansel Feinstein CNM saw patient with Cervical exam- no pooling noted, Maryann Alar is negative. Pt reports pain level as unchanged.  Based on this report provider has given order for discharge.  A discharge order and diagnosis entered by a provider.   Labor discharge instructions reviewed with patient.

## 2018-11-21 NOTE — MAU Note (Signed)
I have been contracting off and on for several days. Around 1300 was sitting on bed and had gush of fld. After that felt few trickles of fld. About 2140 went to BR and got up and had another gush of fld. May be urine but wanted to be sure. No change in ctxs that have been having over last few days.

## 2018-11-21 NOTE — MAU Provider Note (Signed)
Chief Complaint:  Vaginal Discharge   First Provider Initiated Contact with Patient 11/21/18 2252      HPI: Shirley Greene is a 23 y.o. G2P1001 at 30w3dwho presents to maternity admissions reporting leaking fluid in small amount earlier today and tonight. . She reports good fetal movement, denies vaginal bleeding, vaginal itching/burning, urinary symptoms, h/a, dizziness, n/v, diarrhea, constipation or fever/chills.    RN Note: have been contracting off and on for several days. Around 1300 was sitting on bed and had gush of fld. After that felt few trickles of fld. About 2140 went to BR and got up and had another gush of fld. May be urine but wanted to be sure. No change in ctxs that have been having over last few days.   Past Medical History: Past Medical History:  Diagnosis Date  . Anemia   . Cervical dysplasia 02/15/2017   02/2018: pap neg  . Chlamydia   . Infection    UTI    Past obstetric history: OB History  Gravida Para Term Preterm AB Living  2 1 1     1   SAB TAB Ectopic Multiple Live Births          1    # Outcome Date GA Lbr Len/2nd Weight Sex Delivery Anes PTL Lv  2 Current           1 Term 04/05/12 [redacted]w[redacted]d  3595 g M CS-LTranv EPI  LIV    Obstetric Comments  "OP" presentation, got stuck at 9cm    Past Surgical History: Past Surgical History:  Procedure Laterality Date  . CESAREAN SECTION N/A 04/05/2012   Procedure: CESAREAN SECTION;  Surgeon: Jonnie Kind, MD;  Location: [facility];  Service: Obstetrics;  Laterality: N/A;    Family History: Family History  Problem Relation Age of Onset  . Hypertension Mother   . Hypertension Father   . Cancer Maternal Grandfather     Social History: Social History   Tobacco Use  . Smoking status: Never Smoker  . Smokeless tobacco: Never Used  Substance Use Topics  . Alcohol use: Not Currently    Comment: twice a month  . Drug use: No    Allergies: No Known Allergies  Meds:  Medications Prior to Admission   Medication Sig Dispense Refill Last Dose  . ferrous gluconate (FERGON) 324 MG tablet Take 1 tablet (324 mg total) by mouth daily with breakfast. 60 tablet 1 11/20/2018 at Unknown time  . Prenatal Vit-Fe Fumarate-FA (PRENATAL MULTIVITAMIN) TABS tablet Take 1 tablet by mouth daily at 12 noon.   11/20/2018 at Unknown time  . ferrous sulfate 325 (65 FE) MG EC tablet Take 325 mg by mouth 3 (three) times daily with meals.     . hydrOXYzine (VISTARIL) 50 MG capsule Take 1 capsule (50 mg total) by mouth 3 (three) times daily as needed. (Patient not taking: Reported on 10/28/2018) 90 capsule 2   . miconazole (MONISTAT 7) 2 % vaginal cream Place 1 Applicatorful vaginally at bedtime. Apply for seven nights (Patient not taking: Reported on 10/28/2018) 30 g 2   . polyethylene glycol (MIRALAX) 17 g packet Take 17 g by mouth daily. (Patient not taking: Reported on 10/28/2018) 30 each 1   . promethazine (PHENERGAN) 25 MG tablet Take 1 tablet (25 mg total) by mouth every 6 (six) hours as needed for nausea or vomiting. (Patient not taking: Reported on 11/04/2018) 30 tablet 1     I have reviewed patient's Past Medical Hx, Surgical Hx,  Family Hx, Social Hx, medications and allergies.   ROS:  Review of Systems  Constitutional: Negative for chills and fever.  Respiratory: Negative for shortness of breath.   Gastrointestinal: Negative for constipation, diarrhea and nausea.  Genitourinary: Positive for vaginal discharge. Negative for dysuria and vaginal bleeding.   Other systems negative  Physical Exam   Patient Vitals for the past 24 hrs:  BP Temp Temp src Pulse Resp Height Weight  11/21/18 2228 111/66 98.6 F (37 C) Oral (!) 106 16 - -  11/21/18 2213 111/66 98.5 F (36.9 C) - (!) 104 18 5' (1.524 m) 73.5 kg   Constitutional: Well-developed, well-nourished female in no acute distress.  Cardiovascular: normal rate and rhythm Respiratory: normal effort, clear to auscultation bilaterally GI: Abd soft,  non-tender, gravid appropriate for gestational age.   No rebound or guarding. MS: Extremities nontender, no edema, normal ROM Neurologic: Alert and oriented x 4.  GU: Neg CVAT.  PELVIC EXAM: Cervix pink, visually closed, without lesion, scant white creamy discharge, vaginal walls and external genitalia normal   No Pooling, no ferning   FHT:  Baseline 140 , moderate variability, accelerations present, no decelerations Contractions:  Irregular     Labs: Results for orders placed or performed during the hospital encounter of 11/21/18 (from the past 24 hour(s))  POCT fern test     Status: None   Collection Time: 11/21/18 10:53 PM  Result Value Ref Range   POCT Fern Test Negative = intact amniotic membranes     A/Positive/-- (04/30 0944)  Imaging:  No results found.  MAU Course/MDM: NST reviewed, reactive Reviewed with patient that exam is negative for ruptured membranes.   Treatments in MAU included EFM, SSE.    Assessment: Single intrauterine pregnancy at [redacted]w[redacted]d Vaginal discharge in pregnancy  Plan: Discharge home Labor precautions and fetal kick counts Follow up in Office for prenatal visits and recheck  Encouraged to return here or to other Urgent Care/ED if she develops worsening of symptoms, increase in pain, fever, or other concerning symptoms.   Pt stable at time of discharge.  Wynelle Bourgeois CNM, MSN Certified Nurse-Midwife 11/21/2018 10:52 PM

## 2018-11-21 NOTE — Discharge Instructions (Signed)
Third Trimester of Pregnancy The third trimester is from week 28 through week 40 (months 7 through 9). The third trimester is a time when the unborn baby (fetus) is growing rapidly. At the end of the ninth month, the fetus is about 20 inches in length and weighs 6-10 pounds. Body changes during your third trimester Your body will continue to go through many changes during pregnancy. The changes vary from woman to woman. During the third trimester:  Your weight will continue to increase. You can expect to gain 25-35 pounds (11-16 kg) by the end of the pregnancy.  You may begin to get stretch marks on your hips, abdomen, and breasts.  You may urinate more often because the fetus is moving lower into your pelvis and pressing on your bladder.  You may develop or continue to have heartburn. This is caused by increased hormones that slow down muscles in the digestive tract.  You may develop or continue to have constipation because increased hormones slow digestion and cause the muscles that push waste through your intestines to relax.  You may develop hemorrhoids. These are swollen veins (varicose veins) in the rectum that can itch or be painful.  You may develop swollen, bulging veins (varicose veins) in your legs.  You may have increased body aches in the pelvis, back, or thighs. This is due to weight gain and increased hormones that are relaxing your joints.  You may have changes in your hair. These can include thickening of your hair, rapid growth, and changes in texture. Some women also have hair loss during or after pregnancy, or hair that feels dry or thin. Your hair will most likely return to normal after your baby is born.  Your breasts will continue to grow and they will continue to become tender. A yellow fluid (colostrum) may leak from your breasts. This is the first milk you are producing for your baby.  Your belly button may stick out.  You may notice more swelling in your hands,  face, or ankles.  You may have increased tingling or numbness in your hands, arms, and legs. The skin on your belly may also feel numb.  You may feel short of breath because of your expanding uterus.  You may have more problems sleeping. This can be caused by the size of your belly, increased need to urinate, and an increase in your body's metabolism.  You may notice the fetus "dropping," or moving lower in your abdomen (lightening).  You may have increased vaginal discharge.  You may notice your joints feel loose and you may have pain around your pelvic bone. What to expect at prenatal visits You will have prenatal exams every 2 weeks until week 36. Then you will have weekly prenatal exams. During a routine prenatal visit:  You will be weighed to make sure you and the baby are growing normally.  Your blood pressure will be taken.  Your abdomen will be measured to track your baby's growth.  The fetal heartbeat will be listened to.  Any test results from the previous visit will be discussed.  You may have a cervical check near your due date to see if your cervix has softened or thinned (effaced).  You will be tested for Group B streptococcus. This happens between 35 and 37 weeks. Your health care provider may ask you:  What your birth plan is.  How you are feeling.  If you are feeling the baby move.  If you have had any abnormal   symptoms, such as leaking fluid, bleeding, severe headaches, or abdominal cramping.  If you are using any tobacco products, including cigarettes, chewing tobacco, and electronic cigarettes.  If you have any questions. Other tests or screenings that may be performed during your third trimester include:  Blood tests that check for low iron levels (anemia).  Fetal testing to check the health, activity level, and growth of the fetus. Testing is done if you have certain medical conditions or if there are problems during the pregnancy.  Nonstress test  (NST). This test checks the health of your baby to make sure there are no signs of problems, such as the baby not getting enough oxygen. During this test, a belt is placed around your belly. The baby is made to move, and its heart rate is monitored during movement. What is false labor? False labor is a condition in which you feel small, irregular tightenings of the muscles in the womb (contractions) that usually go away with rest, changing position, or drinking water. These are called Braxton Hicks contractions. Contractions may last for hours, days, or even weeks before true labor sets in. If contractions come at regular intervals, become more frequent, increase in intensity, or become painful, you should see your health care provider. What are the signs of labor?  Abdominal cramps.  Regular contractions that start at 10 minutes apart and become stronger and more frequent with time.  Contractions that start on the top of the uterus and spread down to the lower abdomen and back.  Increased pelvic pressure and dull back pain.  A watery or bloody mucus discharge that comes from the vagina.  Leaking of amniotic fluid. This is also known as your "water breaking." It could be a slow trickle or a gush. Let your health care provider know if it has a color or strange odor. If you have any of these signs, call your health care provider right away, even if it is before your due date. Follow these instructions at home: Medicines  Follow your health care provider's instructions regarding medicine use. Specific medicines may be either safe or unsafe to take during pregnancy.  Take a prenatal vitamin that contains at least 600 micrograms (mcg) of folic acid.  If you develop constipation, try taking a stool softener if your health care provider approves. Eating and drinking   Eat a balanced diet that includes fresh fruits and vegetables, whole grains, good sources of protein such as meat, eggs, or tofu,  and low-fat dairy. Your health care provider will help you determine the amount of weight gain that is right for you.  Avoid raw meat and uncooked cheese. These carry germs that can cause birth defects in the baby.  If you have low calcium intake from food, talk to your health care provider about whether you should take a daily calcium supplement.  Eat four or five small meals rather than three large meals a day.  Limit foods that are high in fat and processed sugars, such as fried and sweet foods.  To prevent constipation: ? Drink enough fluid to keep your urine clear or pale yellow. ? Eat foods that are high in fiber, such as fresh fruits and vegetables, whole grains, and beans. Activity  Exercise only as directed by your health care provider. Most women can continue their usual exercise routine during pregnancy. Try to exercise for 30 minutes at least 5 days a week. Stop exercising if you experience uterine contractions.  Avoid heavy lifting.  Do   not exercise in extreme heat or humidity, or at high altitudes.  Wear low-heel, comfortable shoes.  Practice good posture.  You may continue to have sex unless your health care provider tells you otherwise. Relieving pain and discomfort  Take frequent breaks and rest with your legs elevated if you have leg cramps or low back pain.  Take warm sitz baths to soothe any pain or discomfort caused by hemorrhoids. Use hemorrhoid cream if your health care provider approves.  Wear a good support bra to prevent discomfort from breast tenderness.  If you develop varicose veins: ? Wear support pantyhose or compression stockings as told by your healthcare provider. ? Elevate your feet for 15 minutes, 3-4 times a day. Prenatal care  Write down your questions. Take them to your prenatal visits.  Keep all your prenatal visits as told by your health care provider. This is important. Safety  Wear your seat belt at all times when driving.  Make  a list of emergency phone numbers, including numbers for family, friends, the hospital, and police and fire departments. General instructions  Avoid cat litter boxes and soil used by cats. These carry germs that can cause birth defects in the baby. If you have a cat, ask someone to clean the litter box for you.  Do not travel far distances unless it is absolutely necessary and only with the approval of your health care provider.  Do not use hot tubs, steam rooms, or saunas.  Do not drink alcohol.  Do not use any products that contain nicotine or tobacco, such as cigarettes and e-cigarettes. If you need help quitting, ask your health care provider.  Do not use any medicinal herbs or unprescribed drugs. These chemicals affect the formation and growth of the baby.  Do not douche or use tampons or scented sanitary pads.  Do not cross your legs for long periods of time.  To prepare for the arrival of your baby: ? Take prenatal classes to understand, practice, and ask questions about labor and delivery. ? Make a trial run to the hospital. ? Visit the hospital and tour the maternity area. ? Arrange for maternity or paternity leave through employers. ? Arrange for family and friends to take care of pets while you are in the hospital. ? Purchase a rear-facing car seat and make sure you know how to install it in your car. ? Pack your hospital bag. ? Prepare the baby's nursery. Make sure to remove all pillows and stuffed animals from the baby's crib to prevent suffocation.  Visit your dentist if you have not gone during your pregnancy. Use a soft toothbrush to brush your teeth and be gentle when you floss. Contact a health care provider if:  You are unsure if you are in labor or if your water has broken.  You become dizzy.  You have mild pelvic cramps, pelvic pressure, or nagging pain in your abdominal area.  You have lower back pain.  You have persistent nausea, vomiting, or diarrhea.   You have an unusual or bad smelling vaginal discharge.  You have pain when you urinate. Get help right away if:  Your water breaks before 37 weeks.  You have regular contractions less than 5 minutes apart before 37 weeks.  You have a fever.  You are leaking fluid from your vagina.  You have spotting or bleeding from your vagina.  You have severe abdominal pain or cramping.  You have rapid weight loss or weight gain.  You have   shortness of breath with chest pain.  You notice sudden or extreme swelling of your face, hands, ankles, feet, or legs.  Your baby makes fewer than 10 movements in 2 hours.  You have severe headaches that do not go away when you take medicine.  You have vision changes. Summary  The third trimester is from week 28 through week 40, months 7 through 9. The third trimester is a time when the unborn baby (fetus) is growing rapidly.  During the third trimester, your discomfort may increase as you and your baby continue to gain weight. You may have abdominal, leg, and back pain, sleeping problems, and an increased need to urinate.  During the third trimester your breasts will keep growing and they will continue to become tender. A yellow fluid (colostrum) may leak from your breasts. This is the first milk you are producing for your baby.  False labor is a condition in which you feel small, irregular tightenings of the muscles in the womb (contractions) that eventually go away. These are called Braxton Hicks contractions. Contractions may last for hours, days, or even weeks before true labor sets in.  Signs of labor can include: abdominal cramps; regular contractions that start at 10 minutes apart and become stronger and more frequent with time; watery or bloody mucus discharge that comes from the vagina; increased pelvic pressure and dull back pain; and leaking of amniotic fluid. This information is not intended to replace advice given to you by your health  care provider. Make sure you discuss any questions you have with your health care provider. Document Released: 12/27/2000 Document Revised: 04/25/2018 Document Reviewed: 02/08/2016 [employer]vier Patient Education  2020 [employer]  

## 2018-11-26 ENCOUNTER — Encounter (HOSPITAL_COMMUNITY): Payer: Self-pay

## 2018-11-26 NOTE — Patient Instructions (Signed)
Shirley Greene  11/26/2018   Your procedure is scheduled on:  12/02/2018  Arrive at 25 at Entrance C on [address] [facility]  and [employer]. You are invited to use the FREE valet parking or use the Visitor's parking deck.  Pick up the phone at the desk and dial [phone].  Call this number if you have problems the morning of surgery: [phone]  Remember:   Do not eat food:(After Midnight) Desps de medianoche.  Do not drink clear liquids: (After Midnight) Desps de medianoche.  Take these medicines the morning of surgery with A SIP OF WATER:  none   Do not wear jewelry, make-up or nail polish.  Do not wear lotions, powders, or perfumes. Do not wear deodorant.  Do not shave 48 hours prior to surgery.  Do not bring valuables to the hospital.  [facility] is not   responsible for any belongings or valuables brought to the hospital.  Contacts, dentures or bridgework may not be worn into surgery.  Leave suitcase in the car. After surgery it may be brought to your room.  For patients admitted to the hospital, checkout time is 11:00 AM the day of              discharge.      Please read over the following fact sheets that you were given:     Preparing for Surgery

## 2018-11-27 ENCOUNTER — Other Ambulatory Visit: Payer: Self-pay

## 2018-11-27 ENCOUNTER — Ambulatory Visit (INDEPENDENT_AMBULATORY_CARE_PROVIDER_SITE_OTHER): Payer: Medicaid Other | Admitting: Advanced Practice Midwife

## 2018-11-27 VITALS — BP 104/74 | HR 90 | Wt 160.4 lb

## 2018-11-27 DIAGNOSIS — Z3483 Encounter for supervision of other normal pregnancy, third trimester: Secondary | ICD-10-CM

## 2018-11-27 DIAGNOSIS — Z3480 Encounter for supervision of other normal pregnancy, unspecified trimester: Secondary | ICD-10-CM

## 2018-11-27 DIAGNOSIS — Z98891 History of uterine scar from previous surgery: Secondary | ICD-10-CM

## 2018-11-27 DIAGNOSIS — Z3A38 38 weeks gestation of pregnancy: Secondary | ICD-10-CM

## 2018-11-27 NOTE — Progress Notes (Signed)
   PRENATAL VISIT NOTE  Subjective:  Shirley Greene is a 23 y.o. G2P1001 at [redacted]w[redacted]d being seen today for ongoing prenatal care.  She is currently monitored for the following issues for this low-risk pregnancy and has Supervision of other normal pregnancy, antepartum; History of cesarean delivery; Alpha thalassemia silent carrier; Sciatica; and Unwanted fertility on their problem list.  Patient reports no complaints.  Contractions: Irritability.  .  Movement: Present. Denies leaking of fluid.   The following portions of the patient's history were reviewed and updated as appropriate: allergies, current medications, past family history, past medical history, past social history, past surgical history and problem list. Problem list updated.  Objective:   Vitals:   11/27/18 1038  BP: 104/74  Pulse: 90  Weight: 160 lb 6.4 oz (72.8 kg)    Fetal Status: Fetal Heart Rate (bpm): 135 Fundal Height: 39 cm Movement: Present  Presentation: Vertex  General:  Alert, oriented and cooperative. Patient is in no acute distress.  Skin: Skin is warm and dry. No rash noted.   Cardiovascular: Normal heart rate noted  Respiratory: Normal respiratory effort, no problems with respiration noted  Abdomen: Soft, gravid, appropriate for gestational age.  Pain/Pressure: Absent     Pelvic: Cervical exam deferred        Extremities: Normal range of motion.  Edema: None  Mental Status: Normal mood and affect. Normal behavior. Normal judgment and thought content.   Assessment and Plan:  Pregnancy: G2P1001 at [redacted]w[redacted]d  1. Supervision of other normal pregnancy, antepartum - No concerning findings  2. History of cesarean delivery - Elective repeat scheduled for 12/02/18 - Primary cesarean for OP presentation at 9 cm in 2014. Discussed difference between unscheduled section in labor and scheduled low risk repeat. Reassurance provided  Term labor symptoms and general obstetric precautions including but not limited to  vaginal bleeding, contractions, leaking of fluid and fetal movement were reviewed in detail with the patient. Please refer to After Visit Summary for other counseling recommendations.  No follow-ups on file.  Future Appointments  Date Time Provider [place]  11/30/2018  8:40 AM [facility] 1 [facility] None    Darlina Rumpf, [place]

## 2018-11-27 NOTE — Patient Instructions (Signed)
Postpartum Care After Cesarean Delivery °This sheet gives you information about how to care for yourself from the time you deliver your baby to up to 6-12 weeks after delivery (postpartum period). Your health care provider may also give you more specific instructions. If you have problems or questions, contact your health care provider. °Follow these instructions at home: °Medicines °· Take over-the-counter and prescription medicines only as told by your health care provider. °· If you were prescribed an antibiotic medicine, take it as told by your health care provider. Do not stop taking the antibiotic even if you start to feel better. °· Ask your health care provider if the medicine prescribed to you: °? Requires you to avoid driving or using heavy machinery. °? Can cause constipation. You may need to take actions to prevent or treat constipation, such as: °§ Drink enough fluid to keep your urine pale yellow. °§ Take over-the-counter or prescription medicines. °§ Eat foods that are high in fiber, such as beans, whole grains, and fresh fruits and vegetables. °§ Limit foods that are high in fat and processed sugars, such as fried or sweet foods. °Activity °· Gradually return to your normal activities as told by your health care provider. °· Avoid activities that take a lot of effort and energy (are strenuous) until approved by your health care provider. Walking at a slow to moderate pace is usually safe. Ask your health care provider what activities are safe for you. °? Do not lift anything that is heavier than your baby or 10 lb (4.5 kg) as told by your health care provider. °? Do not vacuum, climb stairs, or drive a car for as long as told by your health care provider. °· If possible, have someone help you at home until you are able to do your usual activities yourself. °· Rest as much as possible. Try to rest or take naps while your baby is sleeping. °Vaginal bleeding °· It is normal to have vaginal bleeding  (lochia) after delivery. Wear a sanitary pad to absorb vaginal bleeding and discharge. °? During the first week after delivery, the amount and appearance of lochia is often similar to a menstrual period. °? Over the next few weeks, it will gradually decrease to a dry, yellow-brown discharge. °? For most women, lochia stops completely by 4-6 weeks after delivery. Vaginal bleeding can vary from woman to woman. °· Change your sanitary pads frequently. Watch for any changes in your flow, such as: °? A sudden increase in volume. °? A change in color. °? Large blood clots. °· If you pass a blood clot, save it and call your health care provider to discuss. Do not flush blood clots down the toilet before you get instructions from your health care provider. °· Do not use tampons or douches until your health care provider says this is safe. °· If you are not breastfeeding, your period should return 6-8 weeks after delivery. If you are breastfeeding, your period may return anytime between 8 weeks after delivery and the time that you stop breastfeeding. °Perineal care ° °· If your C-section (Cesarean section) was unplanned, and you were allowed to labor and push before delivery, you may have pain, swelling, and discomfort of the tissue between your vaginal opening and your anus (perineum). You may also have an incision in the tissue (episiotomy) or the tissue may have torn during delivery. Follow these instructions as told by your health care provider: °? Keep your perineum clean and dry as told by   your health care provider. Use medicated pads and pain-relieving sprays and creams as directed. °? If you have an episiotomy or vaginal tear, check the area every day for signs of infection. Check for: °§ Redness, swelling, or pain. °§ Fluid or blood. °§ Warmth. °§ Pus or a bad smell. °? You may be given a squirt bottle to use instead of wiping to clean the perineum area after you go to the bathroom. As you start healing, you may use  the squirt bottle before wiping yourself. Make sure to wipe gently. °? To relieve pain caused by an episiotomy, vaginal tear, or hemorrhoids, try taking a warm sitz bath 2-3 times a day. A sitz bath is a warm water bath that is taken while you are sitting down. The water should only come up to your hips and should cover your buttocks. °Breast care °· Within the first few days after delivery, your breasts may feel heavy, full, and uncomfortable (breast engorgement). You may also have milk leaking from your breasts. Your health care provider can suggest ways to help relieve breast discomfort. Breast engorgement should go away within a few days. °· If you are breastfeeding: °? Wear a bra that supports your breasts and fits you well. °? Keep your nipples clean and dry. Apply creams and ointments as told by your health care provider. °? You may need to use breast pads to absorb milk leakage. °? You may have uterine contractions every time you breastfeed for several weeks after delivery. Uterine contractions help your uterus return to its normal size. °? If you have any problems with breastfeeding, work with your health care provider or a lactation consultant. °· If you are not breastfeeding: °? Avoid touching your breasts as this can make your breasts produce more milk. °? Wear a well-fitting bra and use cold packs to help with swelling. °? Do not squeeze out (express) milk. This causes you to make more milk. °Intimacy and sexuality °· Ask your health care provider when you can engage in sexual activity. This may depend on your: °? Risk of infection. °? Healing rate. °? Comfort and desire to engage in sexual activity. °· You are able to get pregnant after delivery, even if you have not had your period. If desired, talk with your health care provider about methods of family planning or birth control (contraception). °Lifestyle °· Do not use any products that contain nicotine or tobacco, such as cigarettes, e-cigarettes,  and chewing tobacco. If you need help quitting, ask your health care provider. °· Do not drink alcohol, especially if you are breastfeeding. °Eating and drinking ° °· Drink enough fluid to keep your urine pale yellow. °· Eat high-fiber foods every day. These may help prevent or relieve constipation. High-fiber foods include: °? Whole grain cereals and breads. °? Brown rice. °? Beans. °? Fresh fruits and vegetables. °· Take your prenatal vitamins until your postpartum checkup or until your health care provider tells you it is okay to stop. °General instructions °· Keep all follow-up visits for you and your baby as told by your health care provider. Most women visit their health care provider for a postpartum checkup within the first 3-6 weeks after delivery. °Contact a health care provider if you: °· Feel unable to cope with the changes that a new baby brings to your life, and these feelings do not go away. °· Feel unusually sad or worried. °· Have breasts that are painful, hard, or turn red. °· Have a fever. °·   Have trouble holding urine or keeping urine from leaking. °· Have little or no interest in activities you used to enjoy. °· Have not breastfed at all and you have not had a menstrual period for 12 weeks after delivery. °· Have stopped breastfeeding and you have not had a menstrual period for 12 weeks after you stopped breastfeeding. °· Have questions about caring for yourself or your baby. °· Pass a blood clot from your vagina. °Get help right away if you: °· Have chest pain. °· Have difficulty breathing. °· Have sudden, severe leg pain. °· Have severe pain or cramping in your abdomen. °· Bleed from your vagina so much that you fill more than one sanitary pad in one hour. Bleeding should not be heavier than your heaviest period. °· Develop a severe headache. °· Faint. °· Have blurred vision or spots in your vision. °· Have a bad-smelling vaginal discharge. °· Have thoughts about hurting yourself or your  baby. °If you ever feel like you may hurt yourself or others, or have thoughts about taking your own life, get help right away. You can go to your nearest emergency department or call: °· Your local emergency services (911 in the [place] °· A suicide crisis helpline, such as the National Suicide Prevention Lifeline at 1-800-273-8255. This is open 24 hours a day. °Summary °· The period of time from when you deliver your baby to up to 6-12 weeks after delivery is called the postpartum period. °· Gradually return to your normal activities as told by your health care provider. °· Keep all follow-up visits for you and your baby as told by your health care provider. °This information is not intended to replace advice given to you by your health care provider. Make sure you discuss any questions you have with your health care provider. °Document Released: 12/31/1999 Document Revised: 08/22/2017 Document Reviewed: 08/22/2017 °Elsevier Patient Education © 2020 Elsevier Inc. ° °

## 2018-11-28 ENCOUNTER — Inpatient Hospital Stay (HOSPITAL_COMMUNITY)
Admission: AD | Admit: 2018-11-28 | Discharge: 2018-11-30 | DRG: 787 | Disposition: A | Discharge status: Home / Self Care | Payer: Medicaid Other | Attending: Obstetrics and Gynecology | Admitting: Obstetrics and Gynecology

## 2018-11-28 ENCOUNTER — Encounter (HOSPITAL_COMMUNITY)
Admission: AD | Disposition: A | Discharge status: Home / Self Care | Payer: Self-pay | Source: Home / Self Care | Attending: Obstetrics and Gynecology

## 2018-11-28 ENCOUNTER — Inpatient Hospital Stay (HOSPITAL_COMMUNITY)
Admission: AD | Admit: 2018-11-28 | Payer: Medicaid Other | Source: Home / Self Care | Admitting: Obstetrics and Gynecology

## 2018-11-28 ENCOUNTER — Inpatient Hospital Stay (HOSPITAL_COMMUNITY): Payer: Medicaid Other | Admitting: Anesthesiology

## 2018-11-28 ENCOUNTER — Encounter (HOSPITAL_COMMUNITY): Payer: Self-pay | Admitting: *Deleted

## 2018-11-28 ENCOUNTER — Other Ambulatory Visit: Payer: Self-pay

## 2018-11-28 DIAGNOSIS — O34219 Maternal care for unspecified type scar from previous cesarean delivery: Secondary | ICD-10-CM

## 2018-11-28 DIAGNOSIS — Z3A38 38 weeks gestation of pregnancy: Secondary | ICD-10-CM | POA: Diagnosis not present

## 2018-11-28 DIAGNOSIS — Z98891 History of uterine scar from previous surgery: Secondary | ICD-10-CM

## 2018-11-28 DIAGNOSIS — O34211 Maternal care for low transverse scar from previous cesarean delivery: Principal | ICD-10-CM | POA: Diagnosis present

## 2018-11-28 DIAGNOSIS — D563 Thalassemia minor: Secondary | ICD-10-CM | POA: Diagnosis present

## 2018-11-28 DIAGNOSIS — O9081 Anemia of the puerperium: Secondary | ICD-10-CM | POA: Diagnosis not present

## 2018-11-28 DIAGNOSIS — O26893 Other specified pregnancy related conditions, third trimester: Secondary | ICD-10-CM | POA: Diagnosis present

## 2018-11-28 DIAGNOSIS — D62 Acute posthemorrhagic anemia: Secondary | ICD-10-CM | POA: Diagnosis not present

## 2018-11-28 DIAGNOSIS — Z20828 Contact with and (suspected) exposure to other viral communicable diseases: Secondary | ICD-10-CM | POA: Diagnosis present

## 2018-11-28 HISTORY — DX: History of uterine scar from previous surgery: Z98.891

## 2018-11-28 LAB — CBC
HCT: 34.3 % — ABNORMAL LOW (ref 36.0–46.0)
Hemoglobin: 10.4 g/dL — ABNORMAL LOW (ref 12.0–15.0)
MCH: 22.7 pg — ABNORMAL LOW (ref 26.0–34.0)
MCHC: 30.3 g/dL (ref 30.0–36.0)
MCV: 74.9 fL — ABNORMAL LOW (ref 80.0–100.0)
Platelets: 297 10*3/uL (ref 150–400)
RBC: 4.58 MIL/uL (ref 3.87–5.11)
RDW: 17.4 % — ABNORMAL HIGH (ref 11.5–15.5)
WBC: 9.7 10*3/uL (ref 4.0–10.5)
nRBC: 0.2 % (ref 0.0–0.2)

## 2018-11-28 LAB — TYPE AND SCREEN
ABO/RH(D): A POS
Antibody Screen: NEGATIVE

## 2018-11-28 LAB — SARS CORONAVIRUS 2 BY RT PCR (HOSPITAL ORDER, PERFORMED IN ~~LOC~~ HOSPITAL LAB): SARS Coronavirus 2: NEGATIVE

## 2018-11-28 LAB — RPR: RPR Ser Ql: NONREACTIVE

## 2018-11-28 LAB — COMPREHENSIVE METABOLIC PANEL
ALT: 15 U/L (ref 0–44)
AST: 33 U/L (ref 15–41)
Albumin: 3 g/dL — ABNORMAL LOW (ref 3.5–5.0)
Alkaline Phosphatase: 142 U/L — ABNORMAL HIGH (ref 38–126)
Anion gap: 12 (ref 5–15)
BUN: 5 mg/dL — ABNORMAL LOW (ref 6–20)
CO2: 19 mmol/L — ABNORMAL LOW (ref 22–32)
Calcium: 9 mg/dL (ref 8.9–10.3)
Chloride: 105 mmol/L (ref 98–111)
Creatinine, Ser: 0.73 mg/dL (ref 0.44–1.00)
GFR calc Af Amer: >60 mL/min (ref >60)
GFR calc non Af Amer: >60 mL/min (ref >60)
Glucose, Bld: 95 mg/dL (ref 70–99)
Potassium: 4 mmol/L (ref 3.5–5.1)
Sodium: 136 mmol/L (ref 135–145)
Total Bilirubin: 0.9 mg/dL (ref 0.3–1.2)
Total Protein: 6.3 g/dL — ABNORMAL LOW (ref 6.5–8.1)

## 2018-11-28 LAB — ABO/RH: ABO/RH(D): A POS

## 2018-11-28 LAB — POCT FERN TEST: POCT Fern Test: POSITIVE

## 2018-11-28 LAB — RAPID HIV SCREEN (HIV 1/2 AB+AG)
HIV 1/2 Antibodies: NONREACTIVE
HIV-1 P24 Antigen - HIV24: NONREACTIVE

## 2018-11-28 SURGERY — Surgical Case
Anesthesia: Spinal | Site: Abdomen | Wound class: Clean Contaminated

## 2018-11-28 MED ORDER — WITCH HAZEL-GLYCERIN EX PADS: 1.0000 "application " | MEDICATED_PAD | CUTANEOUS | Status: DC | PRN

## 2018-11-28 MED ORDER — PHENYLEPHRINE HCL-NACL 20-0.9 MG/250ML-% IV SOLN
INTRAVENOUS | Status: AC
  Filled 2018-11-28: qty 250

## 2018-11-28 MED ORDER — NALBUPHINE HCL 10 MG/ML IJ SOLN
5.0000 mg | Freq: Once | INTRAMUSCULAR | Status: DC | PRN
  Filled 2018-11-28: qty 0.5

## 2018-11-28 MED ORDER — TETANUS-DIPHTH-ACELL PERTUSSIS 5-2.5-18.5 LF-MCG/0.5 IM SUSP: 0.5000 mL | Freq: Once | INTRAMUSCULAR | Status: DC

## 2018-11-28 MED ORDER — SIMETHICONE 80 MG PO CHEW
80.0000 mg | CHEWABLE_TABLET | Freq: Three times a day (TID) | ORAL | Status: DC
  Administered 2018-11-28 – 2018-11-30 (×5): 80 mg via ORAL
  Filled 2018-11-28 (×5): qty 1

## 2018-11-28 MED ORDER — NALBUPHINE HCL 10 MG/ML IJ SOLN
5.0000 mg | INTRAMUSCULAR | Status: DC | PRN
  Filled 2018-11-28: qty 0.5

## 2018-11-28 MED ORDER — SODIUM CHLORIDE 0.9 % IV SOLN
500.0000 mg | Freq: Once | INTRAVENOUS | Status: AC
  Administered 2018-11-28: 10:00:00 500 mg via INTRAVENOUS
  Filled 2018-11-28: qty 500

## 2018-11-28 MED ORDER — DIPHENHYDRAMINE HCL 50 MG/ML IJ SOLN: 12.5000 mg | INTRAMUSCULAR | Status: DC | PRN

## 2018-11-28 MED ORDER — COCONUT OIL OIL
1.0000 "application " | TOPICAL_OIL | Status: DC | PRN
  Administered 2018-11-29: 1 via TOPICAL

## 2018-11-28 MED ORDER — ONDANSETRON HCL 4 MG/2ML IJ SOLN
4.0000 mg | Freq: Four times a day (QID) | INTRAMUSCULAR | Status: DC | PRN
  Administered 2018-11-28: 11:00:00 4 mg via INTRAVENOUS
  Filled 2018-11-28: qty 2

## 2018-11-28 MED ORDER — LACTATED RINGERS IV SOLN
INTRAVENOUS | Status: DC
  Administered 2018-11-28 (×2): via INTRAVENOUS

## 2018-11-28 MED ORDER — PRENATAL MULTIVITAMIN CH
1.0000 | ORAL_TABLET | Freq: Every day | ORAL | Status: DC
  Administered 2018-11-29: 12:00:00 1 via ORAL
  Filled 2018-11-28 (×2): qty 1

## 2018-11-28 MED ORDER — SODIUM CHLORIDE 0.9% FLUSH: 3.0000 mL | INTRAVENOUS | Status: DC | PRN

## 2018-11-28 MED ORDER — ENOXAPARIN SODIUM 40 MG/0.4ML ~~LOC~~ SOLN
40.0000 mg | SUBCUTANEOUS | Status: DC
  Administered 2018-11-29 – 2018-11-30 (×2): 40 mg via SUBCUTANEOUS
  Filled 2018-11-28 (×2): qty 0.4

## 2018-11-28 MED ORDER — ONDANSETRON HCL 4 MG/2ML IJ SOLN: 4.0000 mg | Freq: Once | INTRAMUSCULAR | Status: DC | PRN

## 2018-11-28 MED ORDER — FENTANYL CITRATE (PF) 100 MCG/2ML IJ SOLN
INTRAMUSCULAR | Status: AC
  Filled 2018-11-28: qty 2

## 2018-11-28 MED ORDER — ZOLPIDEM TARTRATE 5 MG PO TABS: 5.0000 mg | ORAL_TABLET | Freq: Every evening | ORAL | Status: DC | PRN

## 2018-11-28 MED ORDER — MENTHOL 3 MG MT LOZG: 1.0000 | LOZENGE | OROMUCOSAL | Status: DC | PRN

## 2018-11-28 MED ORDER — KETOROLAC TROMETHAMINE 30 MG/ML IJ SOLN
INTRAMUSCULAR | Status: AC
  Filled 2018-11-28: qty 1

## 2018-11-28 MED ORDER — NALOXONE HCL 0.4 MG/ML IJ SOLN: 0.4000 mg | INTRAMUSCULAR | Status: DC | PRN

## 2018-11-28 MED ORDER — ONDANSETRON HCL 4 MG/2ML IJ SOLN
INTRAMUSCULAR | Status: AC
  Filled 2018-11-28: qty 2

## 2018-11-28 MED ORDER — KETOROLAC TROMETHAMINE 30 MG/ML IJ SOLN
30.0000 mg | Freq: Once | INTRAMUSCULAR | Status: AC | PRN
  Administered 2018-11-28: 30 mg via INTRAVENOUS

## 2018-11-28 MED ORDER — CEFAZOLIN SODIUM-DEXTROSE 2-3 GM-%(50ML) IV SOLR
INTRAVENOUS | Status: DC | PRN
  Administered 2018-11-28: 2 g via INTRAVENOUS

## 2018-11-28 MED ORDER — DIPHENHYDRAMINE HCL 50 MG/ML IJ SOLN
25.0000 mg | Freq: Four times a day (QID) | INTRAMUSCULAR | Status: DC | PRN
  Administered 2018-11-28: 25 mg via INTRAMUSCULAR
  Filled 2018-11-28: qty 1

## 2018-11-28 MED ORDER — SIMETHICONE 80 MG PO CHEW
80.0000 mg | CHEWABLE_TABLET | ORAL | Status: DC
  Administered 2018-11-28 – 2018-11-29 (×2): 80 mg via ORAL
  Filled 2018-11-28 (×2): qty 1

## 2018-11-28 MED ORDER — OXYCODONE HCL 5 MG/5ML PO SOLN: 5.0000 mg | Freq: Once | ORAL | Status: DC | PRN

## 2018-11-28 MED ORDER — DEXAMETHASONE SODIUM PHOSPHATE 4 MG/ML IJ SOLN
INTRAMUSCULAR | Status: DC | PRN
  Administered 2018-11-28: 5 mg via INTRAVENOUS

## 2018-11-28 MED ORDER — FENTANYL CITRATE (PF) 100 MCG/2ML IJ SOLN
INTRAMUSCULAR | Status: DC | PRN
  Administered 2018-11-28: 15 ug via INTRATHECAL

## 2018-11-28 MED ORDER — LACTATED RINGERS IV SOLN: INTRAVENOUS | Status: DC

## 2018-11-28 MED ORDER — SOD CITRATE-CITRIC ACID 500-334 MG/5ML PO SOLN
30.0000 mL | ORAL | Status: AC
  Administered 2018-11-28: 12:00:00 30 mL via ORAL
  Filled 2018-11-28: qty 30

## 2018-11-28 MED ORDER — SCOPOLAMINE 1 MG/3DAYS TD PT72
1.0000 | MEDICATED_PATCH | Freq: Once | TRANSDERMAL | Status: DC
  Administered 2018-11-28: 1.5 mg via TRANSDERMAL

## 2018-11-28 MED ORDER — HYDROMORPHONE HCL 1 MG/ML IJ SOLN: 0.2000 mg | INTRAMUSCULAR | Status: DC | PRN

## 2018-11-28 MED ORDER — OXYCODONE HCL 5 MG PO TABS: 5.0000 mg | ORAL_TABLET | ORAL | Status: DC | PRN

## 2018-11-28 MED ORDER — FAMOTIDINE IN NACL 20-0.9 MG/50ML-% IV SOLN
20.0000 mg | Freq: Once | INTRAVENOUS | Status: AC
  Administered 2018-11-28: 20 mg via INTRAVENOUS
  Filled 2018-11-28: qty 50

## 2018-11-28 MED ORDER — NALOXONE HCL 4 MG/10ML IJ SOLN
1.0000 ug/kg/h | INTRAVENOUS | Status: DC | PRN
  Filled 2018-11-28: qty 5

## 2018-11-28 MED ORDER — IBUPROFEN 800 MG PO TABS
800.0000 mg | ORAL_TABLET | Freq: Four times a day (QID) | ORAL | Status: DC
  Administered 2018-11-29 – 2018-11-30 (×5): 800 mg via ORAL
  Filled 2018-11-28 (×5): qty 1

## 2018-11-28 MED ORDER — ONDANSETRON HCL 4 MG/2ML IJ SOLN: 4.0000 mg | Freq: Three times a day (TID) | INTRAMUSCULAR | Status: DC | PRN

## 2018-11-28 MED ORDER — SIMETHICONE 80 MG PO CHEW: 80.0000 mg | CHEWABLE_TABLET | ORAL | Status: DC | PRN

## 2018-11-28 MED ORDER — OXYTOCIN 40 UNITS IN NORMAL SALINE INFUSION - SIMPLE MED
INTRAVENOUS | Status: AC
  Filled 2018-11-28: qty 1000

## 2018-11-28 MED ORDER — DIBUCAINE (PERIANAL) 1 % EX OINT: 1.0000 "application " | TOPICAL_OINTMENT | CUTANEOUS | Status: DC | PRN

## 2018-11-28 MED ORDER — STERILE WATER FOR IRRIGATION IR SOLN
Status: DC | PRN
  Administered 2018-11-28: 1000 mL

## 2018-11-28 MED ORDER — ONDANSETRON HCL 4 MG/2ML IJ SOLN
INTRAMUSCULAR | Status: DC | PRN
  Administered 2018-11-28: 4 mg via INTRAVENOUS

## 2018-11-28 MED ORDER — DIPHENHYDRAMINE HCL 25 MG PO CAPS: 25.0000 mg | ORAL_CAPSULE | Freq: Four times a day (QID) | ORAL | Status: DC | PRN

## 2018-11-28 MED ORDER — KETOROLAC TROMETHAMINE 30 MG/ML IJ SOLN
30.0000 mg | Freq: Four times a day (QID) | INTRAMUSCULAR | Status: AC
  Administered 2018-11-28 – 2018-11-29 (×3): 30 mg via INTRAVENOUS
  Filled 2018-11-28 (×3): qty 1

## 2018-11-28 MED ORDER — SODIUM CHLORIDE 0.9 % IR SOLN
Status: DC | PRN
  Administered 2018-11-28: 1000 mL

## 2018-11-28 MED ORDER — SENNOSIDES-DOCUSATE SODIUM 8.6-50 MG PO TABS
2.0000 | ORAL_TABLET | ORAL | Status: DC
  Administered 2018-11-28 – 2018-11-29 (×2): 2 via ORAL
  Filled 2018-11-28 (×2): qty 2

## 2018-11-28 MED ORDER — CEFAZOLIN SODIUM-DEXTROSE 2-4 GM/100ML-% IV SOLN
2.0000 g | INTRAVENOUS | Status: DC
  Filled 2018-11-28: qty 100

## 2018-11-28 MED ORDER — ACETAMINOPHEN 500 MG PO TABS
1000.0000 mg | ORAL_TABLET | Freq: Four times a day (QID) | ORAL | Status: DC
  Administered 2018-11-28 – 2018-11-30 (×7): 1000 mg via ORAL
  Filled 2018-11-28 (×8): qty 2

## 2018-11-28 MED ORDER — OXYTOCIN 40 UNITS IN NORMAL SALINE INFUSION - SIMPLE MED
INTRAVENOUS | Status: DC | PRN
  Administered 2018-11-28 (×2): 150 mL via INTRAVENOUS
  Administered 2018-11-28 (×2): 100 mL via INTRAVENOUS

## 2018-11-28 MED ORDER — OXYCODONE HCL 5 MG PO TABS: 5.0000 mg | ORAL_TABLET | Freq: Once | ORAL | Status: DC | PRN

## 2018-11-28 MED ORDER — DIPHENHYDRAMINE HCL 25 MG PO CAPS: 25.0000 mg | ORAL_CAPSULE | ORAL | Status: DC | PRN

## 2018-11-28 MED ORDER — BUPIVACAINE IN DEXTROSE 0.75-8.25 % IT SOLN
INTRATHECAL | Status: DC | PRN
  Administered 2018-11-28: 1.5 mL via INTRATHECAL

## 2018-11-28 MED ORDER — MORPHINE SULFATE (PF) 0.5 MG/ML IJ SOLN
INTRAMUSCULAR | Status: AC
  Filled 2018-11-28: qty 10

## 2018-11-28 MED ORDER — OXYTOCIN 40 UNITS IN NORMAL SALINE INFUSION - SIMPLE MED: 2.5000 [IU]/h | INTRAVENOUS | Status: AC

## 2018-11-28 MED ORDER — HYDROMORPHONE HCL 1 MG/ML IJ SOLN: 0.2500 mg | INTRAMUSCULAR | Status: DC | PRN

## 2018-11-28 MED ORDER — MORPHINE SULFATE (PF) 0.5 MG/ML IJ SOLN
INTRAMUSCULAR | Status: DC | PRN
  Administered 2018-11-28: .15 mg via INTRATHECAL

## 2018-11-28 MED ORDER — PHENYLEPHRINE HCL-NACL 20-0.9 MG/250ML-% IV SOLN
INTRAVENOUS | Status: DC | PRN
  Administered 2018-11-28: 20 ug/min via INTRAVENOUS
  Administered 2018-11-28: 60 ug/min via INTRAVENOUS

## 2018-11-28 MED ORDER — DEXAMETHASONE SODIUM PHOSPHATE 10 MG/ML IJ SOLN
INTRAMUSCULAR | Status: AC
  Filled 2018-11-28: qty 1

## 2018-11-28 MED ORDER — SCOPOLAMINE 1 MG/3DAYS TD PT72
MEDICATED_PATCH | TRANSDERMAL | Status: AC
  Filled 2018-11-28: qty 1

## 2018-11-28 SURGICAL SUPPLY — 34 items
BENZOIN TINCTURE PRP APPL 2/3 (GAUZE/BANDAGES/DRESSINGS) ×3 IMPLANT
CHLORAPREP W/TINT 26ML (MISCELLANEOUS) ×3 IMPLANT
CLAMP CORD UMBIL (MISCELLANEOUS) IMPLANT
CLOSURE WOUND 1/2 X4 (GAUZE/BANDAGES/DRESSINGS) ×1
CLOTH BEACON ORANGE TIMEOUT ST (SAFETY) ×3 IMPLANT
DRSG OPSITE POSTOP 4X10 (GAUZE/BANDAGES/DRESSINGS) ×3 IMPLANT
ELECT REM PT RETURN 9FT ADLT (ELECTROSURGICAL) ×3
ELECTRODE REM PT RTRN 9FT ADLT (ELECTROSURGICAL) ×1 IMPLANT
EXTRACTOR VACUUM BELL STYLE (SUCTIONS) IMPLANT
GLOVE BIOGEL PI IND STRL 6.5 (GLOVE) ×1 IMPLANT
GLOVE BIOGEL PI IND STRL 7.0 (GLOVE) ×2 IMPLANT
GLOVE BIOGEL PI INDICATOR 6.5 (GLOVE) ×2
GLOVE BIOGEL PI INDICATOR 7.0 (GLOVE) ×4
GLOVE ORTHOPEDIC STR SZ6.5 (GLOVE) ×3 IMPLANT
GOWN STRL REUS W/TWL LRG LVL3 (GOWN DISPOSABLE) ×9 IMPLANT
KIT ABG SYR 3ML LUER SLIP (SYRINGE) IMPLANT
NEEDLE HYPO 22GX1.5 SAFETY (NEEDLE) ×3 IMPLANT
NEEDLE HYPO 25X1 1.5 SAFETY (NEEDLE) IMPLANT
NS IRRIG 1000ML POUR BTL (IV SOLUTION) ×3 IMPLANT
PACK C SECTION WH (CUSTOM PROCEDURE TRAY) ×3 IMPLANT
PAD OB MATERNITY 4.3X12.25 (PERSONAL CARE ITEMS) ×3 IMPLANT
PENCIL SMOKE EVAC W/HOLSTER (ELECTROSURGICAL) IMPLANT
STRIP CLOSURE SKIN 1/2X4 (GAUZE/BANDAGES/DRESSINGS) ×2 IMPLANT
SUT MON AB 4-0 PS1 27 (SUTURE) ×3 IMPLANT
SUT PLAIN 0 NONE (SUTURE) IMPLANT
SUT PLAIN 2 0 (SUTURE) ×2
SUT PLAIN ABS 2-0 CT1 27XMFL (SUTURE) ×1 IMPLANT
SUT VIC AB 0 CT1 36 (SUTURE) ×6 IMPLANT
SUT VIC AB 0 CTX 36 (SUTURE) ×2
SUT VIC AB 0 CTX36XBRD ANBCTRL (SUTURE) ×1 IMPLANT
SYR CONTROL 10ML LL (SYRINGE) ×3 IMPLANT
TOWEL OR 17X24 6PK STRL BLUE (TOWEL DISPOSABLE) ×3 IMPLANT
TRAY FOLEY W/BAG SLVR 14FR LF (SET/KITS/TRAYS/PACK) ×3 IMPLANT
WATER STERILE IRR 1000ML POUR (IV SOLUTION) ×3 IMPLANT

## 2018-11-28 NOTE — H&P (Signed)
OBSTETRIC ADMISSION HISTORY AND PHYSICAL  Shirley Greene is a 23 y.o. female G2P1001 with IUP at [redacted]w[redacted]d presenting for SOL with SROM (clear fluid with light pink discoloration) at 0640. She was originally schedule for a repeat Cesarean section on Monday 12/02/18. She reports +FMs. No VB, blurry vision, headaches, peripheral edema, or RUQ pain. She plans on breast and bottle feeding. She requests postpartum IUD for birth control.  Dating: By LMP confirmed with first trimester [place] --->  Estimated Date of Delivery: 12/09/18  Sono:   @[redacted]w[redacted]d , CWD, normal anatomy, cephalic presentation, 628g, 52%ile, EFW 1#6  Prenatal History/Complications: Alpha thalassemia silent carrier  Past Medical History: Past Medical History:  Diagnosis Date  . Anemia   . Cervical dysplasia 02/15/2017   02/2018: pap neg  . Chlamydia   . Infection    UTI    Past Surgical History: Past Surgical History:  Procedure Laterality Date  . CESAREAN SECTION N/A 04/05/2012   Procedure: CESAREAN SECTION;  Surgeon: 04/07/2012, MD;  Location: [facility];  Service: Obstetrics;  Laterality: N/A;    Obstetrical History: OB History    Gravida  2   Para  1   Term  1   Preterm      AB      Living  1     SAB      TAB      Ectopic      Multiple      Live Births  1        Obstetric Comments  "OP" presentation, got stuck at 9cm        Social History: Social History   Socioeconomic History  . Marital status: Single    Spouse name: Not on file  . Number of children: Not on file  . Years of education: Not on file  . Highest education level: Not on file  Occupational History  . Not on file  Social Needs  . Financial resource strain: Not on file  . Food insecurity    Worry: Not on file    Inability: Not on file  . Transportation needs    Medical: Not on file    Non-medical: Not on file  Tobacco Use  . Smoking status: Never Smoker  . Smokeless tobacco: Never Used  Substance and Sexual Activity  .  Alcohol use: Not Currently    Comment: twice a month  . Drug use: No  . Sexual activity: Not Currently    Birth control/protection: None  Lifestyle  . Physical activity    Days per week: Not on file    Minutes per session: Not on file  . Stress: Not on file  Relationships  . Social Tilda Burrow on phone: Not on file    Gets together: Not on file    Attends religious service: Not on file    Active member of club or organization: Not on file    Attends meetings of clubs or organizations: Not on file    Relationship status: Not on file  Other Topics Concern  . Not on file  Social History Narrative  . Not on file    Family History: Family History  Problem Relation Age of Onset  . Hypertension Mother   . Hypertension Father   . Cancer Maternal Grandfather     Allergies: No Known Allergies  Medications Prior to Admission  Medication Sig Dispense Refill Last Dose  . ferrous gluconate (FERGON) 324 MG tablet Take 1 tablet (324  mg total) by mouth daily with breakfast. 60 tablet 1 11/25/2018  . ferrous sulfate 325 (65 FE) MG EC tablet Take 325 mg by mouth 3 (three) times daily with meals.     . hydrOXYzine (VISTARIL) 50 MG capsule Take 1 capsule (50 mg total) by mouth 3 (three) times daily as needed. (Patient not taking: Reported on 10/28/2018) 90 capsule 2   . miconazole (MONISTAT 7) 2 % vaginal cream Place 1 Applicatorful vaginally at bedtime. Apply for seven nights (Patient not taking: Reported on 10/28/2018) 30 g 2   . polyethylene glycol (MIRALAX) 17 g packet Take 17 g by mouth daily. (Patient not taking: Reported on 10/28/2018) 30 each 1   . Prenatal Vit-Fe Fumarate-FA (PRENATAL MULTIVITAMIN) TABS tablet Take 1 tablet by mouth daily at 12 noon.     . promethazine (PHENERGAN) 25 MG tablet Take 1 tablet (25 mg total) by mouth every 6 (six) hours as needed for nausea or vomiting. (Patient not taking: Reported on 11/04/2018) 30 tablet 1      Review of Systems   All  systems reviewed and negative except as stated in HPI  Blood pressure 119/78, pulse (!) 103, temperature 98.3 F (36.8 C), temperature source Oral, resp. rate 18, height 5' (1.524 m), weight 72 kg, last menstrual period 03/04/2018. General appearance: alert, cooperative and appears stated age Lungs: regular rate and effort Heart: regular rate  Abdomen: soft, non-tender Extremities: Homans sign is negative, no sign of DVT Presentation: cephalic Fetal monitoringBaseline: 145 bpm, Variability: Good {> 6 bpm), Accelerations: Reactive and Decelerations: Absent Uterine activity: irregular, q5-6 minutes Dilation: 1 Effacement (%): Thick Station: -3 Exam by:: Dr. Darene Lamer   Prenatal labs: ABO, Rh: A/Positive/-- (04/30 0944) Antibody: Negative (04/30 0944) Rubella: 3.37 (04/30 0944) RPR: Non Reactive (08/25 0835)  HBsAg: Negative (04/30 0944)  HIV: Non Reactive (08/25 0835)  GBS: Negative/-- (10/28 1050)  2 hr GTT normal  Prenatal Transfer Tool  Maternal Diabetes: No Genetic Screening: Normal Maternal Ultrasounds/Referrals: Normal Fetal Ultrasounds or other Referrals:  None Maternal Substance Abuse:  No Significant Maternal Medications:  None Significant Maternal Lab Results: Group B Strep negative  Results for orders placed or performed during the hospital encounter of 11/28/18 (from the past 24 hour(s))  CBC   Collection Time: 11/28/18  9:02 AM  Result Value Ref Range   WBC 9.7 4.0 - 10.5 K/uL   RBC 4.58 3.87 - 5.11 MIL/uL   Hemoglobin 10.4 (L) 12.0 - 15.0 g/dL   HCT 34.3 (L) 36.0 - 46.0 %   MCV 74.9 (L) 80.0 - 100.0 fL   MCH 22.7 (L) 26.0 - 34.0 pg   MCHC 30.3 30.0 - 36.0 g/dL   RDW 17.4 (H) 11.5 - 15.5 %   Platelets 297 150 - 400 K/uL   nRBC 0.2 0.0 - 0.2 %    Patient Active Problem List   Diagnosis Date Noted  . History of cesarean delivery, currently pregnant 11/28/2018  . Unwanted fertility 11/04/2018  . Sciatica 07/16/2018  . Alpha thalassemia silent carrier  05/27/2018  . Supervision of other normal pregnancy, antepartum 05/16/2018  . History of cesarean delivery 05/16/2018    Assessment: Shirley Greene is a 23 y.o. G2P1001 at [redacted]w[redacted]d here for SROM and SOL, declines TOLAC.  1. Labor: SOL with SROM, does not desire TOLAC 2. FWB: Cat 1 tracing, fetal status reassuring 3. GBS: negative   Plan: The risks of cesarean section were discussed with the patient including but were not limited to: bleeding  which may require transfusion or reoperation; infection which may require antibiotics; injury to bowel, bladder, ureters or other surrounding organs; injury to the fetus; need for additional procedures including hysterectomy in the event of a life-threatening hemorrhage; placental abnormalities wth subsequent pregnancies, incisional problems, thromboembolic phenomenon and other postoperative/anesthesia complications. The patient concurred with the proposed plan, giving informed written consent for the procedures.  Patient has been NPO since 10 PM she will remain NPO for procedure. Anesthesia and OR aware.  Preoperative prophylactic antibiotics and SCDs ordered on call to the OR.  To OR when ready.   Umeka Wrench L Mckinley Adelstein, DO  11/28/2018, 9:24 AM

## 2018-11-28 NOTE — Op Note (Signed)
Operative Note   SURGERY DATE: 11/28/2018  PRE-OP DIAGNOSIS:  *Pregnancy at [redacted] weeks gestation *H/o Cesarean delivery *Repeat elective Cesarean delivery  POST-OP DIAGNOSIS:  *Pregnancy at [redacted] weeks gestation *H/o Cesarean delivery *Repeat elective Cesarean delivery   PROCEDURE: repeat low transverse cesarean section via pfannenstiel skin incision with double layer uterine closure, lysis of adhesions  SURGEON: Surgeon(s) and Role:    * Ervin, Audrie Lia, MD - Primary    * Alicia Seib, Dan Europe, DO  ASSISTANT: Eldridge Abrahams, MS-3  ANESTHESIA: spinal  ESTIMATED BLOOD LOSS: 370 mL  DRAINS: 370 mL UOP via indwelling foley  TOTAL IV FLUIDS: 1800 mL crystalloid  VTE PROPHYLAXIS: SCDs to bilateral lower extremities  ANTIBIOTICS: Two grams of Cefazolin and 500 mg Azithromycin were given., within 1 hour of skin incision  SPECIMENS: None  COMPLICATIONS: None  INDICATIONS: Elective Repeat Cesarean  FINDINGS: Some intra-abdominal adhesions were noted. Grossly normal uterus, tubes and ovaries. Clear amniotic fluid, cephalic female infant, weight per medical record, APGARs 8/9, intact placenta.  PROCEDURE IN DETAIL: The patient was taken to the operating room where anesthesia was administered and normal fetal heart tones were confirmed. She was then prepped and draped in the normal fashion in the dorsal supine position with a leftward tilt.  After a time out was performed, a pfannensteil skin incision was made with the scalpel and carried through to the underlying layer of fascia. The fascia was then incised at the midline and this incision was extended laterally with the mayo scissors. Attention was turned to the superior aspect of the fascial incision which was grasped with the kocher clamps x 2, tented up and the rectus muscles were dissected off with bluntly and sharply. In a similar fashion the inferior aspect of the fascial incision was grasped with the kocher clamps, tented up and the  rectus muscles dissected off with the mayo scissors. The rectus muscles were then separated in the midline and the peritoneum was entered bluntly. The Alexis retractor was inserted and the vesicouterine peritoneum was identified.  A low transverse hysterotomy was made with the scalpel until the endometrial cavity was breached and the amniotic sac ruptured with the Allis clamp, yielding clear amniotic fluid. This incision was extended bluntly and the infant's head was delivered atraumatically. There was one nuchal cord with was reduced, and then the shoulders and body were delivered atraumatically with ease.The cord was clamped x 2 and cut, and the infant was handed to the awaiting pediatricians, after delayed cord clamping was done.  The placenta was then gradually expressed from the uterus and then the uterus was exteriorized and cleared of all clots and debris. The hysterotomy was repaired with a running suture of 1-0 chromic gut. A second imbricating layer of 1-0 chromic gut suture was then placed. The Bovie was used to cauterize some mild bleeding on the uterus and excellent hemostasis was achieved.   The uterus and adnexa were then returned to the abdomen, and the hysterotomy and all operative sites were reinspected and excellent hemostasis was noted after irrigation and suction of the abdomen with warm saline.  The peritoneum was closed with a running stitch of 2-0 Vicryl. The fascia was reapproximated with 0 Vicryl in a simple running fashion bilaterally. The subcutaneous layer was then reapproximated with interrupted sutures of 2-0 Vicryl, and the skin was then closed with 4-0 Vicryl, in a subcuticular fashion.  The patient  tolerated the procedure well. Sponge, lap, needle, and instrument counts were correct x 2. The  patient was transferred to the recovery room awake, alert and breathing independently in stable condition.  Marlowe Alt, DO [facility] [employer] [facility] and exercise psychologist)

## 2018-11-28 NOTE — Anesthesia Procedure Notes (Signed)
Spinal  Patient location during procedure: OB Start time: 11/28/2018 11:42 AM End time: 11/28/2018 11:47 AM Staffing Anesthesiologist: Barnet Glasgow, MD Performed: anesthesiologist  Preanesthetic Checklist Completed: patient identified, surgical consent, pre-op evaluation, timeout performed, IV checked, risks and benefits discussed and monitors and equipment checked Spinal Block Patient position: sitting Prep: site prepped and draped and DuraPrep Patient monitoring: heart rate, cardiac monitor, continuous pulse ox and blood pressure Approach: midline Location: L3-4 Injection technique: single-shot Needle Needle type: Pencan  Needle gauge: 24 G Needle length: 10 cm Needle insertion depth: 6 cm Assessment Sensory level: T4 Additional Notes 1 Attempt (s). Pt tolerated procedure well.

## 2018-11-28 NOTE — MAU Note (Signed)
Covid swab collected Pt tolerated well. Asymptomatic 

## 2018-11-28 NOTE — Discharge Summary (Signed)
Postpartum Discharge Summary     Patient Name: Shirley Greene DOB: [birth year] MRN: [id number]  Date of admission: 11/28/2018 Delivering Provider: Chancy Milroy   Date of discharge: 11/30/2018  Admitting diagnosis:  38WKS,LEAKING FLUID Intrauterine pregnancy: [redacted]w[redacted]d    Secondary diagnosis:  Active Problems:   History of cesarean delivery, currently pregnant   History of C-section   [redacted] weeks gestation of pregnancy   Acute blood loss as cause of postoperative anemia  Additional problems: None     Discharge diagnosis: Term Pregnancy Delivered                                                                                                Post partum procedures:None  Augmentation: None  Complications: None  Hospital course:  Sceduled C/S   [name] at 36w3das admitted to the hospital 11/28/2018 for SOL with SROM. She declined TOLAC and was therefore taken to the OR for repeat elective cesarean section.  Membrane Rupture Time/Date: 6:40 AM ,11/28/2018    Patient delivered a Viable infant.11/28/2018   Details of operation can be found in separate operative note.  [name] had an uncomplicated postpartum course. Desires outpatient IUD. PO iron initiated. She is ambulating, tolerating a regular diet, passing flatus, and urinating well. Patient is discharged home in stable condition on  11/30/18        Delivery time: 12:08 PM   Magnesium Sulfate received: No BMZ received: No Rhophylac:N/A MMR:N/A Transfusion:No  Physical exam  Vitals:   11/28/18 2215 11/29/18 0213 11/29/18 0616 11/29/18 2055  BP: (!) 91/57 (!) 91/59 100/63 (!) 91/57  Pulse: 63 65 82 78  Resp: _0 Temp: 98.7 F (37.1 C) 98.7 F (37.1 C) 97.6 F (36.4 C) 98.2 F (36.8 C)  TempSrc: Oral Oral Oral Oral  SpO2: 98% 100% 100% 100%  Weight:      Height:       General: alert, cooperative and no distress Lochia: appropriate Uterine Fundus: firm Incision: Healing well with no  significant drainage, No significant erythema, Dressing is clean, dry, and intact DVT Evaluation: No evidence of DVT seen on physical exam. Labs: Lab Results  Component Value Date   WBC 16.5 (H) 11/29/2018   HGB 8.3 (L) 11/29/2018   HCT 26.8 (L) 11/29/2018   MCV 73.4 (L) 11/29/2018   PLT 239 11/29/2018   CMP Latest Ref Rng & Units 11/28/2018  Glucose 70 - 99 mg/dL 95  BUN 6 - 20 mg/dL 5(L)  Creatinine 0.44 - 1.00 mg/dL 0.73  Sodium 135 - 145 mmol/L 136  Potassium 3.5 - 5.1 mmol/L 4.0  Chloride 98 - 111 mmol/L 105  CO2 22 - 32 mmol/L 19(L)  Calcium 8.9 - 10.3 mg/dL 9.0  Total Protein 6.5 - 8.1 g/dL 6.3(L)  Total Bilirubin 0.3 - 1.2 mg/dL 0.9  Alkaline Phos 38 - 126 U/L 142(H)  AST 15 - 41 U/L 33  ALT 0 - 44 U/L 15    Discharge instruction: per After Visit Summary and "Baby and Me Booklet".  After visit meds:  Allergies as  of 11/30/2018   No Known Allergies     Medication List    STOP taking these medications   ferrous gluconate 324 MG tablet Commonly known as: FERGON   hydrOXYzine 50 MG capsule Commonly known as: Vistaril   miconazole 2 % vaginal cream Commonly known as: MONISTAT 7   promethazine 25 MG tablet Commonly known as: PHENERGAN     TAKE these medications   acetaminophen 500 MG tablet Commonly known as: TYLENOL Take 2 tablets (1,000 mg total) by mouth every 6 (six) hours as needed.   ferrous sulfate 325 (65 FE) MG EC tablet Take 325 mg by mouth 3 (three) times daily with meals.   ibuprofen 800 MG tablet Commonly known as: ADVIL Take 1 tablet (800 mg total) by mouth every 8 (eight) hours as needed for mild pain.   oxyCODONE 5 MG immediate release tablet Commonly known as: Oxy IR/ROXICODONE Take 1-2 tablets (5-10 mg total) by mouth every 4 (four) hours as needed for moderate pain.   polyethylene glycol 17 g packet Commonly known as: MiraLax Take 17 g by mouth daily.   prenatal multivitamin Tabs tablet Take 1 tablet by mouth daily at 12  noon.   senna-docusate 8.6-50 MG tablet Commonly known as: Senokot-S Take 2 tablets by mouth at bedtime as needed for mild constipation.       Diet: routine diet  Activity: Advance as tolerated. Pelvic rest for 6 weeks.   Outpatient follow up:4 weeks Follow up Appt:No future appointments. Follow up Visit: Please schedule this patient for Postpartum visit in: 4 weeks with the following provider: Any provider For C/S patients schedule nurse incision check in weeks 2 weeks: yes Low risk pregnancy complicated by: h/o cesarean delivery Delivery mode:  CS Anticipated Birth Control:  IUD at pp visit PP Procedures needed: Incision check, pp IUD Schedule Integrated BH visit: no  Newborn Data: Live born female  Birth Weight:  3810g APGAR: 56, 9   Newborn Delivery   Birth date/time: 11/28/2018 12:08:00 Delivery type: C-Section, Low Transverse Trial of labor: No C-section categorization: Repeat      Baby Feeding: Bottle and Breast Disposition:home with mother   11/30/2018 Shirley Mann, MD

## 2018-11-28 NOTE — Anesthesia Preprocedure Evaluation (Addendum)
Anesthesia Evaluation  Patient identified by MRN, date of birth, ID band Patient awake    Reviewed: Allergy & Precautions, NPO status , Patient's Chart, lab work & pertinent test results  Airway Mallampati: II  TM Distance: >3 FB Neck ROM: Full    Dental no notable dental hx. (+) Teeth Intact   Pulmonary neg pulmonary ROS,    Pulmonary exam normal breath sounds clear to auscultation       Cardiovascular Exercise Tolerance: Good negative cardio ROS Normal cardiovascular exam Rhythm:Regular Rate:Normal     Neuro/Psych Hx of sciatica negative psych ROS   GI/Hepatic negative GI ROS, Neg liver ROS,   Endo/Other  negative endocrine ROS  Renal/GU      Musculoskeletal   Abdominal   Peds  Hematology Hgb !0.4 Plt 297 T&S  A Neg   Anesthesia Other Findings   Reproductive/Obstetrics (+) Pregnancy                           Anesthesia Physical Anesthesia Plan  ASA: II  Anesthesia Plan: Spinal   Post-op Pain Management:    Induction:   PONV Risk Score and Plan: 3 and Treatment may vary due to age or medical condition and Ondansetron  Airway Management Planned: Nasal Cannula and Natural Airway  Additional Equipment:   Intra-op Plan:   Post-operative Plan:   Informed Consent: I have reviewed the patients History and Physical, chart, labs and discussed the procedure including the risks, benefits and alternatives for the proposed anesthesia with the patient or authorized representative who has indicated his/her understanding and acceptance.     Dental advisory given  Plan Discussed with:   Anesthesia Plan Comments: (38.3 wk G2P1 w hx of prior c/s for repeat c/S)       Anesthesia Quick Evaluation

## 2018-11-28 NOTE — Transfer of Care (Signed)
Immediate Anesthesia Transfer of Care Note  Patient: Shirley Greene  Procedure(s) Performed: cesarean section (N/A Abdomen)  Patient Location: PACU  Anesthesia Type:Spinal  Level of Consciousness: awake, alert  and oriented  Airway & Oxygen Therapy: Patient Spontanous Breathing  Post-op Assessment: Report given to RN and Post -op Vital signs reviewed and stable  Post vital signs: Reviewed and stable  Last Vitals:  Vitals Value Taken Time  BP 104/59 11/28/18 1303  Temp    Pulse 91 11/28/18 1306  Resp 25 11/28/18 1306  SpO2 98 % 11/28/18 1306  Vitals shown include unvalidated device data.  Last Pain:  Vitals:   11/28/18 1012  TempSrc: Oral  PainSc:          Complications: No apparent anesthesia complications

## 2018-11-28 NOTE — Progress Notes (Signed)
Toniann Fail, RN [place] charge nurse, notified regarding pt arrival & status.  RN asked to begin prepping pt for surgery.

## 2018-11-28 NOTE — MAU Note (Signed)
Presents with c/o LOF since 0640, fluid clear with pinkish tinge.  Denies VB.  Endorses +FM.

## 2018-11-28 NOTE — Progress Notes (Signed)
Pt called RN into room, reports her entire right arm is itching.  RN assessed arm, no redness, rash, or hives noted.  Will notify MD.

## 2018-11-28 NOTE — Anesthesia Postprocedure Evaluation (Signed)
Anesthesia Post Note  Patient: Shirley Greene  Procedure(s) Performed: cesarean section (N/A Abdomen)     Patient location during evaluation: Mother Baby Anesthesia Type: Spinal Level of consciousness: oriented and awake and alert Pain management: pain level controlled Vital Signs Assessment: post-procedure vital signs reviewed and stable Respiratory status: spontaneous breathing and respiratory function stable Cardiovascular status: blood pressure returned to baseline and stable Postop Assessment: no headache, no backache, no apparent nausea or vomiting and able to ambulate Anesthetic complications: no    Last Vitals:  Vitals:   11/28/18 1400 11/28/18 1408  BP: 90/62 108/63  Pulse: 94 95  Resp: 19 18  Temp: 36.6 C   SpO2: 99% 99%    Last Pain:  Vitals:   11/28/18 1400  TempSrc: Oral  PainSc: 0-No pain   Pain Goal:    LLE Motor Response: Purposeful movement (11/28/18 1345)   RLE Motor Response: Purposeful movement (11/28/18 1345)       Epidural/Spinal Function Cutaneous sensation: Able to Discern Pressure (11/28/18 1345), Patient able to flex knees: Yes (11/28/18 1345), Patient able to lift hips off bed: No (11/28/18 1345), Back pain beyond tenderness at insertion site: No (11/28/18 1345), Progressively worsening motor and/or sensory loss: No (11/28/18 1345), Bowel and/or bladder incontinence post epidural: No (11/28/18 1345)  Barnet Glasgow

## 2018-11-28 NOTE — Lactation Note (Signed)
This note was copied from a baby's chart. Lactation Consultation Note  Patient Name: Shirley Greene [id number] Date: 11/28/2018 Reason for consult: Initial assessment;Early term 36-38.6wks  69 hours old ETI female who is being exclusively BF by his mother, she's a P2. She BF her first child for 2 weeks but quit because she got sick and had a viral infection, but her milk came in, she even got engorged when she stopped BF. She participated in the [facility] program at the [facility] and she's already familiar with hand expression. Mom told LC that she's been able to get colostrum only out of her right breast, but not the left one. Offered assistance with hand expression, and mom was able to get a small droplet of colostrum out of her left breast, praised her for her efforts.  When doing hand expression, noticed that she has flat nipples, her RN has already set her up with a hand pump and mom has been using it. LC clarified that the main purpose of pumping this early on is for breast stimulation and to help evert her nipples. She has a [place] pump at home.  Offered assistance with latch but mom politely declined, she said baby already fed. Baby was asleep on dad's arms. Asked mom to call for assistance when needed. Reviewed normal newborn behavior, cluster feeding, feeding cues and size of baby's stomach. Mom planning to supplement with formula but advised her to wait till baby is near the 24 hours mark since he's still spitty. Mom will let her RN know if she changes her mind.  Feeding plan:  1. Encouraged mom to feed baby STS 8-12 times/24 hours or sooner if feeding cues are present 2. Hand expression/pumping prior feedings were also encouraged  BF brochure, BF resources and feeding diary were reviewed. Parents reported all questions and concerns were answered, they're both aware of [facility] OP services and will call PRN.   Maternal Data Formula Feeding for Exclusion: No Has patient been taught Hand Expression?:  Yes Does the patient have breastfeeding experience prior to this delivery?: Yes  Feeding Feeding Type: Breast Fed  LATCH Score Latch: Repeated attempts needed to sustain latch, nipple held in mouth throughout feeding, stimulation needed to elicit sucking reflex.  Audible Swallowing: Spontaneous and intermittent  Type of Nipple: Everted at rest and after stimulation  Comfort (Breast/Nipple): Soft / non-tender  Hold (Positioning): Assistance needed to correctly position infant at breast and maintain latch.  LATCH Score: 8  Interventions Interventions: Breast feeding basics reviewed;Breast massage;Hand express;Breast compression;Hand pump  Lactation Tools Discussed/Used Tools: Pump Breast pump type: Manual WIC Program: Yes Pump Review: Setup, frequency, and cleaning Initiated by:: RN Date initiated:: 11/28/18   Consult Status Consult Status: Follow-up Date: 11/29/18 Follow-up type: In-patient    Shirley Greene Shirley Greene 11/28/2018, 9:50 PM

## 2018-11-29 ENCOUNTER — Encounter (HOSPITAL_COMMUNITY): Payer: Self-pay | Admitting: Obstetrics and Gynecology

## 2018-11-29 LAB — CBC
HCT: 26.8 % — ABNORMAL LOW (ref 36.0–46.0)
Hemoglobin: 8.3 g/dL — ABNORMAL LOW (ref 12.0–15.0)
MCH: 22.7 pg — ABNORMAL LOW (ref 26.0–34.0)
MCHC: 31 g/dL (ref 30.0–36.0)
MCV: 73.4 fL — ABNORMAL LOW (ref 80.0–100.0)
Platelets: 239 10*3/uL (ref 150–400)
RBC: 3.65 MIL/uL — ABNORMAL LOW (ref 3.87–5.11)
RDW: 16.8 % — ABNORMAL HIGH (ref 11.5–15.5)
WBC: 16.5 10*3/uL — ABNORMAL HIGH (ref 4.0–10.5)
nRBC: 0 % (ref 0.0–0.2)

## 2018-11-29 LAB — BIRTH TISSUE RECOVERY COLLECTION (PLACENTA DONATION)

## 2018-11-29 MED ORDER — FERROUS SULFATE 325 (65 FE) MG PO TABS
325.0000 mg | ORAL_TABLET | Freq: Every day | ORAL | Status: DC
  Administered 2018-11-29 – 2018-11-30 (×2): 325 mg via ORAL
  Filled 2018-11-29 (×2): qty 1

## 2018-11-29 NOTE — Lactation Note (Signed)
This note was copied from a baby's chart. Lactation Consultation Note  Patient Name: Shirley Greene [id number] Date: 11/29/2018 Reason for consult: Follow-up assessment Baby is 26 hours old/2% weight loss.  Mom is very happy that baby is latching easily and feeding well.  Baby was offered formula per MD's recommendation but he refused nipple.  Discussed cluster feeding and instructed to continue to feed with cues.  Mom denies questions or concerns.  Encouraged to call for assist prn.  Maternal Data    Feeding Feeding Type: Breast Fed  LATCH Score Latch: Grasps breast easily, tongue down, lips flanged, rhythmical sucking.  Audible Swallowing: Spontaneous and intermittent  Type of Nipple: Everted at rest and after stimulation  Comfort (Breast/Nipple): Soft / non-tender  Hold (Positioning): Assistance needed to correctly position infant at breast and maintain latch.  LATCH Score: 9  Interventions    Lactation Tools Discussed/Used     Consult Status Consult Status: Follow-up Date: 11/30/18 Follow-up type: In-patient    Ave Filter 11/29/2018, 2:23 PM

## 2018-11-29 NOTE — Lactation Note (Signed)
This note was copied from a baby's chart. Lactation Consultation Note  Patient Name: Shirley Greene [id number] Date: 11/29/2018 Reason for consult: Follow-up assessment;Mother's request;Difficult latch;Early term 37-38.6wks P2, 32 hour female infant weight loss -2%. This is mom's 2nd C/S delivery. Infant is currently cluster feeding. Per mom, she is experiencing   pain with latch and not a tug.  LC entered the room mom was attempting to latch infant sitting in chair without pillow support and was leaning downward in chair towards infant.  Mom was open to [facility] suggestions, undress infant and did STS while breastfeeding, pillows use to help support breastfeeding. Mom latched infant on left breast using the cross cradle hold, LC ask mom to bring infant towards breast and not lean forward, wait until infant's mouth was wide and tongue down, bring infant to breast, chin first with nose and chin touching breast. LC observed swallows and infant was in rthymitic sucking pattern and was still breastfeeding after 7 minutes when [place] left room. Mom was very thankful of LC help, per mom , she is only feeling  a tug now  and breastfeeding is no longer  painful.  Maternal Data    Feeding Feeding Type: Breast Fed  LATCH Score Latch: Grasps breast easily, tongue down, lips flanged, rhythmical sucking.  Audible Swallowing: Spontaneous and intermittent  Type of Nipple: Everted at rest and after stimulation  Comfort (Breast/Nipple): Soft / non-tender  Hold (Positioning): Assistance needed to correctly position infant at breast and maintain latch.  LATCH Score: 9  Interventions Interventions: Assisted with latch;Adjust position;Support pillows;Position options;Skin to skin  Lactation Tools Discussed/Used     Consult Status Consult Status: Follow-up Date: 11/30/18 Follow-up type: In-patient    Vicente Serene 11/29/2018, 8:11 PM

## 2018-11-29 NOTE — Progress Notes (Signed)
Subjective: Postpartum Day 1: Cesarean Delivery Patient reports feeling "great." At times she has no pain at all. She is ambulating without difficulty. Tolerating a regular diet and urinating. Minimal lochia.   Objective: Vital signs in last 24 hours: Temp:  [97.6 F (36.4 C)-98.7 F (37.1 C)] 97.6 F (36.4 C) (11/13 0616) Pulse Rate:  [63-106] 82 (11/13 0616) Resp:  [16-36] 18 (11/13 0616) BP: (90-147)/(56-127) 100/63 (11/13 0616) SpO2:  [98 %-100 %] 100 % (11/13 0616) Weight:  [72 kg] 72 kg (11/12 0832)  Physical Exam:  General: alert, cooperative and appears stated age Lochia: appropriate Uterine Fundus: firm Incision: healing well, no significant drainage DVT Evaluation: No evidence of DVT seen on physical exam.  Recent Labs    11/28/18 0902 11/29/18 0525  HGB 10.4* 8.3*  HCT 34.3* 26.8*    Assessment/Plan: Status post Cesarean section. Doing well postoperatively.  Continue current care. Breastfeeding Desires outpatient IUD BTL done PO Ferrous sulfate initiated  Plan for [place] 11/29/2018, 6:42 AM

## 2018-11-30 ENCOUNTER — Other Ambulatory Visit (HOSPITAL_COMMUNITY)
Admission: RE | Admit: 2018-11-30 | Discharge: 2018-11-30 | Disposition: A | Discharge status: Home / Self Care | Payer: Medicaid Other | Source: Ambulatory Visit | Attending: Obstetrics and Gynecology | Admitting: Obstetrics and Gynecology

## 2018-11-30 DIAGNOSIS — Z3A38 38 weeks gestation of pregnancy: Secondary | ICD-10-CM

## 2018-11-30 DIAGNOSIS — D62 Acute posthemorrhagic anemia: Secondary | ICD-10-CM

## 2018-11-30 MED ORDER — IBUPROFEN 800 MG PO TABS: 800.0000 mg | ORAL_TABLET | Freq: Three times a day (TID) | ORAL | 0 refills | Status: DC | PRN

## 2018-11-30 MED ORDER — OXYCODONE HCL 5 MG PO TABS: 5.0000 mg | ORAL_TABLET | ORAL | 0 refills | Status: DC | PRN

## 2018-11-30 MED ORDER — SENNOSIDES-DOCUSATE SODIUM 8.6-50 MG PO TABS: 2.0000 | ORAL_TABLET | Freq: Every evening | ORAL | 0 refills | Status: DC | PRN

## 2018-11-30 MED ORDER — ACETAMINOPHEN 500 MG PO TABS: 1000.0000 mg | ORAL_TABLET | Freq: Four times a day (QID) | ORAL | 0 refills | Status: DC | PRN

## 2018-11-30 NOTE — Lactation Note (Signed)
This note was copied from a baby's chart. Lactation Consultation Note  Patient Name: Shirley Greene Date: 11/30/2018   Infant is 60 hrs old. Mom said that her nipples "hurt really, really bad." Her L nipple appears atraumatic; her R nipple has a compression stripe. Mom reports that when infant releases latch, one side of her nipple is flattened. Mom's breasts are soft.   I offered to return to help Mom with latching & she agreed.    "[name]" is sleeping & was recently fed with formula, so Mom will call out for me when she is ready for me to return. I discussed possibly trying doing a laid-back nursing position with Mom when I return & she agreed.   [name] 11/30/2018, 9:35 AM

## 2018-12-02 ENCOUNTER — Ambulatory Visit (INDEPENDENT_AMBULATORY_CARE_PROVIDER_SITE_OTHER): Payer: Medicaid Other | Admitting: *Deleted

## 2018-12-02 ENCOUNTER — Other Ambulatory Visit: Payer: Self-pay

## 2018-12-02 VITALS — BP 109/70 | HR 109 | Wt 148.0 lb

## 2018-12-02 DIAGNOSIS — Z9889 Other specified postprocedural states: Secondary | ICD-10-CM

## 2018-12-02 DIAGNOSIS — Z98891 History of uterine scar from previous surgery: Secondary | ICD-10-CM

## 2018-12-02 NOTE — Progress Notes (Signed)
Patient seen and assessed by nursing staff.  Agree with documentation and plan.  

## 2018-12-02 NOTE — Progress Notes (Signed)
Pt here today for incision check from her c section on 11/12. Honeycomb removed, steri strips intact. Incision healing well, no drainage, bleeding or redness noted. Pt to follow up at postpartum visit ,and to call if she has any questions or issues.   Crosby Oyster, RN

## 2018-12-10 ENCOUNTER — Other Ambulatory Visit: Payer: Self-pay

## 2018-12-10 ENCOUNTER — Ambulatory Visit (INDEPENDENT_AMBULATORY_CARE_PROVIDER_SITE_OTHER): Payer: Medicaid Other

## 2018-12-10 VITALS — BP 95/67 | HR 70

## 2018-12-10 DIAGNOSIS — Z5189 Encounter for other specified aftercare: Secondary | ICD-10-CM

## 2018-12-10 NOTE — Progress Notes (Signed)
Subjective:     Shirley Greene is a 23 y.o. female who presents to the clinic two weeks status post for cesarean section . Eating a regular diet with out difficulty. Bowel movements are normal. The patient reports minimum pain at this time.    Review of Systems  Objective:    95/67 R70 General:   well appearance  Abdomen: post op abd exam:soft","bowel sounds active","non-tender  Incision:   incision:no dehiscence","incision well approximated","healing well"no drainage","no erythema","no hernia","no seroma","no swelling     Assessment:    Doing well  Plan:    1. Continue any current medications. 2. Wound care discussed. 3. Activity restrictions: As tolerated 4. Anticipated return to work: 4-6 weeks. 5. Follow up: 12/26/2018 for postpartum .   Demetrice Jeanella Anton, CMA'

## 2018-12-11 NOTE — Progress Notes (Signed)
Patient seen and assessed by nursing staff during this encounter. I have reviewed the chart and agree with the documentation and plan.  Abella Shugart, MD 12/11/2018 10:44 AM    

## 2018-12-26 ENCOUNTER — Ambulatory Visit: Payer: Medicaid Other | Admitting: Obstetrics and Gynecology

## 2019-01-08 ENCOUNTER — Encounter: Payer: Self-pay | Admitting: Certified Nurse Midwife

## 2019-01-08 ENCOUNTER — Ambulatory Visit (INDEPENDENT_AMBULATORY_CARE_PROVIDER_SITE_OTHER): Payer: Medicaid Other | Admitting: Certified Nurse Midwife

## 2019-01-08 ENCOUNTER — Other Ambulatory Visit: Payer: Self-pay

## 2019-01-08 DIAGNOSIS — Z30016 Encounter for initial prescription of transdermal patch hormonal contraceptive device: Secondary | ICD-10-CM

## 2019-01-08 DIAGNOSIS — Z3202 Encounter for pregnancy test, result negative: Secondary | ICD-10-CM

## 2019-01-08 DIAGNOSIS — Z1389 Encounter for screening for other disorder: Secondary | ICD-10-CM

## 2019-01-08 LAB — POCT URINE PREGNANCY: Preg Test, Ur: NEGATIVE

## 2019-01-08 MED ORDER — NORELGESTROMIN-ETH ESTRADIOL 150-35 MCG/24HR TD PTWK: 1.0000 | MEDICATED_PATCH | TRANSDERMAL | 15 refills | Status: DC

## 2019-01-08 NOTE — Progress Notes (Signed)
Post Partum Exam  Shirley Greene is a 23 y.o. G42P2002 female who presents for a postpartum visit. She is 6 weeks postpartum following a spontaneous vaginal delivery. I have fully reviewed the prenatal and intrapartum course. The delivery was at 50 gestational weeks and 3 days.  Anesthesia: spinal. Postpartum course has been unremarkable. Baby's course has been unremarkable. Baby is feeding by bottle - Similac Alimentum. Bleeding no bleeding. Bowel function is normal. Bladder function is normal. Patient is not sexually active. Contraception method is undecided. Postpartum depression screening:neg  The following portions of the patient's history were reviewed and updated as appropriate: allergies, current medications, past medical history, past social history, past surgical history and problem list. Last pap smear done 02/26/18 and was Normal  Review of Systems A comprehensive review of systems was negative.    Objective:  Blood pressure 110/71, pulse 83, weight 141 lb (64 kg), unknown if currently breastfeeding.  General:  alert, cooperative and no distress   Breasts:  inspection negative, no nipple discharge or bleeding, no masses or nodularity palpable  Lungs: clear to auscultation bilaterally  Heart:  regular rate and rhythm  Abdomen: soft, non-tender; bowel sounds normal; no masses,  no organomegaly- C/S incision healing well    Vulva:  not evaluated  Vagina: not evaluated  Cervix:  not evaluated   Corpus: not examined  Adnexa:  not evaluated  Rectal Exam: Not performed.        Assessment/Plan:  1. Postpartum care and examination  -Normal postpartum exam. Pap smear not done at today's visit- not needed until 2023 - Educated and discussed birth control options in detail including LARC methods  - Patient reports being on the patch in the past without any complaints or concerns and would like to restart on the patch   2. Encounter for initial prescription of transdermal patch hormonal  contraceptive device - UPT negative  - POCT urine pregnancy - norelgestromin-ethinyl estradiol (ORTHO EVRA) 150-35 MCG/24HR transdermal patch; Place 1 patch onto the skin once a week.  Dispense: 3 patch; Refill: 15  Follow up in: 1 year for annual or as needed.    Lajean Manes, CNM 01/08/19, 4:49 PM

## 2019-09-08 ENCOUNTER — Encounter (HOSPITAL_COMMUNITY): Payer: Self-pay

## 2019-09-08 ENCOUNTER — Other Ambulatory Visit: Payer: Self-pay

## 2019-09-08 ENCOUNTER — Ambulatory Visit (HOSPITAL_COMMUNITY)
Admission: EM | Admit: 2019-09-08 | Discharge: 2019-09-08 | Disposition: A | Discharge status: Home / Self Care | Payer: Medicaid Other | Attending: Family Medicine | Admitting: Family Medicine

## 2019-09-08 DIAGNOSIS — S61212A Laceration without foreign body of right middle finger without damage to nail, initial encounter: Secondary | ICD-10-CM

## 2019-09-08 NOTE — ED Triage Notes (Signed)
Pt presents with complaints of a laceration to her finger that happened at 5 oclock. Area is closed after bandaid application. Pt has some bleeding when she squeezes her finger. States she cut it on a string bean can. Reports recent tdap shot.

## 2019-09-08 NOTE — Discharge Instructions (Signed)
Please avoid soaking the hand  Please follow up if your symptoms fail to improve.

## 2019-09-08 NOTE — ED Provider Notes (Signed)
[facility]    CSN: [id number] Arrival date & time: 09/08/19  1849      History   Chief Complaint Chief Complaint  Patient presents with  . Laceration    HPI Shirley Greene is a 24 y.o. female.   She is presenting with right middle finger laceration.  She cut her finger on a can of beans.  Denies numbness tingling.  She is able to control her bleeding with a Band-Aid.  Is up-to-date on her vaccines.  HPI  Past Medical History:  Diagnosis Date  . Anemia   . Cervical dysplasia 02/15/2017   02/2018: pap neg  . Chlamydia   . History of C-section 11/28/2018  . History of cesarean delivery 05/16/2018   2014 pLTCS for persistent anterior lip. D/w her that she is eligible for tolac. Can d/w her later in pregnancy   . History of cesarean delivery, currently pregnant 11/28/2018  . Infection    UTI  . Supervision of other normal pregnancy, antepartum 05/16/2018    Nursing Staff Provider Office Location  Shirley Greene   edc 11/23, lmp=5wk u/s Language  English  Anatomy US  [x ] f/u completion u/s: neg Flu Vaccine  09/10/2018 Genetic Screen  NIPS: low risk female   AFP:   TDaP vaccine   09/10/2018 Hgb A1C or  GTT Early  Third trimester: NEG Rhogam     LAB RESULTS  Feeding Plan Breast/Bottle Blood Type   A pos Contraception undecided Antibody  neg Circumcision Yes     Patient Active Problem List   Diagnosis Date Noted  . [redacted] weeks gestation of pregnancy 11/30/2018  . Acute blood loss as cause of postoperative anemia 11/30/2018  . Unwanted fertility 11/04/2018  . Sciatica 07/16/2018  . Alpha thalassemia silent carrier 05/27/2018    Past Surgical History:  Procedure Laterality Date  . CESAREAN SECTION N/A 04/05/2012   Procedure: CESAREAN SECTION;  Surgeon: Shirley Burrow, MD;  Location: [facility];  Service: Obstetrics;  Laterality: N/A;  . CESAREAN SECTION WITH BILATERAL TUBAL LIGATION N/A 11/28/2018   Procedure: cesarean section;  Surgeon: Shirley Staggers, MD;  Location: [facility];   Service: Obstetrics;  Laterality: N/A;    OB History    Gravida  2   Para  2   Term  2   Preterm      AB      Living  2     SAB      TAB      Ectopic      Multiple  0   Live Births  2        Obstetric Comments  "OP" presentation, got stuck at 9cm         Home Medications    Prior to Admission medications   Medication Sig Start Date End Date Taking? Authorizing Provider  acetaminophen (TYLENOL) 500 MG tablet Take 2 tablets (1,000 mg total) by mouth every 6 (six) hours as needed. Patient not taking: Reported on 12/10/2018 11/30/18   Shirley Arrow, MD  ferrous sulfate 325 (65 FE) MG EC tablet Take 325 mg by mouth 3 (three) times daily with meals.    [provider]  ibuprofen (ADVIL) 800 MG tablet Take 1 tablet (800 mg total) by mouth every 8 (eight) hours as needed for mild pain. Patient not taking: Reported on 12/10/2018 11/30/18   Shirley Arrow, MD  norelgestromin-ethinyl estradiol (ORTHO EVRA) 150-35 MCG/24HR transdermal patch Place 1 patch onto the skin  once a week. 01/08/19   Shirley Greene, CNM  oxyCODONE (OXY IR/ROXICODONE) 5 MG immediate release tablet Take 1-2 tablets (5-10 mg total) by mouth every 4 (four) hours as needed for moderate pain. Patient not taking: Reported on 12/10/2018 11/30/18   Shirley Arrow, MD  polyethylene glycol (MIRALAX) 17 g packet Take 17 g by mouth daily. Patient not taking: Reported on 10/28/2018 10/17/18   Shirley Bing, MD  Prenatal Vit-Fe Fumarate-FA (PRENATAL MULTIVITAMIN) TABS tablet Take 1 tablet by mouth daily at 12 noon.    [provider]  senna-docusate (SENOKOT-S) 8.6-50 MG tablet Take 2 tablets by mouth at bedtime as needed for mild constipation. Patient not taking: Reported on 01/08/2019 11/30/18   Shirley Arrow, MD    Family History Family History  Problem Relation Age of Onset  . Hypertension Mother   . Hypertension Father   . Cancer Maternal Grandfather     Social  History Social History   Tobacco Use  . Smoking status: Never Smoker  . Smokeless tobacco: Never Used  Vaping Use  . Vaping Use: Never used  Substance Use Topics  . Alcohol use: Not Currently    Comment: twice a month  . Drug use: No     Allergies   Patient has no known allergies.   Review of Systems Review of Systems  See HPI  Physical Exam Triage Vital Signs ED Triage Vitals  Enc Vitals Group     BP 09/08/19 2020 102/65     Pulse Rate 09/08/19 2020 70     Resp 09/08/19 2020 19     Temp 09/08/19 2020 98.4 F (36.9 C)     Temp src --      SpO2 09/08/19 2020 100 %     Weight --      Height --      Head Circumference --      Peak Flow --      Pain Score 09/08/19 2019 4     Pain Loc --      Pain Edu? --      Excl. in GC? --    No data found.  Updated Vital Signs BP 102/65   Pulse 70   Temp 98.4 F (36.9 C)   Resp 19   SpO2 100%   Visual Acuity Right Eye Distance:   Left Eye Distance:   Bilateral Distance:    Right Eye Near:   Left Eye Near:    Bilateral Near:     Physical Exam Gen: NAD, alert, cooperative with exam, well-appearing ENT: normal lips, normal nasal mucosa,  Eye: normal EOM, normal conjunctiva and lids Neuro: normal tone, normal sensation to touch Psych:  normal insight, alert and oriented MSK:  Right middle finger:  Linear 3 mm laceration on the ulnar aspect of the middle finger. Neurovascularly intact   UC Treatments / Results  Labs (all labs ordered are listed, but only abnormal results are displayed) Labs Reviewed - No data to display  EKG   Radiology No results found.  Procedures Procedures (including critical care time)  Medications Ordered in UC Medications - No data to display  Initial Impression / Assessment and Plan / UC Course  I have reviewed the triage vital signs and the nursing notes.  Pertinent labs & imaging results that were available during my care of the patient were reviewed by me and  considered in my medical decision making (see chart for details).     [name] is  a 24 year old female is presenting with laceration.  Use Dermabond to seal the cut.  Counseled supportive care.  Give indications on follow-up.  Final Clinical Impressions(s) / UC Diagnoses   Final diagnoses:  Laceration of right middle finger without foreign body without damage to nail, initial encounter     Discharge Instructions     Please avoid soaking the hand  Please follow up if your symptoms fail to improve.     ED Prescriptions    None     PDMP not reviewed this encounter.   Myra Rude, MD 09/08/19 2106

## 2020-02-26 ENCOUNTER — Ambulatory Visit: Payer: Medicaid Other

## 2020-03-09 ENCOUNTER — Ambulatory Visit: Payer: Medicaid Other | Admitting: Obstetrics and Gynecology

## 2020-04-17 IMAGING — US OBSTETRIC <14 WK US AND TRANSVAGINAL OB US
1 series · 15 of 28 positions shown · non-contrast
Comparison: None.

CLINICAL DATA: First trimester of pregnancy, vaginal bleeding.

EXAM:
OBSTETRIC <14 WK US AND TRANSVAGINAL OB US
TECHNIQUE: Both transabdominal and transvaginal ultrasound examinations were
performed for complete evaluation of the gestation as well as the
maternal uterus, adnexal regions, and pelvic cul-de-sac.
Transvaginal technique was performed to assess early pregnancy.

[Series 1: obstetric <14 wk us and transvaginal ob us · 15 of 74 slices shown]
[im 1/74]
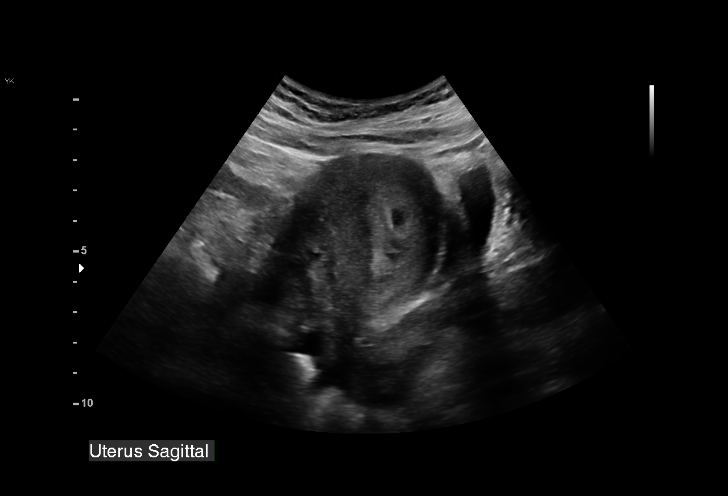
[im 6/74]
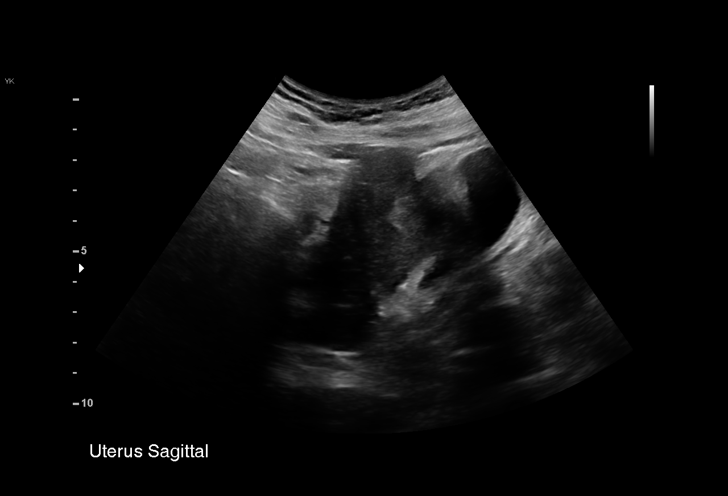
[im 11/74]
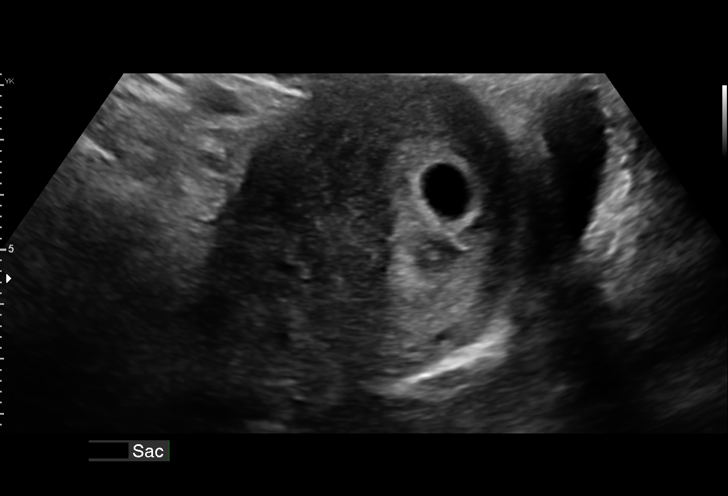
[im 17/74]
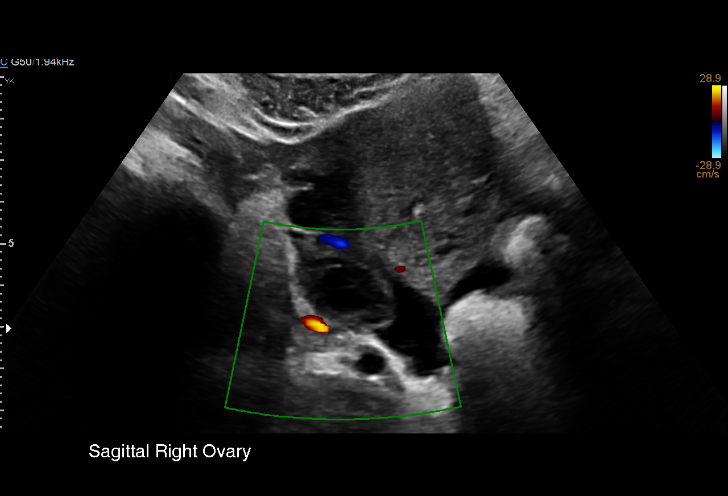
[im 22/74]
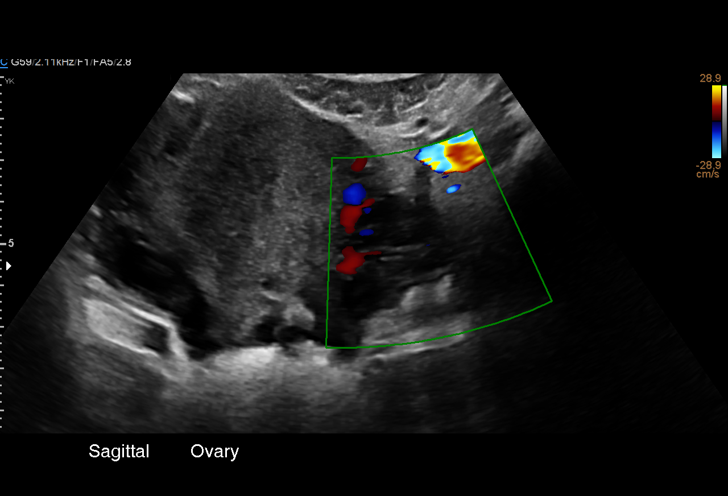
[im 28/74]
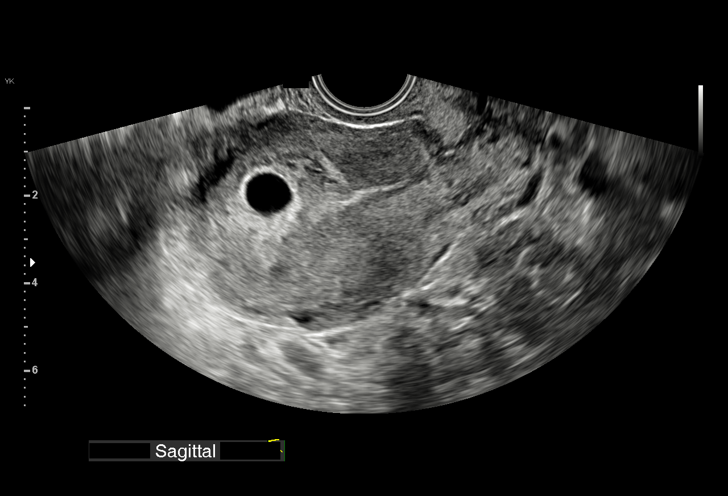
[im 33/74]
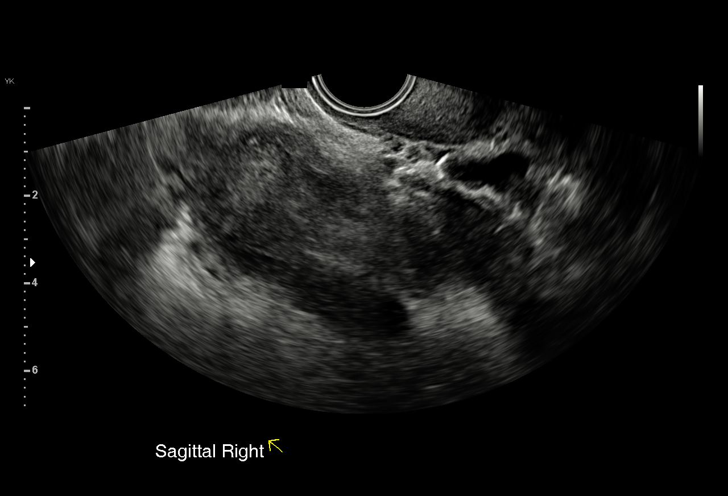
[im 38/74]
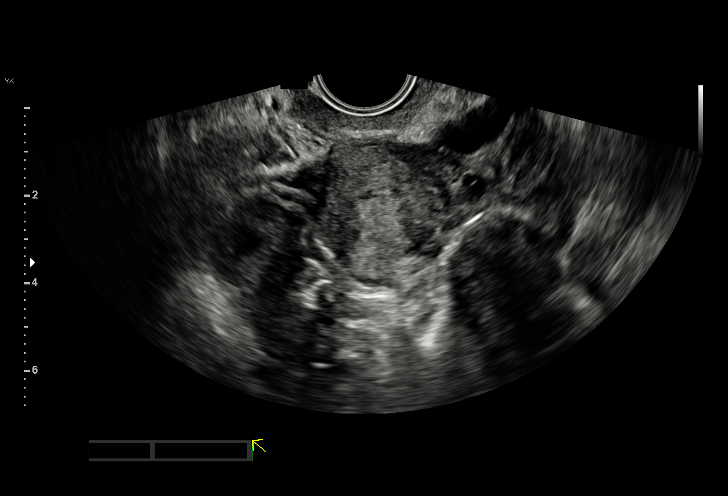
[im 41/74]
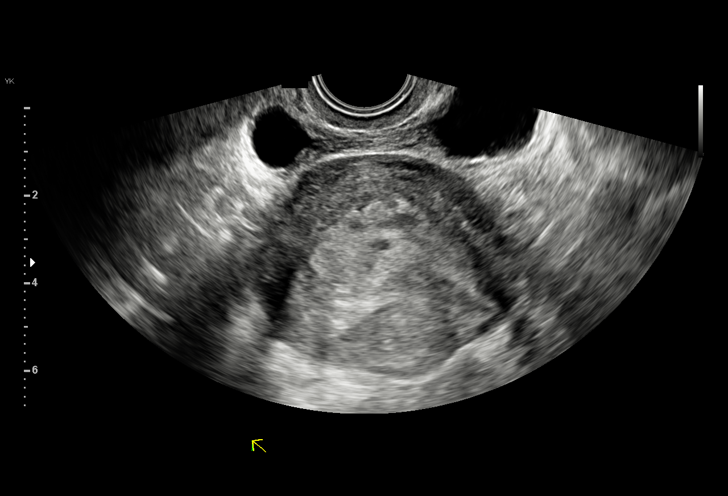
[im 46/74]
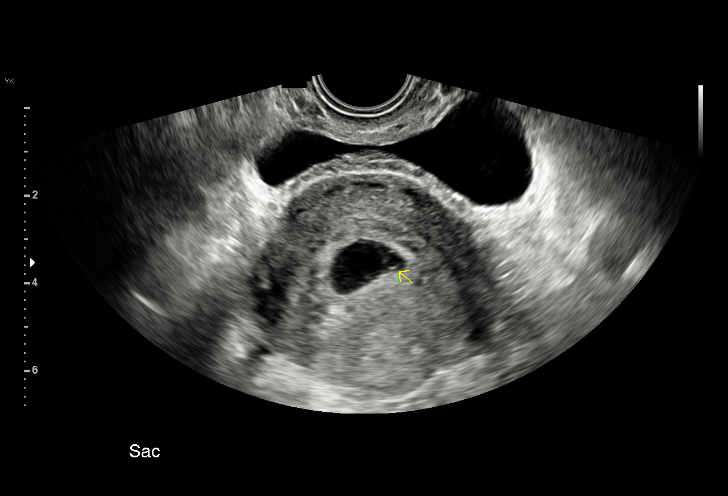
[im 52/74]
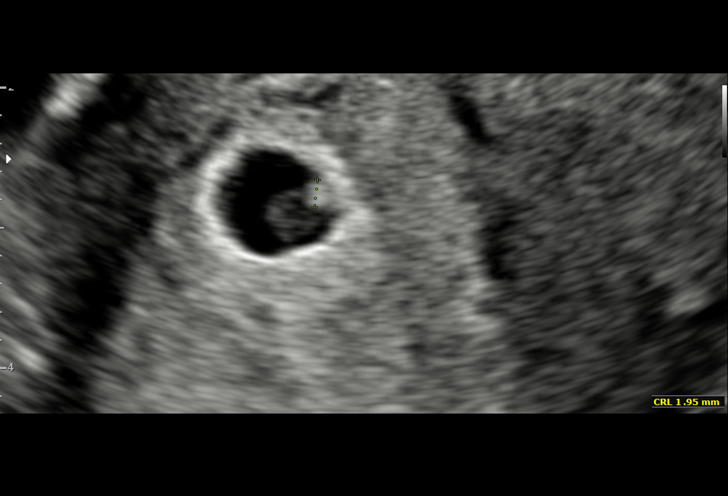
[im 57/74]
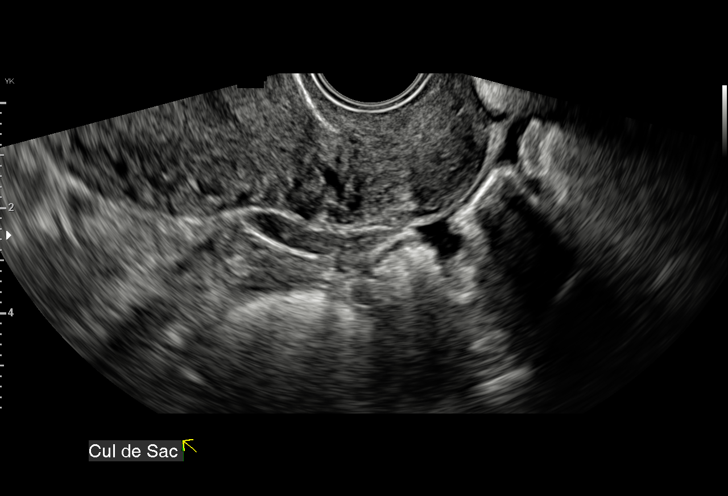
[im 63/74]
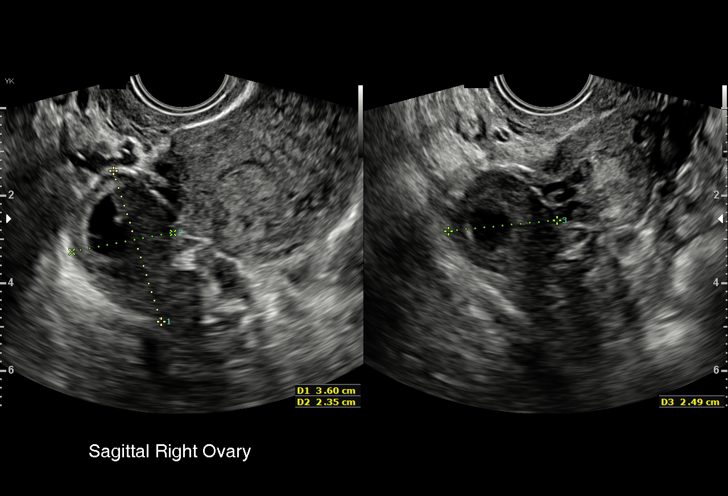
[im 68/74]
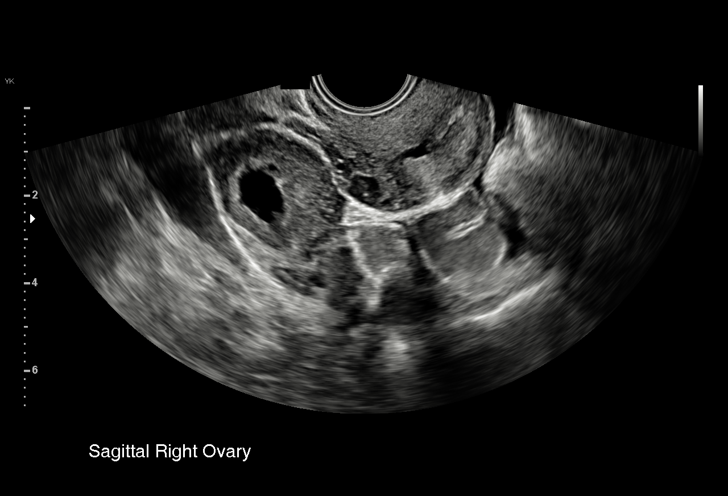
[im 74/74]
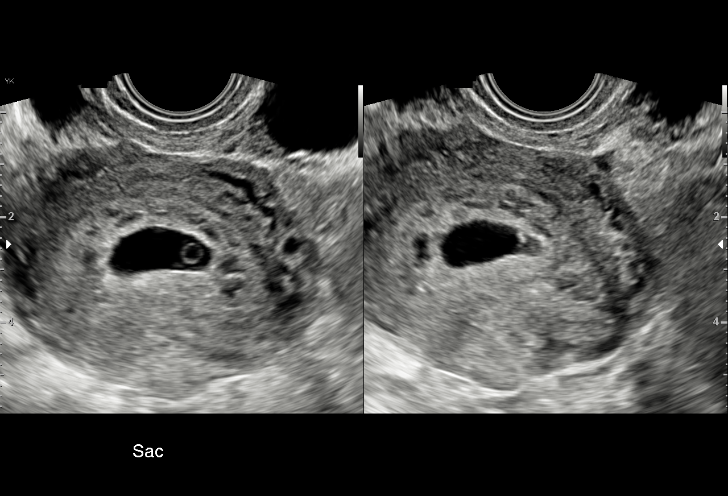

[15 of 28 positions shown; findings below may reference images not displayed]

FINDINGS: Intrauterine gestational sac: Single visualized.

Yolk sac:  Single.

Embryo:  Single.

Cardiac Activity: Single.

Heart Rate: 108 bpm

MSD: 13.6 mm   6 w   1 d

CRL:  7.1 mm   5 w   5 d                  US EDC: December 09, 2018.

Subchorionic hemorrhage:  None visualized.

Maternal uterus/adnexae: Probable corpus luteum cyst seen in right
ovary. Left ovary is unremarkable. Small amount of free fluid is
noted which most likely is physiologic.
IMPRESSION: Single live intrauterine gestation of 5 weeks 5 days.

## 2020-07-19 IMAGING — US US MFM OB DETAIL +14 WK
1 series · 13 of 28 positions shown · non-contrast
Comparison: none

[Series 1: us mfm ob detail +14 wk · 83 acquisitions, 13 frames shown]
[im 4/83]
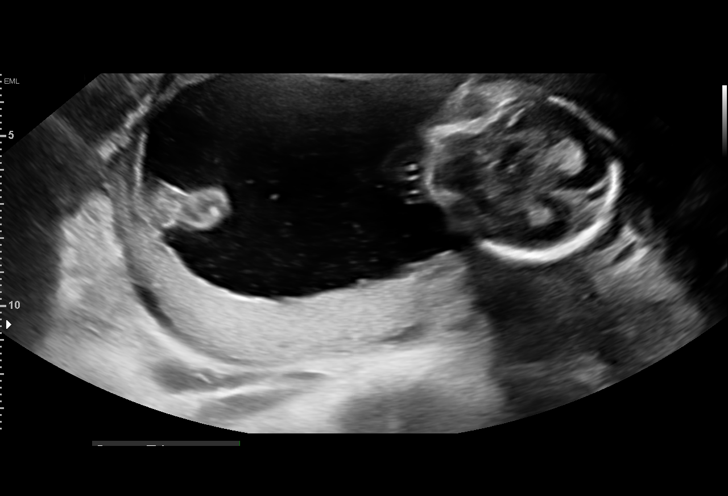
[im 10/83]
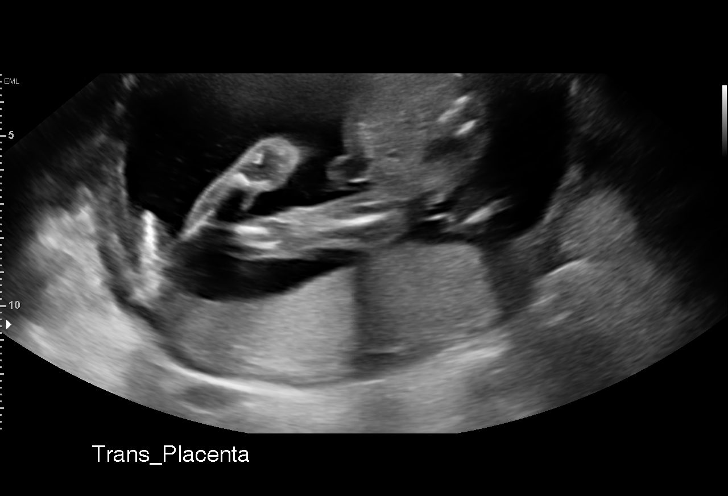
[im 16/83]
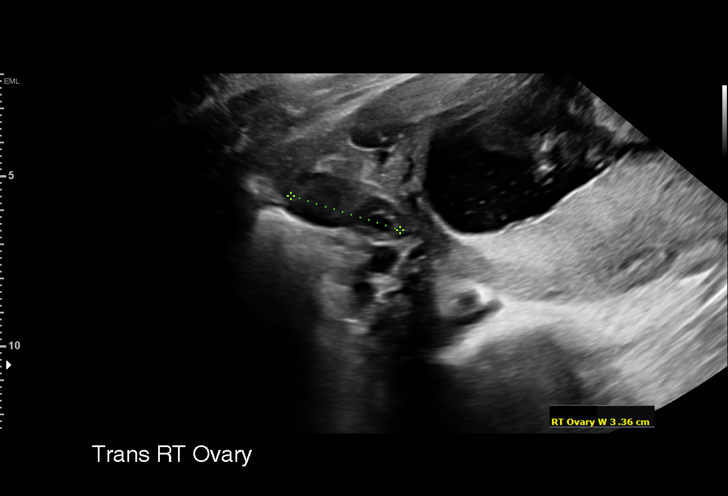
[im 22/83]
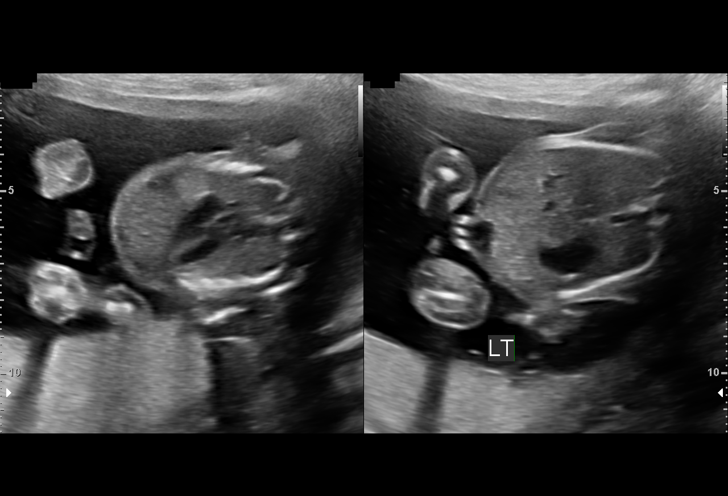
[im 28/83]
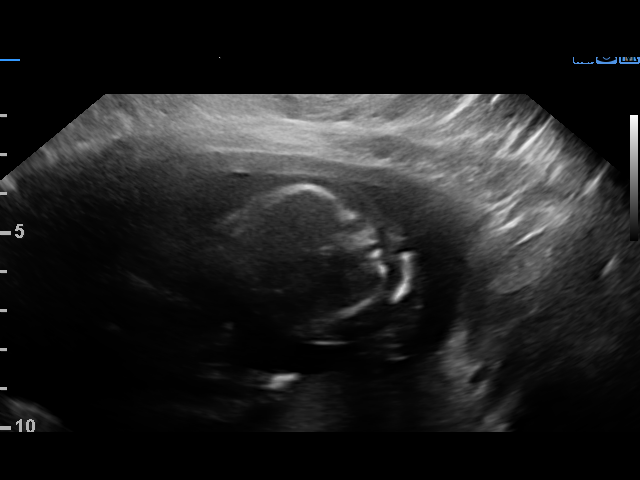
[im 34/83]
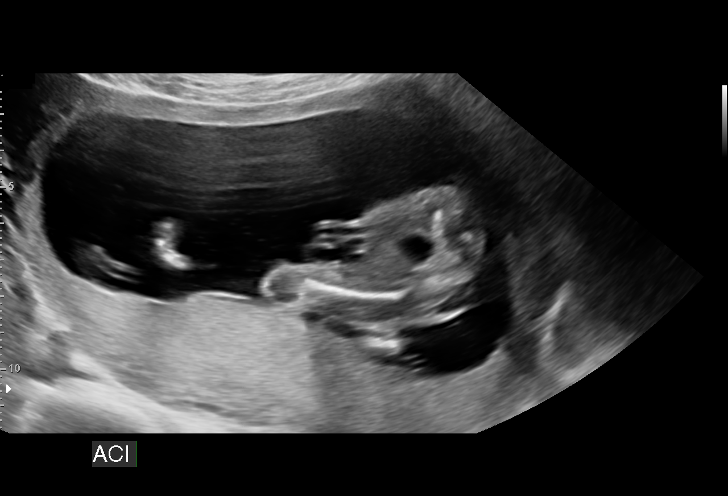
[im 43/83]
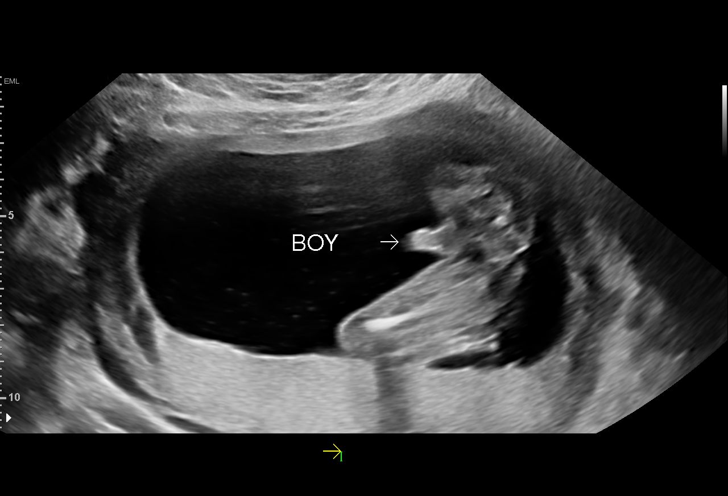
[im 49/83]
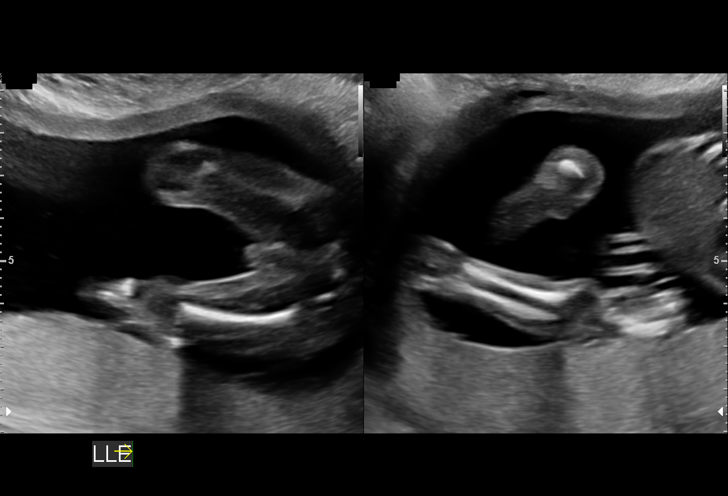
[im 55/83]
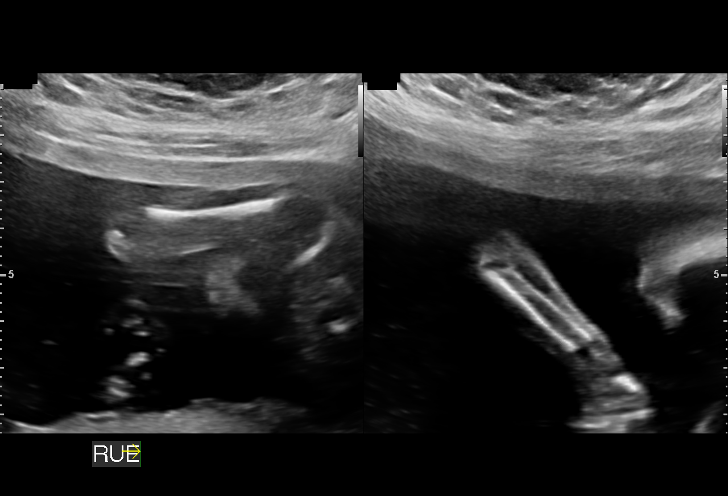
[im 61/83]
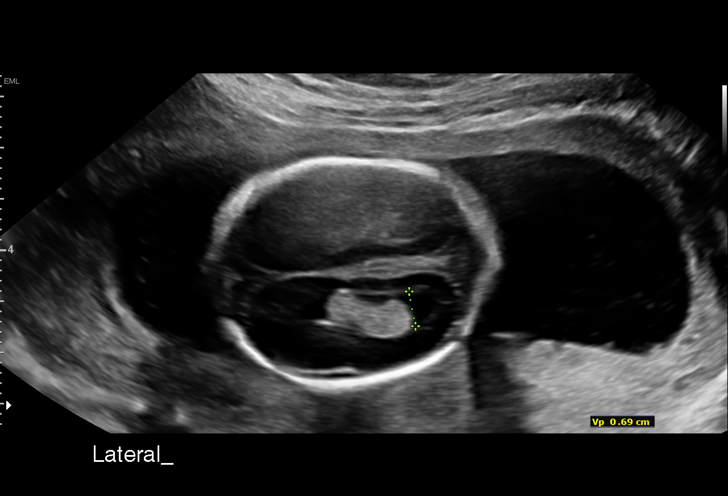
[im 67/83]
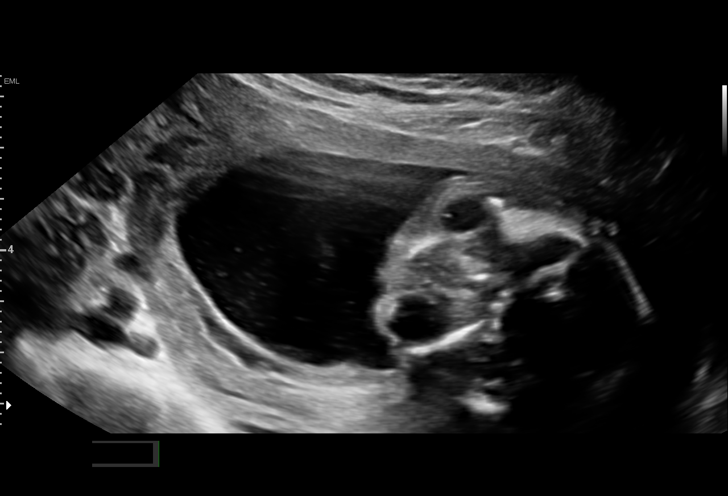
[im 73/83]
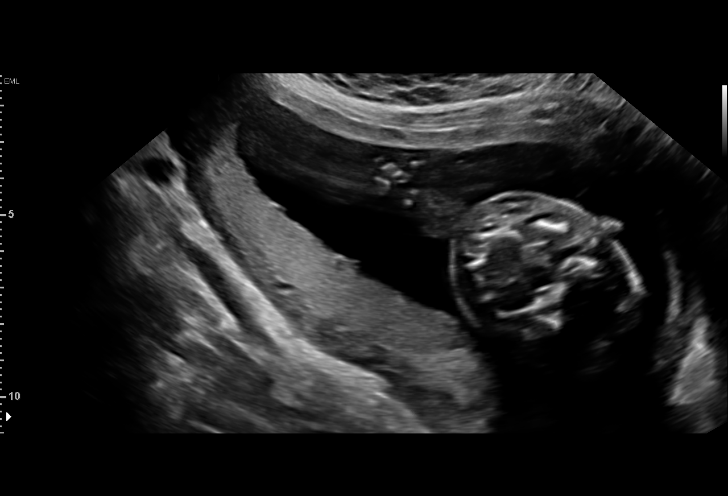
[im 79/83]
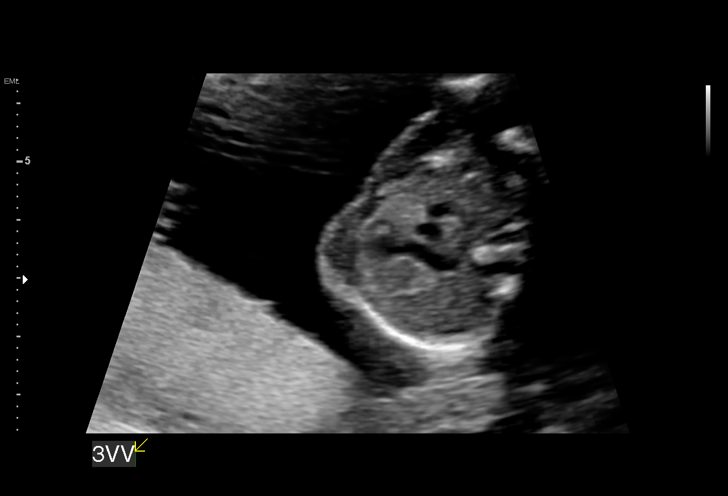

[13 of 28 positions shown; findings below may reference images not displayed]

----------------------------------------------------------------------

 ----------------------------------------------------------------------
Indications

  19 weeks gestation of pregnancy
  Encounter for antenatal screening for
  malformations (NIPS, low risk male)
  Maternal thalassemia complicating
  pregnancy in second trimester (alpha carrier)
  Previous cesarean delivery, antepartum
 ----------------------------------------------------------------------
Vital Signs

 BMI:
Fetal Evaluation

 Num Of Fetuses:         1
 Fetal Heart Rate(bpm):  151
 Cardiac Activity:       Observed
 Presentation:           Cephalic
 Placenta:               Posterior
 P. Cord Insertion:      Visualized, central

 Amniotic Fluid
 AFI FV:      Within normal limits

                             Largest Pocket(cm)

Biometry

 BPD:        43  mm     G. Age:  19w 0d         51  %    CI:        69.46   %    70 - 86
                                                         FL/HC:      17.9   %    16.1 -
 HC:      164.7  mm     G. Age:  19w 1d         52  %    HC/AC:      1.12        1.09 -
 AC:      147.3  mm     G. Age:  20w 0d         78  %    FL/BPD:     68.6   %
 FL:       29.5  mm     G. Age:  19w 1d         47  %    FL/AC:      20.0   %    20 - 24
 HUM:      28.1  mm     G. Age:  19w 0d         51  %
 CER:      19.7  mm     G. Age:  18w 6d         46  %
 NFT:       4.3  mm
 LV:        6.9  mm
 CM:        4.9  mm

 Est. FW:     298  gm    0 lb 11 oz      77  %
OB History

 Gravidity:    2         Term:   1        Prem:   0        SAB:   0
 TOP:          0       Ectopic:  0        Living: 1
Gestational Age

 LMP:           19w 0d        Date:  03/04/18                 EDD:   12/09/18
 U/S Today:     19w 2d                                        EDD:   12/07/18
 Best:          19w 0d     Det. By:  LMP  (03/04/18)          EDD:   12/09/18
Anatomy

 Cranium:               Appears normal         LVOT:                   Not well visualized
 Cavum:                 Appears normal         Aortic Arch:            Not well visualized
 Ventricles:            Appears normal         Ductal Arch:            Appears normal
 Choroid Plexus:        Appears normal         Diaphragm:              Appears normal
 Cerebellum:            Appears normal         Stomach:                Appears normal, left
                                                                       sided
 Posterior Fossa:       Appears normal         Abdomen:                Appears normal
 Nuchal Fold:           Appears normal         Abdominal Wall:         Appears nml (cord
                                                                       insert, abd wall)
 Face:                  Appears normal         Cord Vessels:           Appears normal (3
                        (orbits and profile)                           vessel cord)
 Lips:                  Appears normal         Kidneys:                Appear normal
 Palate:                Appears normal         Bladder:                Appears normal
 Thoracic:              Appears normal         Spine:                  Appears normal
 Heart:                 Appears normal         Upper Extremities:      Appears normal
                        (4CH, axis, and
                        situs)
 RVOT:                  Not well visualized    Lower Extremities:      Appears normal

 Other:  Fetus appears to be a male. Nasal bone visualized. Heels and 5th
         digit visualized.
Cervix Uterus Adnexa

 Cervix
 Length:           3.64  cm.
 Normal appearance by transabdominal scan.

 Left Ovary
 Within normal limits.

 Right Ovary
 Small corpus luteum noted.

 Adnexa
 No abnormality visualized.
Impression

 We performed fetal anatomy scan. No makers of
 aneuploidies or fetal structural defects are seen. Fetal
 biometry is consistent with her previously-established dates.
 Amniotic fluid is normal and good fetal activity is seen.
 Patient understands the limitations of ultrasound in detecting
 fetal anomalies.

 On cell-free fetal DNA screening, the risks of fetal
 aneuploidies are not increased.

 Ms. Celestino is a silent carrier of alpha thalassemia. I
 explained the genetics behind transmission. I encouraged her
 to get her partner screened. If he is also a carrier of alpha
 thalassemia (with one deletion), the fetus will be a carrier and
 will not be affected. I also informed her that prenatal
 diagnosis is possible by amniocentesis.
Recommendations

 An appointment was made for her to return in 4 weeks for
 completion of fetal anatomy.
                 Loraz[place], [place]

## 2020-11-09 ENCOUNTER — Other Ambulatory Visit: Payer: Self-pay

## 2020-11-09 ENCOUNTER — Encounter: Payer: Self-pay | Admitting: Obstetrics and Gynecology

## 2020-11-09 ENCOUNTER — Ambulatory Visit: Payer: Medicaid Other | Admitting: Obstetrics and Gynecology

## 2020-11-09 ENCOUNTER — Other Ambulatory Visit (HOSPITAL_COMMUNITY)
Admission: RE | Admit: 2020-11-09 | Discharge: 2020-11-09 | Disposition: A | Discharge status: Home / Self Care | Payer: Medicaid Other | Source: Ambulatory Visit | Attending: Obstetrics and Gynecology | Admitting: Obstetrics and Gynecology

## 2020-11-09 VITALS — BP 115/74 | HR 94 | Wt 165.0 lb

## 2020-11-09 DIAGNOSIS — N939 Abnormal uterine and vaginal bleeding, unspecified: Secondary | ICD-10-CM

## 2020-11-09 NOTE — Progress Notes (Signed)
RGYN patient presents for problem visit today patient had medical abortion using Cytotec on 10/28/20 and 10/29/20.   CC: now light vaginal bleeding and odor. Pt notes cramping as well.   Last pap: 02/26/2018 WNL

## 2020-11-09 NOTE — Progress Notes (Signed)
25 yo P2 here for evaluation of vaginitis. Patient had a medical abortion on 10/12-10/13 and reports persistent light vaginal bleeding with cramping and the presence of a vaginal odor. Patient denies any fevers. She reports vaginal bleeding heavier than a period for a few days. Patient is without any other complaints. She was provided with contraception which she plans on starting this week.  Past Medical History:  Diagnosis Date   Anemia    Cervical dysplasia 02/15/2017   02/2018: pap neg   Chlamydia    History of C-section 11/28/2018   History of cesarean delivery 05/16/2018   2014 pLTCS for persistent anterior lip. D/w her that she is eligible for tolac. Can d/w her later in pregnancy    History of cesarean delivery, currently pregnant 11/28/2018   Infection    UTI   Supervision of other normal pregnancy, antepartum 05/16/2018    Nursing Staff Provider Office Location  [place] Dating   edc 11/23, lmp=5wk u/s Language  English  Anatomy US  [x ] f/u completion u/s: neg Flu Vaccine  09/10/2018 Genetic Screen  NIPS: low risk female   AFP:   TDaP vaccine   09/10/2018 Hgb A1C or  GTT Early  Third trimester: NEG Rhogam     LAB RESULTS  Feeding Plan Breast/Bottle Blood Type   A pos Contraception undecided Antibody  neg Circumcision Yes    Past Surgical History:  Procedure Laterality Date   CESAREAN SECTION N/A 04/05/2012   Procedure: CESAREAN SECTION;  Surgeon: Tilda Burrow, MD;  Location: [facility];  Service: Obstetrics;  Laterality: N/A;   CESAREAN SECTION WITH BILATERAL TUBAL LIGATION N/A 11/28/2018   Procedure: cesarean section;  Surgeon: Hermina Staggers, MD;  Location: [facility];  Service: Obstetrics;  Laterality: N/A;   Family History  Problem Relation Age of Onset   Hypertension Mother    Hypertension Father    Cancer Maternal Grandfather    Social History   Tobacco Use   Smoking status: Never   Smokeless tobacco: Never  Vaping Use   Vaping Use: Never used  Substance Use Topics   Alcohol  use: Not Currently    Comment: twice a month   Drug use: No   ROS See pertinent in HPI. All other systems reviewed and non contributory  Blood pressure 115/74, pulse 94, weight 165 lb (74.8 kg), unknown if currently breastfeeding. GENERAL: Well-developed, well-nourished female in no acute distress.  ABDOMEN: Soft, nontender, nondistended. No organomegaly. PELVIC: Normal external female genitalia. Vagina is pink and rugated.  Normal discharge. Small amount of bright red blood in vault. Normal appearing cervix. Uterus is normal in size. No adnexal mass or tenderness. Chaperone present during the pelvic exam EXTREMITIES: No cyanosis, clubbing, or edema, 2+ distal pulses.  A/P 25 yo with AUB following medical abortion - Reassurance provided - Pelvic ultrasound ordered to rule out retained POC - vaginal swab collected to rule out BV  - Patient will be contacted with abnormal results - patient had a normal pap smear in 2020

## 2020-11-11 ENCOUNTER — Ambulatory Visit
Admission: RE | Admit: 2020-11-11 | Discharge: 2020-11-11 | Disposition: A | Discharge status: Home / Self Care | Payer: Medicaid Other | Source: Ambulatory Visit | Attending: Obstetrics and Gynecology | Admitting: Obstetrics and Gynecology

## 2020-11-11 ENCOUNTER — Other Ambulatory Visit: Payer: Self-pay

## 2020-11-11 DIAGNOSIS — N888 Other specified noninflammatory disorders of cervix uteri: Secondary | ICD-10-CM | POA: Diagnosis not present

## 2020-11-11 DIAGNOSIS — N939 Abnormal uterine and vaginal bleeding, unspecified: Secondary | ICD-10-CM | POA: Diagnosis not present

## 2020-11-11 LAB — CERVICOVAGINAL ANCILLARY ONLY
Bacterial Vaginitis (gardnerella): POSITIVE — AB
Candida Glabrata: NEGATIVE
Candida Vaginitis: NEGATIVE
Comment: NEGATIVE
Comment: NEGATIVE
Comment: NEGATIVE

## 2020-11-11 MED ORDER — METRONIDAZOLE 0.75 % VA GEL: 1.0000 | Freq: Every day | VAGINAL | 1 refills | Status: DC

## 2020-11-11 NOTE — Addendum Note (Signed)
Addended by: Catalina Antigua on: 11/11/2020 12:49 PM   Modules accepted: Orders

## 2020-11-15 ENCOUNTER — Other Ambulatory Visit: Payer: Self-pay | Admitting: Obstetrics and Gynecology

## 2020-11-15 ENCOUNTER — Telehealth: Payer: Self-pay

## 2020-11-15 ENCOUNTER — Telehealth (INDEPENDENT_AMBULATORY_CARE_PROVIDER_SITE_OTHER): Payer: Medicaid Other | Admitting: Obstetrics and Gynecology

## 2020-11-15 DIAGNOSIS — Z712 Person consulting for explanation of examination or test findings: Secondary | ICD-10-CM

## 2020-11-15 DIAGNOSIS — O074 Failed attempted termination of pregnancy without complication: Secondary | ICD-10-CM

## 2020-11-15 MED ORDER — MISOPROSTOL 200 MCG PO TABS: ORAL_TABLET | ORAL | 1 refills | Status: DC

## 2020-11-15 NOTE — Telephone Encounter (Signed)
Message below copy and paste from Dr.Constant staff message regarding pt U/S results. Pt was notified and voiced understanding. Will forward message to scheduling for U/S and Follow/up visit .    "Please inform patient that ultrasound is suggestive of retained tissue from her pregnancy. I have sent a prescription for cytotec to her pharmacy. I would like for her to follow up in a week from taking the pills with an ultrasound (order in Epic). We can schedule an in person or virtual visit to discuss the results of that ultrasound "  Thanks   [employer]

## 2020-11-15 NOTE — Telephone Encounter (Addendum)
    GYNECOLOGY VIRTUAL VISIT ENCOUNTER NOTE  Provider location: [facility]   Patient location: Home  I connected with Shirley Greene by telephone Encounter and verified that I am speaking with the correct person using two identifiers.   I discussed the limitations, risks, security and privacy concerns of performing an evaluation and management service virtually and the availability of in person appointments. I also discussed with the patient that there may be a patient responsible charge related to this service. The patient expressed understanding and agreed to proceed.    25 yo with retained POC following a medication induced medical termination of pregnancy in mid October. Patient was contacted by phone to go over ultrasound results and treatment options. Ultrasound findings were reviewed with the patient. Patient reports persistent light vaginal bleeding unchanged from her last visit  US PELVIC COMPLETE WITH TRANSVAGINAL  Result Date: 11/11/2020 CLINICAL DATA:  Abnormal uterine bleeding since 10/29/2020 question retained products of conception, post therapeutic abortion 10/28/2020. Past history of Caesarean section EXAM: TRANSABDOMINAL AND TRANSVAGINAL ULTRASOUND OF PELVIS TECHNIQUE: Both transabdominal and transvaginal ultrasound examinations of the pelvis were performed. Transabdominal technique was performed for global imaging of the pelvis including uterus, ovaries, adnexal regions, and pelvic cul-de-sac. It was necessary to proceed with endovaginal exam following the transabdominal exam to visualize the endometrium and ovaries. COMPARISON:  None FINDINGS: Uterus Measurements: 6.3 x 4.7 x 5.7 cm = volume: 87 mL. Anteverted. Normal morphology without mass. Nabothian cysts at cervix. Endometrium Thickness: Suboptimally delineated. Heterogeneous thickened appearance though ill-defined. Trace fluid. Areas of slightly for more focal heterogeneous increased  echogenicity are seen at the posterior aspect of the upper uterine segment endometrium, 3.1 x 1.3 x 2.2 cm, without definite internal blood flow on color Doppler imaging. Retained products of conception not excluded. Right ovary Measurements: 4.3 x 1.9 x 2.0 cm = volume: 8.5 mL. Normal morphology without mass Left ovary Measurements: 2.4 x 1.6 x 2.9 cm = volume: 5.9 mL. Normal morphology without mass Other findings Trace free pelvic fluid.  No adnexal masses. IMPRESSION: Normal appearing ovaries. Heterogeneous ill-defined endometrial complex with a more focal area of abnormal echogenicity 3.1 x 1.3 x 2.2 cm at posterior aspect of the upper uterine segment endometrium, without definite internal blood flow on color Doppler imaging; retained products of conception not excluded. These results will be called to the ordering clinician or representative by the Radiologist Assistant, and communication documented in the PACS or [employer]. Electronically Signed   By: Ulyses Southward M.D.   On: 11/11/2020 16:50     Discussed medical management with cytotec vs D&C. Risks, benefits and alternatives were explained for both including but not limited to risks of bleeding, infection and damage to adjacent organs. Patient verbalized understanding and opted for medical management with cytotec. Plan for repeat ultrasound in 1 week.  A total of 15 minutes was spent counseling the patient

## 2020-11-16 ENCOUNTER — Other Ambulatory Visit: Payer: Self-pay | Admitting: Obstetrics and Gynecology

## 2020-11-16 DIAGNOSIS — O074 Failed attempted termination of pregnancy without complication: Secondary | ICD-10-CM

## 2020-11-16 NOTE — Progress Notes (Signed)
Patient sent MyChart message indicating that she did not have any vaginal bleeding following Cytotec administration. Patient desires to proceed with D&E as she was counseled on 11/15/20.

## 2020-11-18 ENCOUNTER — Other Ambulatory Visit (HOSPITAL_COMMUNITY): Payer: Self-pay | Admitting: Obstetrics & Gynecology

## 2020-11-18 ENCOUNTER — Encounter (HOSPITAL_COMMUNITY): Payer: Self-pay | Admitting: Obstetrics & Gynecology

## 2020-11-18 ENCOUNTER — Other Ambulatory Visit: Payer: Self-pay

## 2020-11-18 ENCOUNTER — Other Ambulatory Visit: Payer: Self-pay | Admitting: Obstetrics & Gynecology

## 2020-11-18 ENCOUNTER — Telehealth: Payer: Self-pay

## 2020-11-18 DIAGNOSIS — O074 Failed attempted termination of pregnancy without complication: Secondary | ICD-10-CM

## 2020-11-18 NOTE — Progress Notes (Signed)
Spoke with pt for pre-op call. She denies any cardiac history. Pt states she is not Diabetic.   Pt's surgery is scheduled as ambulatory so no Covid test is required prior to surgery.

## 2020-11-18 NOTE — Telephone Encounter (Signed)
Called and spoke to patient, surgery date/time and preop instructions given, patient expressed understanding.

## 2020-11-19 ENCOUNTER — Encounter (HOSPITAL_COMMUNITY): Payer: Self-pay | Admitting: Obstetrics & Gynecology

## 2020-11-19 ENCOUNTER — Ambulatory Visit (HOSPITAL_COMMUNITY): Payer: Medicaid Other | Admitting: Anesthesiology

## 2020-11-19 ENCOUNTER — Ambulatory Visit (HOSPITAL_COMMUNITY)
Admission: RE | Admit: 2020-11-19 | Discharge: 2020-11-19 | Disposition: A | Discharge status: Home / Self Care | Payer: Medicaid Other | Source: Ambulatory Visit | Attending: Obstetrics & Gynecology | Admitting: Obstetrics & Gynecology

## 2020-11-19 ENCOUNTER — Encounter (HOSPITAL_COMMUNITY)
Admission: RE | Disposition: A | Discharge status: Home / Self Care | Payer: Self-pay | Source: Home / Self Care | Attending: Obstetrics & Gynecology

## 2020-11-19 ENCOUNTER — Ambulatory Visit (HOSPITAL_COMMUNITY)
Admission: RE | Admit: 2020-11-19 | Discharge: 2020-11-19 | Disposition: A | Discharge status: Home / Self Care | Payer: Medicaid Other | Attending: Obstetrics & Gynecology | Admitting: Obstetrics & Gynecology

## 2020-11-19 DIAGNOSIS — Z793 Long term (current) use of hormonal contraceptives: Secondary | ICD-10-CM | POA: Diagnosis not present

## 2020-11-19 DIAGNOSIS — O074 Failed attempted termination of pregnancy without complication: Secondary | ICD-10-CM

## 2020-11-19 DIAGNOSIS — D62 Acute posthemorrhagic anemia: Secondary | ICD-10-CM | POA: Diagnosis not present

## 2020-11-19 DIAGNOSIS — O034 Incomplete spontaneous abortion without complication: Secondary | ICD-10-CM | POA: Diagnosis not present

## 2020-11-19 DIAGNOSIS — O9903 Anemia complicating the puerperium: Secondary | ICD-10-CM | POA: Diagnosis not present

## 2020-11-19 HISTORY — PX: DILATION AND EVACUATION: SHX1459

## 2020-11-19 HISTORY — PX: OPERATIVE ULTRASOUND: SHX5996

## 2020-11-19 LAB — TYPE AND SCREEN
ABO/RH(D): A POS
Antibody Screen: NEGATIVE

## 2020-11-19 LAB — CBC
HCT: 35.6 % — ABNORMAL LOW (ref 36.0–46.0)
Hemoglobin: 11 g/dL — ABNORMAL LOW (ref 12.0–15.0)
MCH: 23.1 pg — ABNORMAL LOW (ref 26.0–34.0)
MCHC: 30.9 g/dL (ref 30.0–36.0)
MCV: 74.8 fL — ABNORMAL LOW (ref 80.0–100.0)
Platelets: 362 10*3/uL (ref 150–400)
RBC: 4.76 MIL/uL (ref 3.87–5.11)
RDW: 16.6 % — ABNORMAL HIGH (ref 11.5–15.5)
WBC: 8.3 10*3/uL (ref 4.0–10.5)
nRBC: 0 % (ref 0.0–0.2)

## 2020-11-19 SURGERY — DILATION AND EVACUATION, UTERUS
Anesthesia: General | Site: Uterus

## 2020-11-19 MED ORDER — SILVER NITRATE-POT NITRATE 75-25 % EX MISC
CUTANEOUS | Status: AC
  Filled 2020-11-19: qty 10

## 2020-11-19 MED ORDER — DEXAMETHASONE SODIUM PHOSPHATE 10 MG/ML IJ SOLN
INTRAMUSCULAR | Status: AC
  Filled 2020-11-19: qty 3

## 2020-11-19 MED ORDER — ONDANSETRON HCL 4 MG/2ML IJ SOLN: 4.0000 mg | Freq: Once | INTRAMUSCULAR | Status: DC | PRN

## 2020-11-19 MED ORDER — ORAL CARE MOUTH RINSE: 15.0000 mL | Freq: Once | OROMUCOSAL | Status: AC

## 2020-11-19 MED ORDER — KETOROLAC TROMETHAMINE 30 MG/ML IJ SOLN
INTRAMUSCULAR | Status: DC | PRN
  Administered 2020-11-19: 30 mg via INTRAVENOUS

## 2020-11-19 MED ORDER — LIDOCAINE 2% (20 MG/ML) 5 ML SYRINGE
INTRAMUSCULAR | Status: DC | PRN
  Administered 2020-11-19: 60 mg via INTRAVENOUS

## 2020-11-19 MED ORDER — KETOROLAC TROMETHAMINE 30 MG/ML IJ SOLN
INTRAMUSCULAR | Status: AC
  Filled 2020-11-19: qty 1

## 2020-11-19 MED ORDER — FENTANYL CITRATE (PF) 250 MCG/5ML IJ SOLN
INTRAMUSCULAR | Status: DC | PRN
  Administered 2020-11-19: 100 ug via INTRAVENOUS

## 2020-11-19 MED ORDER — FENTANYL CITRATE (PF) 250 MCG/5ML IJ SOLN
INTRAMUSCULAR | Status: AC
  Filled 2020-11-19: qty 5

## 2020-11-19 MED ORDER — AMISULPRIDE (ANTIEMETIC) 5 MG/2ML IV SOLN: 10.0000 mg | Freq: Once | INTRAVENOUS | Status: DC | PRN

## 2020-11-19 MED ORDER — POVIDONE-IODINE 10 % EX SWAB
2.0000 "application " | Freq: Once | CUTANEOUS | Status: AC
  Administered 2020-11-19: 2 via TOPICAL

## 2020-11-19 MED ORDER — ONDANSETRON HCL 4 MG/2ML IJ SOLN
INTRAMUSCULAR | Status: AC
  Filled 2020-11-19: qty 8

## 2020-11-19 MED ORDER — CHLORHEXIDINE GLUCONATE 0.12 % MT SOLN: 15.0000 mL | Freq: Once | OROMUCOSAL | Status: AC

## 2020-11-19 MED ORDER — CHLORHEXIDINE GLUCONATE 0.12 % MT SOLN
OROMUCOSAL | Status: AC
  Administered 2020-11-19: 15 mL via OROMUCOSAL
  Filled 2020-11-19: qty 15

## 2020-11-19 MED ORDER — LACTATED RINGERS IV SOLN: INTRAVENOUS | Status: DC

## 2020-11-19 MED ORDER — DOXYCYCLINE HYCLATE 100 MG IV SOLR
200.0000 mg | INTRAVENOUS | Status: AC
  Administered 2020-11-19: 200 mg via INTRAVENOUS
  Filled 2020-11-19: qty 200

## 2020-11-19 MED ORDER — ONDANSETRON HCL 4 MG/2ML IJ SOLN
INTRAMUSCULAR | Status: DC | PRN
  Administered 2020-11-19: 4 mg via INTRAVENOUS

## 2020-11-19 MED ORDER — MIDAZOLAM HCL 2 MG/2ML IJ SOLN
INTRAMUSCULAR | Status: AC
  Filled 2020-11-19: qty 2

## 2020-11-19 MED ORDER — FENTANYL CITRATE (PF) 100 MCG/2ML IJ SOLN: 25.0000 ug | INTRAMUSCULAR | Status: DC | PRN

## 2020-11-19 MED ORDER — OXYCODONE HCL 5 MG/5ML PO SOLN: 5.0000 mg | Freq: Once | ORAL | Status: DC | PRN

## 2020-11-19 MED ORDER — PROPOFOL 10 MG/ML IV BOLUS
INTRAVENOUS | Status: AC
  Filled 2020-11-19: qty 20

## 2020-11-19 MED ORDER — PROPOFOL 10 MG/ML IV BOLUS
INTRAVENOUS | Status: DC | PRN
  Administered 2020-11-19: 180 mg via INTRAVENOUS

## 2020-11-19 MED ORDER — OXYCODONE HCL 5 MG PO TABS: 5.0000 mg | ORAL_TABLET | Freq: Once | ORAL | Status: DC | PRN

## 2020-11-19 MED ORDER — MIDAZOLAM HCL 5 MG/5ML IJ SOLN
INTRAMUSCULAR | Status: DC | PRN
  Administered 2020-11-19: 2 mg via INTRAVENOUS

## 2020-11-19 MED ORDER — DEXAMETHASONE SODIUM PHOSPHATE 10 MG/ML IJ SOLN
INTRAMUSCULAR | Status: DC | PRN
  Administered 2020-11-19: 4 mg via INTRAVENOUS

## 2020-11-19 SURGICAL SUPPLY — 21 items
CATH ROBINSON RED A/P 16FR (CATHETERS) ×3 IMPLANT
DECANTER SPIKE VIAL GLASS SM (MISCELLANEOUS) ×3 IMPLANT
FILTER UTR ASPR ASSEMBLY (MISCELLANEOUS) ×3 IMPLANT
GAUZE 4X4 16PLY ~~LOC~~+RFID DBL (SPONGE) ×3 IMPLANT
GLOVE SURG ENC MOIS LTX SZ6.5 (GLOVE) ×3 IMPLANT
GLOVE SURG UNDER POLY LF SZ7 (GLOVE) ×6 IMPLANT
GOWN STRL REUS W/ TWL LRG LVL3 (GOWN DISPOSABLE) ×4 IMPLANT
GOWN STRL REUS W/TWL LRG LVL3 (GOWN DISPOSABLE) ×2
HOSE CONNECTING 18IN BERKELEY (TUBING) ×3 IMPLANT
KIT BERKELEY 1ST TRI 3/8 NO TR (MISCELLANEOUS) ×3 IMPLANT
KIT BERKELEY 1ST TRIMESTER 3/8 (MISCELLANEOUS) ×3 IMPLANT
NS IRRIG 1000ML POUR BTL (IV SOLUTION) ×3 IMPLANT
PACK VAGINAL MINOR WOMEN LF (CUSTOM PROCEDURE TRAY) ×3 IMPLANT
PAD OB MATERNITY 4.3X12.25 (PERSONAL CARE ITEMS) ×3 IMPLANT
SET BERKELEY SUCTION TUBING (SUCTIONS) ×3 IMPLANT
TOWEL GREEN STERILE FF (TOWEL DISPOSABLE) ×6 IMPLANT
UNDERPAD 30X36 HEAVY ABSORB (UNDERPADS AND DIAPERS) ×3 IMPLANT
VACURETTE 10 RIGID CVD (CANNULA) IMPLANT
VACURETTE 7MM CVD STRL WRAP (CANNULA) ×3 IMPLANT
VACURETTE 8 RIGID CVD (CANNULA) IMPLANT
VACURETTE 9 RIGID CVD (CANNULA) IMPLANT

## 2020-11-19 NOTE — Transfer of Care (Signed)
Immediate Anesthesia Transfer of Care Note  Patient: Shirley Greene  Procedure(s) Performed: DILATATION AND EVACUATION (Uterus) OPERATIVE ULTRASOUND (Abdomen)  Patient Location: PACU  Anesthesia Type:General  Level of Consciousness: drowsy and patient cooperative  Airway & Oxygen Therapy: Patient Spontanous Breathing  Post-op Assessment: Report given to RN and Post -op Vital signs reviewed and stable  Post vital signs: Reviewed and stable  Last Vitals:  Vitals Value Taken Time  BP 110/65 11/19/20 1422  Temp    Pulse 98 11/19/20 1424  Resp 22 11/19/20 1424  SpO2 100 % 11/19/20 1424  Vitals shown include unvalidated device data.  Last Pain:  Vitals:   11/19/20 1132  TempSrc:   PainSc: 0-No pain         Complications: No notable events documented.

## 2020-11-19 NOTE — Anesthesia Procedure Notes (Signed)
Procedure Name: LMA Insertion Date/Time: 11/19/2020 1:58 PM Performed by: Adria Dill, CRNA Pre-anesthesia Checklist: Patient identified, Emergency Drugs available, Suction available and Patient being monitored Patient Re-evaluated:Patient Re-evaluated prior to induction Oxygen Delivery Method: Circle system utilized Preoxygenation: Pre-oxygenation with 100% oxygen Induction Type: IV induction Ventilation: Mask ventilation without difficulty LMA: LMA inserted LMA Size: 4.0 Number of attempts: 1 Placement Confirmation: positive ETCO2 and breath sounds checked- equal and bilateral Tube secured with: Tape Dental Injury: Teeth and Oropharynx as per pre-operative assessment

## 2020-11-19 NOTE — H&P (Signed)
Faculty Practice Obstetrics and Gynecology Attending History and Physical  Shirley Greene is a 25 y.o. [id number] who presents for scheduled D&E due to retained products of conception. She reports persistent vaginal bleeding and pelvic pain.  [place] completed and confirmed retained products.  Denies any abnormal vaginal discharge, fevers, chills, sweats, dysuria, nausea, vomiting, other GI or GU symptoms or other general symptoms.  Past Medical History:  Diagnosis Date   Anemia    Cervical dysplasia 02/15/2017   02/2018: pap neg   Chlamydia    History of C-section x2 11/28/2018   Past Surgical History:  Procedure Laterality Date   CESAREAN SECTION N/A 04/05/2012   Procedure: CESAREAN SECTION;  Surgeon: Tilda Burrow, MD;  Location: [facility];  Service: Obstetrics;  Laterality: N/A;   CESAREAN SECTION WITH BILATERAL TUBAL LIGATION N/A 11/28/2018   Procedure: cesarean section;  Surgeon: Hermina Staggers, MD;  Location: [facility];  Service: Obstetrics;  Laterality: N/A;   OB History  Gravida Para Term Preterm AB Living  2 2 2     2   SAB IAB Ectopic Multiple Live Births        0 2    # Outcome Date GA Lbr Len/2nd Weight Sex Delivery Anes PTL Lv  2 Term 11/28/18 [redacted]w[redacted]d  3810 g M CS-LTranv Spinal  LIV  1 Term 04/05/12 [redacted]w[redacted]d  3595 g M CS-LTranv EPI  LIV    Obstetric Comments  "OP" presentation, got stuck at 9cm  Patient denies any other pertinent gynecologic issues.  No current facility-administered medications on file prior to encounter.   Current Outpatient Medications on File Prior to Encounter  Medication Sig Dispense Refill   acetaminophen (TYLENOL) 500 MG tablet Take 1,000 mg by mouth every 6 (six) hours as needed for mild pain or moderate pain.     metroNIDAZOLE (METROGEL) 0.75 % vaginal gel Place 1 Applicatorful vaginally at bedtime. Apply one applicatorful to vagina at bedtime for 5 days 70 g 1   norelgestromin-ethinyl estradiol (ORTHO EVRA) 150-35 MCG/24HR transdermal patch Place 1  patch onto the skin once a week. (Patient taking differently: Place 1 patch onto the skin once a week. Zafemy) 3 patch 15   No Known Allergies  Social History:   reports that she has never smoked. She has never used smokeless tobacco. She reports that she does not currently use alcohol. She reports that she does not use drugs. Family History  Problem Relation Age of Onset   Hypertension Mother    Hypertension Father    Cancer Maternal Grandfather     Review of Systems: Pertinent items noted in HPI and remainder of comprehensive ROS otherwise negative.  PHYSICAL EXAM: unknown if currently breastfeeding. CONSTITUTIONAL: Well-developed, well-nourished female in no acute distress.  HENT:  Normocephalic, atraumatic, External right and left ear normal. Oropharynx is clear and moist NECK: Normal range of motion, supple, no masses SKIN: Skin is warm and dry. No rash noted. Not diaphoretic. No erythema. No pallor. NEUROLOGIC: Alert and oriented to person, place, and time. Normal reflexes, muscle tone coordination. No cranial nerve deficit noted. PSYCHIATRIC: Normal mood and affect. Normal behavior. Normal judgment and thought content. CARDIOVASCULAR: Normal heart rate noted, regular rhythm RESPIRATORY: Effort and breath sounds normal, no problems with respiration noted ABDOMEN: Soft, nontender, nondistended. PELVIC: deferred MUSCULOSKELETAL: Normal range of motion. No tenderness.  No cyanosis, clubbing, or edema.  2+ distal pulses.  Labs: Results for orders placed or performed in visit on 11/09/20 (from the past 336 hour(s))  Cervicovaginal ancillary only( [place])   Collection Time: 11/09/20  2:09 PM  Result Value Ref Range   Bacterial Vaginitis (gardnerella) Positive (A)    Candida Vaginitis Negative    Candida Glabrata Negative    Comment      Normal Reference Range Bacterial Vaginosis - Negative   Comment Normal Reference Range Candida Species - Negative    Comment Normal  Reference Range Candida Galbrata - Negative     Imaging Studies: US PELVIC COMPLETE WITH TRANSVAGINAL  Result Date: 11/11/2020 CLINICAL DATA:  Abnormal uterine bleeding since 10/29/2020 question retained products of conception, post therapeutic abortion 10/28/2020. Past history of Caesarean section EXAM: TRANSABDOMINAL AND TRANSVAGINAL ULTRASOUND OF PELVIS TECHNIQUE: Both transabdominal and transvaginal ultrasound examinations of the pelvis were performed. Transabdominal technique was performed for global imaging of the pelvis including uterus, ovaries, adnexal regions, and pelvic cul-de-sac. It was necessary to proceed with endovaginal exam following the transabdominal exam to visualize the endometrium and ovaries. COMPARISON:  None   FINDINGS: Uterus Measurements: 6.3 x 4.7 x 5.7 cm = volume: 87 mL. Anteverted. Normal morphology without mass. Nabothian cysts at cervix. Endometrium Thickness: Suboptimally delineated. Heterogeneous thickened appearance though ill-defined. Trace fluid. Areas of slightly for more focal heterogeneous increased echogenicity are seen at the posterior aspect of the upper uterine segment endometrium, 3.1 x 1.3 x 2.2 cm, without definite internal blood flow on color Doppler imaging. Retained products of conception not excluded. Right ovary Measurements: 4.3 x 1.9 x 2.0 cm = volume: 8.5 mL. Normal morphology without mass Left ovary Measurements: 2.4 x 1.6 x 2.9 cm = volume: 5.9 mL. Normal morphology without mass Other findings Trace free pelvic fluid.  No adnexal masses. IMPRESSION: Normal appearing ovaries. Heterogeneous ill-defined endometrial complex with a more focal area of abnormal echogenicity 3.1 x 1.3 x 2.2 cm at posterior aspect of the upper uterine segment endometrium, without definite internal blood flow on color Doppler imaging; retained products of conception not excluded. These results will be called to the ordering clinician or representative by the Radiologist  Assistant, and communication documented in the PACS or [employer]. Electronically Signed   By: Ulyses Southward M.D.   On: 11/11/2020 16:50    Assessment: Vaginal bleeding Retained products of conception   Plan: -NPO -LR @ 125cc/hr -SCDs in OR -IV Doxy to OR -Risk/benefit reviewed with patient including but not limited to risk of bleeding, infection and injury due to perforation. Questions and concerns were addressed and she desires to proceed.  Myna Hidalgo, DO Attending Obstetrician & Gynecologist, [facility] for [employer], [facility]

## 2020-11-19 NOTE — Op Note (Signed)
Preoperative diagnosis: Retained products of conception  Postoperative diagnosis: Same  Anesthesia: General  Procedure: Dilatation and evacuation  Surgeon: Dr. Myna Hidalgo  Estimated blood loss: 10cc UOP: 50cc  Procedure:  Pt was taken to the OR where general anesthesia was performed.  She was placed in lithotomy position.  She was prepped and draped in a sterile fashion.  The bladder was drained using a red rubber.  A speculum is inserted in the vagina and the anterior lip of the cervix was grasped with a single tooth tenaculum forcep. The cervix already appeared dilated.  The uterus sounded to ~ 6.5cm.  Using a #7 curved cannula, evacuation of products of conception was performed without difficulty under ultrasound guidance.  Again under US guidance, sharp curettage of the uterine cavity was completed to confirm complete evacuation.  Instruments are then removed. Instrument and sponge count is complete x2.   Estimated blood loss is 10cc.  The procedure was very well tolerated by the patient is taken to recovery room in a well and stable condition.  Specimen: Products of conception sent to pathology   Myna Hidalgo, DO Attending Obstetrician & Gynecologist, [facility] Center for [facility], [facility]

## 2020-11-19 NOTE — Discharge Instructions (Addendum)
HOME INSTRUCTIONS  Please note any unusual or excessive bleeding, pain, swelling. Mild dizziness or drowsiness are normal for about 24 hours after surgery.   Shower when comfortable  Restrictions: No driving for 24 hours or while taking pain medications.  Activity: Nothing in vagina (no tampons, douching, or intercourse) x 2 weeks; no tub baths for 2 weeks Vaginal spotting is expected but if your bleeding is heavy, period like,  please call the office   Diet:  You may return to your regular diet.  Do not eat large meals.  Eat small frequent meals throughout the day.  Continue to drink a good amount of water at least 6-8 glasses of water per day, hydration is very important for the healing process.  Pain Management: Take Motrin and/or Tylenol as needed for pain.  Always take prescription pain medication with food, it may cause constipation, increase fluids and fiber and you may want to take an over-the-counter stool softener like Colace as needed up to 2x a day.    Alcohol -- Avoid for 24 hours and while taking pain medications.  Nausea: Take sips of ginger ale or soda  Fever -- Call physician if temperature over 101 degrees  Follow up:  If you do not already have a follow up appointment scheduled, please call the office to schedule a follow up.  If you experience fever (a temperature greater than 100.4), pain unrelieved by pain medication, shortness of breath, swelling of a single leg, or any other symptoms which are concerning to you please the office immediately.

## 2020-11-19 NOTE — Anesthesia Preprocedure Evaluation (Addendum)
Anesthesia Evaluation  Patient identified by MRN, date of birth, ID band Patient awake    Reviewed: Allergy & Precautions, H&P , NPO status , Patient's Chart, lab work & pertinent test results  History of Anesthesia Complications Negative for: history of anesthetic complications  Airway Mallampati: II   Neck ROM: full    Dental   Pulmonary neg pulmonary ROS,    breath sounds clear to auscultation       Cardiovascular negative cardio ROS   Rhythm:regular Rate:Normal     Neuro/Psych negative neurological ROS     GI/Hepatic negative GI ROS, Neg liver ROS,   Endo/Other  negative endocrine ROS  Renal/GU negative Renal ROS  negative genitourinary   Musculoskeletal negative musculoskeletal ROS (+)   Abdominal   Peds  Hematology negative hematology ROS (+)   Anesthesia Other Findings   Reproductive/Obstetrics Retained POC                            Anesthesia Physical Anesthesia Plan  ASA: 2  Anesthesia Plan: General   Post-op Pain Management:    Induction: Intravenous  PONV Risk Score and Plan: 3 and Ondansetron, Dexamethasone, Midazolam and Treatment may vary due to age or medical condition  Airway Management Planned: LMA  Additional Equipment:   Intra-op Plan:   Post-operative Plan: Extubation in OR  Informed Consent: I have reviewed the patients History and Physical, chart, labs and discussed the procedure including the risks, benefits and alternatives for the proposed anesthesia with the patient or authorized representative who has indicated his/her understanding and acceptance.     Dental advisory given  Plan Discussed with: CRNA, Anesthesiologist and Surgeon  Anesthesia Plan Comments:        Anesthesia Quick Evaluation

## 2020-11-22 ENCOUNTER — Encounter (HOSPITAL_COMMUNITY): Payer: Self-pay | Admitting: Obstetrics & Gynecology

## 2020-11-22 LAB — SURGICAL PATHOLOGY

## 2020-11-22 NOTE — Anesthesia Postprocedure Evaluation (Signed)
Anesthesia Post Note  Patient: Shirley Greene  Procedure(s) Performed: DILATATION AND EVACUATION (Uterus) OPERATIVE ULTRASOUND (Abdomen)     Patient location during evaluation: PACU Anesthesia Type: General Level of consciousness: awake and alert Pain management: pain level controlled Vital Signs Assessment: post-procedure vital signs reviewed and stable Respiratory status: spontaneous breathing, nonlabored ventilation, respiratory function stable and patient connected to nasal cannula oxygen Cardiovascular status: blood pressure returned to baseline and stable Postop Assessment: no apparent nausea or vomiting Anesthetic complications: no   No notable events documented.  Last Vitals:  Vitals:   11/19/20 1105 11/19/20 1422  BP: 120/65   Pulse: 81   Resp: 17   Temp: (!) 36.4 C 36.7 C  SpO2: 100%     Last Pain:  Vitals:   11/19/20 1422  TempSrc:   PainSc: 0-No pain                 Denetta Fei S

## 2020-11-23 ENCOUNTER — Other Ambulatory Visit: Payer: Medicaid Other

## 2020-11-24 ENCOUNTER — Ambulatory Visit: Payer: Medicaid Other | Admitting: Family Medicine

## 2020-12-06 ENCOUNTER — Ambulatory Visit: Payer: Medicaid Other | Admitting: Obstetrics & Gynecology

## 2021-04-05 ENCOUNTER — Ambulatory Visit: Payer: Medicaid Other | Admitting: Obstetrics & Gynecology

## 2021-04-05 ENCOUNTER — Other Ambulatory Visit (HOSPITAL_COMMUNITY)
Admission: RE | Admit: 2021-04-05 | Discharge: 2021-04-05 | Disposition: A | Discharge status: Home / Self Care | Payer: Medicaid Other | Source: Ambulatory Visit | Attending: Obstetrics & Gynecology | Admitting: Obstetrics & Gynecology

## 2021-04-05 ENCOUNTER — Encounter: Payer: Self-pay | Admitting: Obstetrics & Gynecology

## 2021-04-05 ENCOUNTER — Other Ambulatory Visit: Payer: Self-pay

## 2021-04-05 VITALS — BP 116/75 | HR 103 | Ht 60.0 in | Wt 169.0 lb

## 2021-04-05 DIAGNOSIS — Z113 Encounter for screening for infections with a predominantly sexual mode of transmission: Secondary | ICD-10-CM

## 2021-04-05 DIAGNOSIS — N644 Mastodynia: Secondary | ICD-10-CM

## 2021-04-05 DIAGNOSIS — Z124 Encounter for screening for malignant neoplasm of cervix: Secondary | ICD-10-CM | POA: Insufficient documentation

## 2021-04-05 NOTE — Progress Notes (Signed)
? ?GYNECOLOGY OFFICE VISIT NOTE ? ?History:  ? Shirley Greene is a 26 y.o. [id number] here today for evaluation of left breast pain for over a week. Constant burning pain, started in inferior region of breast and now all over breast. No lumps, no drainage, no skin changes noted. No recent trauma to area.  No FH of breast or ovarian cancer but she is concerned. She denies any abnormal vaginal discharge, bleeding, pelvic pain or other concerns.  ?  ?Past Medical History:  ?Diagnosis Date  ? Alpha thalassemia silent carrier 05/27/2018  ? Anemia   ? Cervical dysplasia 02/15/2017  ? 02/2018: pap neg  ? Chlamydia   ? Infection   ? UTI  ? Sciatica 07/16/2018  ? ? ?Past Surgical History:  ?Procedure Laterality Date  ? CESAREAN SECTION N/A 04/05/2012  ? Procedure: CESAREAN SECTION;  Surgeon: Tilda Burrow, MD;  Location: [facility];  Service: Obstetrics;  Laterality: N/A;  ? CESAREAN SECTION WITH BILATERAL TUBAL LIGATION N/A 11/28/2018  ? Procedure: cesarean section;  Surgeon: Hermina Staggers, MD;  Location: [facility];  Service: Obstetrics;  Laterality: N/A;  ? DILATION AND EVACUATION N/A 11/19/2020  ? Procedure: DILATATION AND EVACUATION;  Surgeon: Myna Hidalgo, DO;  Location: [facility] OR;  Service: Gynecology;  Laterality: N/A;  ? OPERATIVE ULTRASOUND N/A 11/19/2020  ? Procedure: OPERATIVE ULTRASOUND;  Surgeon: Myna Hidalgo, DO;  Location: [facility] OR;  Service: Gynecology;  Laterality: N/A;  ? ? ?The following portions of the patient's history were reviewed and updated as appropriate: allergies, current medications, past family history, past medical history, past social history, past surgical history and problem list.  ? ?Health Maintenance:  Normal pap on 02/26/2018.  ? ?Review of Systems:  ?Pertinent items noted in HPI and remainder of comprehensive ROS otherwise negative. ? ?Physical Exam:  ?BP 116/75   Pulse (!) 103   Ht 5' (1.524 m)   Wt 169 lb (76.7 kg)   LMP 03/14/2021   BMI 33.01 kg/m?  ?CONSTITUTIONAL: Well-developed,  well-nourished female in no acute distress.  ?HEENT:  Normocephalic, atraumatic. External right and left ear normal. No scleral icterus.  ?NECK: Normal range of motion, supple, no masses noted on observation ?SKIN: No rash noted. Not diaphoretic. No erythema. No pallor. ?MUSCULOSKELETAL: Normal range of motion. No edema noted. ?NEUROLOGIC: Alert and oriented to person, place, and time. Normal muscle tone coordination. No cranial nerve deficit noted. ?PSYCHIATRIC: Normal mood and affect. Normal behavior. Normal judgment and thought content. ?CARDIOVASCULAR: Normal heart rate noted ?RESPIRATORY: Effort and breath sounds normal, no problems with respiration noted ?BREASTS: Symmetric in size. Mild tenderness on palpation of left breast diffusely. No right breast tenderness.  No masses, skin changes, nipple drainage, or lymphadenopathy bilaterally. ?ABDOMEN: No masses noted. No other overt distention noted.   ?PELVIC: Normal appearing external genitalia and urethral meatus; normal appearing vaginal mucosa and cervix.  No abnormal discharge noted.  Pap smear obtained.  Normal uterine size, no other palpable masses, no uterine or adnexal tenderness. ?    ?Assessment and Plan:  ?  1. Breast pain, left ?Likely hormonal or trauma from bra.  Recommended NSAIDs, warm compresses. ?Will get breast imaging for reassurance. ?- US BREAST LTD UNI LEFT INC AXILLA; Future ? ?2. Routine screening for STI (sexually transmitted infection) ?STI screen done today as per request. ?- RPR+HBsAg+HCVAb+HIV ?- Cytology - PAP( [place]) ? ?3. Pap smear for cervical cancer screening ?Pap done, will follow up results and manage accordingly. ?- Cytology - PAP( [facility]) ?Routine preventative health maintenance measures emphasized. ?Please refer to After Visit Summary for other counseling recommendations.  ? ?Return for any gynecologic concerns.   ? ?I spent 30 minutes dedicated to the care of this patient including pre-visit review of records,  face to face time with the patient discussing her conditions and treatments and post visit orders. ? ? ? ?Jaynie Collins, MD, FACOG ?Obstetrician Heritage manager, Faculty Practice ?Center for [employer], [facility] ? ? ? ? ? ? ?

## 2021-04-06 LAB — RPR+HBSAG+HCVAB+...
HIV Screen 4th Generation wRfx: NONREACTIVE
Hep C Virus Ab: NONREACTIVE
Hepatitis B Surface Ag: NEGATIVE
RPR Ser Ql: NONREACTIVE

## 2021-04-08 LAB — CYTOLOGY - PAP
Chlamydia: NEGATIVE
Comment: NEGATIVE
Comment: NEGATIVE
Comment: NEGATIVE
Comment: NORMAL
Diagnosis: UNDETERMINED — AB
High risk HPV: POSITIVE — AB
Neisseria Gonorrhea: NEGATIVE
Trichomonas: NEGATIVE

## 2021-04-10 ENCOUNTER — Encounter: Payer: Self-pay | Admitting: Obstetrics & Gynecology

## 2021-04-10 DIAGNOSIS — R87610 Atypical squamous cells of undetermined significance on cytologic smear of cervix (ASC-US): Secondary | ICD-10-CM | POA: Insufficient documentation

## 2021-04-11 ENCOUNTER — Ambulatory Visit
Admission: RE | Admit: 2021-04-11 | Discharge: 2021-04-11 | Disposition: A | Discharge status: Home / Self Care | Payer: Medicaid Other | Source: Ambulatory Visit | Attending: Obstetrics & Gynecology | Admitting: Obstetrics & Gynecology

## 2021-04-11 ENCOUNTER — Telehealth: Payer: Self-pay | Admitting: *Deleted

## 2021-04-11 ENCOUNTER — Other Ambulatory Visit: Payer: Self-pay

## 2021-04-11 DIAGNOSIS — N644 Mastodynia: Secondary | ICD-10-CM

## 2021-04-11 NOTE — Telephone Encounter (Signed)
Pt informed of pap results and colpo scheduled for may 9th ?

## 2021-04-11 NOTE — Telephone Encounter (Signed)
-----   Message from Tereso Newcomer, MD sent at 04/10/2021  1:36 PM EDT ----- ?Please schedule patient for colposcopy for ASCUS +HRHPV pap done on 04/05/2021. Please call to inform patient of results and need for appointment. ? ?

## 2021-04-12 ENCOUNTER — Other Ambulatory Visit: Payer: Self-pay | Admitting: Obstetrics & Gynecology

## 2021-05-11 ENCOUNTER — Telehealth: Payer: Self-pay

## 2021-05-11 NOTE — Telephone Encounter (Signed)
TC to pt to make aware Colpo can be done now w/o ECC or pp per provider pt wants to wait Post partum .  ?Message sent to scheduling to cancel pt appt.  ? ?

## 2021-05-17 ENCOUNTER — Encounter (HOSPITAL_COMMUNITY): Payer: Self-pay | Admitting: Obstetrics and Gynecology

## 2021-05-17 ENCOUNTER — Inpatient Hospital Stay (HOSPITAL_COMMUNITY): Payer: Medicaid Other

## 2021-05-17 ENCOUNTER — Inpatient Hospital Stay (HOSPITAL_COMMUNITY)
Admission: AD | Admit: 2021-05-17 | Discharge: 2021-05-17 | Disposition: A | Discharge status: Home / Self Care | Payer: Medicaid Other | Attending: Obstetrics & Gynecology | Admitting: Obstetrics & Gynecology

## 2021-05-17 DIAGNOSIS — O208 Other hemorrhage in early pregnancy: Secondary | ICD-10-CM | POA: Diagnosis not present

## 2021-05-17 DIAGNOSIS — Z3A01 Less than 8 weeks gestation of pregnancy: Secondary | ICD-10-CM | POA: Insufficient documentation

## 2021-05-17 DIAGNOSIS — R102 Pelvic and perineal pain: Secondary | ICD-10-CM | POA: Diagnosis not present

## 2021-05-17 DIAGNOSIS — O26891 Other specified pregnancy related conditions, first trimester: Secondary | ICD-10-CM | POA: Insufficient documentation

## 2021-05-17 DIAGNOSIS — O418X10 Other specified disorders of amniotic fluid and membranes, first trimester, not applicable or unspecified: Secondary | ICD-10-CM | POA: Diagnosis not present

## 2021-05-17 DIAGNOSIS — Z3490 Encounter for supervision of normal pregnancy, unspecified, unspecified trimester: Secondary | ICD-10-CM

## 2021-05-17 DIAGNOSIS — Z6790 Unspecified blood type, Rh positive: Secondary | ICD-10-CM | POA: Insufficient documentation

## 2021-05-17 DIAGNOSIS — N939 Abnormal uterine and vaginal bleeding, unspecified: Secondary | ICD-10-CM | POA: Diagnosis not present

## 2021-05-17 LAB — CBC WITH DIFFERENTIAL/PLATELET
Abs Immature Granulocytes: 0.02 10*3/uL (ref 0.00–0.07)
Basophils Absolute: 0 10*3/uL (ref 0.0–0.1)
Basophils Relative: 0 %
Eosinophils Absolute: 0.1 10*3/uL (ref 0.0–0.5)
Eosinophils Relative: 1 %
HCT: 38.1 % (ref 36.0–46.0)
Hemoglobin: 12 g/dL (ref 12.0–15.0)
Immature Granulocytes: 0 %
Lymphocytes Relative: 26 %
Lymphs Abs: 2.3 10*3/uL (ref 0.7–4.0)
MCH: 22.8 pg — ABNORMAL LOW (ref 26.0–34.0)
MCHC: 31.5 g/dL (ref 30.0–36.0)
MCV: 72.3 fL — ABNORMAL LOW (ref 80.0–100.0)
Monocytes Absolute: 0.4 10*3/uL (ref 0.1–1.0)
Monocytes Relative: 5 %
Neutro Abs: 6.2 10*3/uL (ref 1.7–7.7)
Neutrophils Relative %: 68 %
Platelets: 356 10*3/uL (ref 150–400)
RBC: 5.27 MIL/uL — ABNORMAL HIGH (ref 3.87–5.11)
RDW: 17.8 % — ABNORMAL HIGH (ref 11.5–15.5)
WBC: 9.1 10*3/uL (ref 4.0–10.5)
nRBC: 0 % (ref 0.0–0.2)

## 2021-05-17 LAB — WET PREP, GENITAL
Clue Cells Wet Prep HPF POC: NONE SEEN
Sperm: NONE SEEN
Trich, Wet Prep: NONE SEEN
WBC, Wet Prep HPF POC: >=10 — AB (ref <10)
Yeast Wet Prep HPF POC: NONE SEEN

## 2021-05-17 LAB — COMPREHENSIVE METABOLIC PANEL
ALT: 14 U/L (ref 0–44)
AST: 19 U/L (ref 15–41)
Albumin: 3.6 g/dL (ref 3.5–5.0)
Alkaline Phosphatase: 48 U/L (ref 38–126)
Anion gap: 10 (ref 5–15)
BUN: 11 mg/dL (ref 6–20)
CO2: 19 mmol/L — ABNORMAL LOW (ref 22–32)
Calcium: 8.8 mg/dL — ABNORMAL LOW (ref 8.9–10.3)
Chloride: 105 mmol/L (ref 98–111)
Creatinine, Ser: 0.71 mg/dL (ref 0.44–1.00)
GFR, Estimated: >60 mL/min (ref >60)
Glucose, Bld: 105 mg/dL — ABNORMAL HIGH (ref 70–99)
Potassium: 3.7 mmol/L (ref 3.5–5.1)
Sodium: 134 mmol/L — ABNORMAL LOW (ref 135–145)
Total Bilirubin: 0.4 mg/dL (ref 0.3–1.2)
Total Protein: 6.9 g/dL (ref 6.5–8.1)

## 2021-05-17 LAB — URINALYSIS, ROUTINE W REFLEX MICROSCOPIC
Bilirubin Urine: NEGATIVE
Glucose, UA: NEGATIVE mg/dL
Hgb urine dipstick: NEGATIVE
Ketones, ur: 5 mg/dL — AB
Leukocytes,Ua: NEGATIVE
Nitrite: NEGATIVE
Protein, ur: NEGATIVE mg/dL
Specific Gravity, Urine: 1.021 (ref 1.005–1.030)
pH: 5 (ref 5.0–8.0)

## 2021-05-17 LAB — HCG, QUANTITATIVE, PREGNANCY: hCG, Beta Chain, Quant, S: 13076 m[IU]/mL — ABNORMAL HIGH (ref <5)

## 2021-05-17 LAB — POCT PREGNANCY, URINE: Preg Test, Ur: POSITIVE — AB

## 2021-05-17 NOTE — MAU Note (Addendum)
...  Shirley Greene is a 26 y.o. at Unknown here in MAU reporting: Vaginal spotting for 4 days now as well as intermittent lower pelvic cramping and right lower abdominal cramping for one week. She is also reporting vaginal itching for 7 days now and reports she completed Monistat 7 and her symptoms have not improved. Denies vaginal odors.  ? ?First OB appointment 22nd of May with [facility]. ? ?LMP: Unsure. Sometime in March. Did not have a period in April. ? ?Pain score:  ?7/10 right lower abdomen ?6/10 anterior pelvic pain ? ?Lab orders placed from triage:  POCT Pregnancy, UA ? ?

## 2021-05-17 NOTE — Discharge Instructions (Signed)

## 2021-05-17 NOTE — MAU Provider Note (Signed)
?History  ?  ? ?CSN: [id number] ? ?Arrival date and time: 05/17/21 0745 ? ? Event Date/Time  ? First Provider Initiated Contact with Patient 05/17/21 [phone]   ?  ? ?Chief Complaint  ?Patient presents with  ? Abdominal Pain  ? Pelvic Pain  ? Vaginal Bleeding  ? ?Shirley Greene is a 26 y.o. [id number] at [redacted]w[redacted]d who presents to MAU for vaginal bleeding which began about one week ago. Patient reports the bleeding is spotting and only present when wiping and she does not need to wear a pad. Patient also endorses occasional cramping. ? ?Pt denies vaginal discharge/odor/itching. ?Pt denies N/V, abdominal pain, constipation, diarrhea, or urinary problems. ?Pt denies fever, chills, fatigue, sweating or changes in appetite. ?Pt denies SOB or chest pain. ?Pt denies dizziness, HA, light-headedness, weakness. ? ? ?OB History   ? ? Gravida  ?4  ? Para  ?2  ? Term  ?2  ? Preterm  ?   ? AB  ?1  ? Living  ?2  ?  ? ? SAB  ?   ? IAB  ?1  ? Ectopic  ?   ? Multiple  ?0  ? Live Births  ?2  ?   ?  ? Obstetric Comments  ?"OP" presentation, got stuck at 9cm  ?  ? ?  ? ? ?Past Medical History:  ?Diagnosis Date  ? Alpha thalassemia silent carrier 05/27/2018  ? Anemia   ? Cervical dysplasia 02/15/2017  ? 02/2018: pap neg  ? Chlamydia   ? Infection   ? UTI  ? Sciatica 07/16/2018  ? ? ?Past Surgical History:  ?Procedure Laterality Date  ? CESAREAN SECTION N/A 04/05/2012  ? Procedure: CESAREAN SECTION;  Surgeon: Tilda Burrow, MD;  Location: [facility];  Service: Obstetrics;  Laterality: N/A;  ? CESAREAN SECTION WITH BILATERAL TUBAL LIGATION N/A 11/28/2018  ? Procedure: cesarean section;  Surgeon: Hermina Staggers, MD;  Location: [facility];  Service: Obstetrics;  Laterality: N/A;  ? DILATION AND EVACUATION N/A 11/19/2020  ? Procedure: DILATATION AND EVACUATION;  Surgeon: Myna Hidalgo, DO;  Location: [facility];  Service: Gynecology;  Laterality: N/A;  ? OPERATIVE ULTRASOUND N/A 11/19/2020  ? Procedure: OPERATIVE ULTRASOUND;  Surgeon: Myna Hidalgo, DO;   Location: [facility];  Service: Gynecology;  Laterality: N/A;  ? ? ?Family History  ?Problem Relation Age of Onset  ? Hypertension Mother   ? Hypertension Father   ? Cancer Maternal Grandfather   ? ? ?Social History  ? ?Tobacco Use  ? Smoking status: Never  ? Smokeless tobacco: Never  ?Vaping Use  ? Vaping Use: Never used  ?Substance Use Topics  ? Alcohol use: Not Currently  ?  Comment: twice a month  ? Drug use: No  ? ? ?Allergies: No Known Allergies ? ?Medications Prior to Admission  ?Medication Sig Dispense Refill Last Dose  ? acetaminophen (TYLENOL) 500 MG tablet Take 1,000 mg by mouth every 6 (six) hours as needed for mild pain or moderate pain.     ? ? ?Review of Systems  ?Constitutional:  Negative for chills, diaphoresis, fatigue and fever.  ?Eyes:  Negative for visual disturbance.  ?Respiratory:  Negative for shortness of breath.   ?Cardiovascular:  Negative for chest pain.  ?Gastrointestinal:  Negative for abdominal pain, constipation, diarrhea, nausea and vomiting.  ?Genitourinary:  Positive for vaginal bleeding. Negative for dysuria, flank pain, frequency, pelvic pain, urgency and vaginal discharge.  ?Neurological:  Negative for dizziness, weakness, light-headedness and headaches.  ? ?  Physical Exam  ? ?Blood pressure 117/71, pulse (!) 103, temperature 98.5 ?F (36.9 ?C), temperature source Oral, resp. rate 17, height 5' (1.524 m), weight 77.7 kg, SpO2 100 %, unknown if currently breastfeeding. ? ?Patient Vitals for the past 24 hrs: ? BP Temp Temp src Pulse Resp SpO2 Height Weight  ?05/17/21 0800 117/71 98.5 ?F (36.9 ?C) Oral (!) 103 17 100 % 5' (1.524 m) 77.7 kg  ? ?Physical Exam ?Vitals and nursing note reviewed.  ?Constitutional:   ?   General: She is not in acute distress. ?   Appearance: Normal appearance. She is not ill-appearing, toxic-appearing or diaphoretic.  ?HENT:  ?   Head: Normocephalic and atraumatic.  ?Pulmonary:  ?   Effort: Pulmonary effort is normal.  ?Neurological:  ?   Mental Status: She is  alert and oriented to person, place, and time.  ?Psychiatric:     ?   Mood and Affect: Mood normal.     ?   Behavior: Behavior normal.     ?   Thought Content: Thought content normal.     ?   Judgment: Judgment normal.  ? ?Results for orders placed or performed during the hospital encounter of 05/17/21 (from the past 24 hour(s))  ?Urinalysis, Routine w reflex microscopic     Status: Abnormal  ? Collection Time: 05/17/21  7:55 AM  ?Result Value Ref Range  ? Color, Urine YELLOW YELLOW  ? APPearance HAZY (A) CLEAR  ? Specific Gravity, Urine 1.021 1.005 - 1.030  ? pH 5.0 5.0 - 8.0  ? Glucose, UA NEGATIVE NEGATIVE mg/dL  ? Hgb urine dipstick NEGATIVE NEGATIVE  ? Bilirubin Urine NEGATIVE NEGATIVE  ? Ketones, ur 5 (A) NEGATIVE mg/dL  ? Protein, ur NEGATIVE NEGATIVE mg/dL  ? Nitrite NEGATIVE NEGATIVE  ? Leukocytes,Ua NEGATIVE NEGATIVE  ?Pregnancy, urine POC     Status: Abnormal  ? Collection Time: 05/17/21  7:55 AM  ?Result Value Ref Range  ? Preg Test, Ur POSITIVE (A) NEGATIVE  ?CBC with Differential/Platelet     Status: Abnormal  ? Collection Time: 05/17/21  8:32 AM  ?Result Value Ref Range  ? WBC 9.1 4.0 - 10.5 K/uL  ? RBC 5.27 (H) 3.87 - 5.11 MIL/uL  ? Hemoglobin 12.0 12.0 - 15.0 g/dL  ? HCT 38.1 36.0 - 46.0 %  ? MCV 72.3 (L) 80.0 - 100.0 fL  ? MCH 22.8 (L) 26.0 - 34.0 pg  ? MCHC 31.5 30.0 - 36.0 g/dL  ? RDW 17.8 (H) 11.5 - 15.5 %  ? Platelets 356 150 - 400 K/uL  ? nRBC 0.0 0.0 - 0.2 %  ? Neutrophils Relative % 68 %  ? Neutro Abs 6.2 1.7 - 7.7 K/uL  ? Lymphocytes Relative 26 %  ? Lymphs Abs 2.3 0.7 - 4.0 K/uL  ? Monocytes Relative 5 %  ? Monocytes Absolute 0.4 0.1 - 1.0 K/uL  ? Eosinophils Relative 1 %  ? Eosinophils Absolute 0.1 0.0 - 0.5 K/uL  ? Basophils Relative 0 %  ? Basophils Absolute 0.0 0.0 - 0.1 K/uL  ? Immature Granulocytes 0 %  ? Abs Immature Granulocytes 0.02 0.00 - 0.07 K/uL  ?Comprehensive metabolic panel     Status: Abnormal  ? Collection Time: 05/17/21  8:32 AM  ?Result Value Ref Range  ? Sodium 134 (L)  135 - 145 mmol/L  ? Potassium 3.7 3.5 - 5.1 mmol/L  ? Chloride 105 98 - 111 mmol/L  ? CO2 19 (L) 22 - 32 mmol/L  ?  Glucose, Bld 105 (H) 70 - 99 mg/dL  ? BUN 11 6 - 20 mg/dL  ? Creatinine, Ser 0.71 0.44 - 1.00 mg/dL  ? Calcium 8.8 (L) 8.9 - 10.3 mg/dL  ? Total Protein 6.9 6.5 - 8.1 g/dL  ? Albumin 3.6 3.5 - 5.0 g/dL  ? AST 19 15 - 41 U/L  ? ALT 14 0 - 44 U/L  ? Alkaline Phosphatase 48 38 - 126 U/L  ? Total Bilirubin 0.4 0.3 - 1.2 mg/dL  ? GFR, Estimated >60 >60 mL/min  ? Anion gap 10 5 - 15  ?hCG, quantitative, pregnancy     Status: Abnormal  ? Collection Time: 05/17/21  8:32 AM  ?Result Value Ref Range  ? hCG, Beta Chain, Quant, S 13,076 (H) <5 mIU/mL  ?Wet prep, genital     Status: Abnormal  ? Collection Time: 05/17/21  8:38 AM  ?Result Value Ref Range  ? Yeast Wet Prep HPF POC NONE SEEN NONE SEEN  ? Trich, Wet Prep NONE SEEN NONE SEEN  ? Clue Cells Wet Prep HPF POC NONE SEEN NONE SEEN  ? WBC, Wet Prep HPF POC >=10 (A) <10  ? Sperm NONE SEEN   ? ? ?US OB LESS THAN 14 WEEKS WITH OB TRANSVAGINAL ? ?Result Date: 05/17/2021 ?CLINICAL DATA:  Right lower quadrant pain and vaginal bleeding in 1st trimester pregnancy. Unknown LMP. EXAM: OBSTETRIC <14 WK US AND TRANSVAGINAL OB US TECHNIQUE: Both transabdominal and transvaginal ultrasound examinations were performed for complete evaluation of the gestation as well as the maternal uterus, adnexal regions, and pelvic cul-de-sac. Transvaginal technique was performed to assess early pregnancy. COMPARISON:  None. FINDINGS: Intrauterine gestational sac: Single, located in lower uterine segment Yolk sac:  Visualized. Embryo:  Not Visualized. MSD: 8 mm   5 w   4 d Subchorionic hemorrhage:  Small subchorionic hemorrhage. Maternal uterus/adnexae: Both ovaries are normal in appearance. No mass or abnormal free fluid identified. IMPRESSION: Single intrauterine gestational sac measuring 5 weeks 4 days by mean sac diameter. Sac location in lower uterine segment and small subchorionic  hemorrhage are poor prognostic signs. Suggest correlation with serial b-hCG levels, and followup ultrasound to assess viability in 10 days. Electronically Signed   By: Danae OrleansJohn A Stahl M.D.   On: 05/17/2021 09:25

## 2021-05-18 LAB — GC/CHLAMYDIA PROBE AMP (~~LOC~~) NOT AT ARMC
Chlamydia: NEGATIVE
Comment: NEGATIVE
Comment: NORMAL
Neisseria Gonorrhea: NEGATIVE

## 2021-05-22 ENCOUNTER — Telehealth: Payer: Self-pay | Admitting: Urgent Care

## 2021-05-22 DIAGNOSIS — O219 Vomiting of pregnancy, unspecified: Secondary | ICD-10-CM

## 2021-05-22 NOTE — Progress Notes (Signed)
For the safety of you and your child, I recommend a face to face office visit with a health care provider.  Many mothers need to take medicines during their pregnancy and while nursing.  Almost all medicines pass into the breast milk in small quantities.  Most are generally considered safe for a mother to take but some medicines must be avoided.  After reviewing your E-Visit request, I recommend that you consult your OB/GYN or pediatrician for medical advice in relation to your condition and prescription medications while pregnant or breastfeeding.  NOTE:  There will be NO CHARGE for this eVisit  If you are having a true medical emergency please call 911.    For an urgent face to face visit, [place] has six urgent care centers for your convenience:     [facility] [place] Get Driving Directions [phone] [address] [place], [place] [address]    [facility] ([place] Get Driving Directions [phone] [address] [place], [place] [address]  [facility] ([place] [facility] [place]) Get Driving Directions [phone] [address] ,  [place]  [address]  [facility] Get Driving Directions [phone] [address], [address] [place], [place] [address]   [facility] [place] Get Driving Directions  [phone] [address] [place], [place] [address]    [facility] Get Driving Directions [phone] [address] [place],  [address]  Your MyChart E-visit questionnaire answers were reviewed by a board certified advanced clinical practitioner to complete your personal care plan based on your specific symptoms.  Thank you for using e-Visits. 

## 2021-05-23 ENCOUNTER — Encounter: Payer: Self-pay | Admitting: Obstetrics & Gynecology

## 2021-05-23 DIAGNOSIS — R112 Nausea with vomiting, unspecified: Secondary | ICD-10-CM

## 2021-05-23 MED ORDER — PROMETHAZINE HCL 25 MG PO TABS: 25.0000 mg | ORAL_TABLET | Freq: Four times a day (QID) | ORAL | 1 refills | Status: DC | PRN

## 2021-05-24 ENCOUNTER — Encounter: Payer: Medicaid Other | Admitting: Obstetrics & Gynecology

## 2021-05-26 ENCOUNTER — Ambulatory Visit (INDEPENDENT_AMBULATORY_CARE_PROVIDER_SITE_OTHER): Payer: Medicaid Other | Admitting: *Deleted

## 2021-05-26 ENCOUNTER — Ambulatory Visit (INDEPENDENT_AMBULATORY_CARE_PROVIDER_SITE_OTHER): Payer: Medicaid Other

## 2021-05-26 VITALS — BP 117/79 | HR 106

## 2021-05-26 DIAGNOSIS — Z3401 Encounter for supervision of normal first pregnancy, first trimester: Secondary | ICD-10-CM | POA: Diagnosis not present

## 2021-05-26 DIAGNOSIS — Z3A01 Less than 8 weeks gestation of pregnancy: Secondary | ICD-10-CM

## 2021-05-26 DIAGNOSIS — O0990 Supervision of high risk pregnancy, unspecified, unspecified trimester: Secondary | ICD-10-CM | POA: Insufficient documentation

## 2021-05-26 DIAGNOSIS — Z3490 Encounter for supervision of normal pregnancy, unspecified, unspecified trimester: Secondary | ICD-10-CM

## 2021-05-26 DIAGNOSIS — Z3480 Encounter for supervision of other normal pregnancy, unspecified trimester: Secondary | ICD-10-CM

## 2021-05-26 DIAGNOSIS — Z3481 Encounter for supervision of other normal pregnancy, first trimester: Secondary | ICD-10-CM

## 2021-05-26 MED ORDER — DICLEGIS 10-10 MG PO TBEC: 2.0000 | DELAYED_RELEASE_TABLET | Freq: Every day | ORAL | 5 refills | Status: DC

## 2021-05-26 NOTE — Progress Notes (Signed)
New OB Intake ? ?I explained I am completing New OB Intake today and repeat scan to confirm viability. We discussed her EDD of 01/16/22 that is based on today's scan. Pt is G4/P2. I reviewed her allergies, medications, Medical/Surgical/OB history, and appropriate screenings.   ? ?Patient Active Problem List  ? Diagnosis Date Noted  ? 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 ? ?Concerns addressed today ? ?Delivery Plans:  ?Plans to deliver at [facility].  ? ? ?Anatomy US ?Explained first scheduled [place] will be around 19 weeks.  ? ?Labs ?Discussed Avelina Laine genetic screening with patient. Would like Panorama drawn at new OB visit. Routine prenatal labs needed. ? ? ?Placed OB Box on problem list and updated ? ? ?Patient informed that the ultrasound is considered a limited obstetric ultrasound and is not intended to be a complete ultrasound exam.  Patient also informed that the ultrasound is not being completed with the intent of assessing for fetal or placental anomalies or any pelvic abnormalities. Explained that the purpose of today's ultrasound is to assess for dating and fetal heart rate.  Patient acknowledges the purpose of the exam and the limitations of the study.    ? ? First visit review ?I reviewed new OB appt with pt. I explained she will have ob bloodwork with genetic screening. Explained pt will be seen by DR Anyanwu at first visit; encounter routed to appropriate provider.  ? ?Scheryl Marten, RN ?05/26/2021  9:52 AM  ?

## 2021-06-06 ENCOUNTER — Other Ambulatory Visit: Payer: Medicaid Other

## 2021-06-16 ENCOUNTER — Encounter: Payer: Self-pay | Admitting: Advanced Practice Midwife

## 2021-06-16 ENCOUNTER — Ambulatory Visit (INDEPENDENT_AMBULATORY_CARE_PROVIDER_SITE_OTHER): Payer: Medicaid Other | Admitting: Advanced Practice Midwife

## 2021-06-16 ENCOUNTER — Other Ambulatory Visit (HOSPITAL_COMMUNITY)
Admission: RE | Admit: 2021-06-16 | Discharge: 2021-06-16 | Disposition: A | Discharge status: Home / Self Care | Payer: Medicaid Other | Source: Ambulatory Visit | Attending: Obstetrics & Gynecology | Admitting: Obstetrics & Gynecology

## 2021-06-16 VITALS — BP 114/71 | HR 102 | Temp 97.8°F | Wt 170.0 lb

## 2021-06-16 DIAGNOSIS — M549 Dorsalgia, unspecified: Secondary | ICD-10-CM | POA: Diagnosis not present

## 2021-06-16 DIAGNOSIS — Z98891 History of uterine scar from previous surgery: Secondary | ICD-10-CM

## 2021-06-16 DIAGNOSIS — O219 Vomiting of pregnancy, unspecified: Secondary | ICD-10-CM | POA: Diagnosis not present

## 2021-06-16 DIAGNOSIS — Z3490 Encounter for supervision of normal pregnancy, unspecified, unspecified trimester: Secondary | ICD-10-CM | POA: Diagnosis not present

## 2021-06-16 DIAGNOSIS — Z3A09 9 weeks gestation of pregnancy: Secondary | ICD-10-CM

## 2021-06-16 DIAGNOSIS — Z3480 Encounter for supervision of other normal pregnancy, unspecified trimester: Secondary | ICD-10-CM

## 2021-06-16 LAB — CBC WITH DIFFERENTIAL/PLATELET
Basophils Absolute: 0 10*3/uL (ref 0.0–0.2)
Basos: 0 %
EOS (ABSOLUTE): 0.1 10*3/uL (ref 0.0–0.4)
Eos: 1 %
Hematocrit: 38.6 % (ref 34.0–46.6)
Hemoglobin: 12.3 g/dL (ref 11.1–15.9)
Immature Grans (Abs): 0 10*3/uL (ref 0.0–0.1)
Immature Granulocytes: 0 %
Lymphocytes Absolute: 2.4 10*3/uL (ref 0.7–3.1)
Lymphs: 27 %
MCH: 23.3 pg — ABNORMAL LOW (ref 26.6–33.0)
MCHC: 31.9 g/dL (ref 31.5–35.7)
MCV: 73 fL — ABNORMAL LOW (ref 79–97)
Monocytes Absolute: 0.4 10*3/uL (ref 0.1–0.9)
Monocytes: 5 %
Neutrophils Absolute: 5.9 10*3/uL (ref 1.4–7.0)
Neutrophils: 67 %
Platelets: 347 10*3/uL (ref 150–450)
RBC: 5.29 x10E6/uL — ABNORMAL HIGH (ref 3.77–5.28)
RDW: 17 % — ABNORMAL HIGH (ref 11.7–15.4)
WBC: 8.9 10*3/uL (ref 3.4–10.8)

## 2021-06-16 LAB — POCT URINALYSIS DIPSTICK
Bilirubin, UA: 0.2
Blood, UA: NEGATIVE
Glucose, UA: NEGATIVE
Ketones, UA: NEGATIVE
Leukocytes, UA: NEGATIVE
Nitrite, UA: NEGATIVE
Protein, UA: NEGATIVE
Spec Grav, UA: >=1.03 — AB (ref 1.010–1.025)
Urobilinogen, UA: 0.2 E.U./dL
pH, UA: 6 (ref 5.0–8.0)

## 2021-06-16 MED ORDER — METOCLOPRAMIDE HCL 10 MG PO TABS: 10.0000 mg | ORAL_TABLET | Freq: Four times a day (QID) | ORAL | 2 refills | Status: DC

## 2021-06-16 NOTE — Patient Instructions (Signed)
Vomiting in First Trimester Follow these instructions at home: To help relieve your symptoms, listen to your body. Everyone is different and has different preferences. Find what works best for you. Here are some things you can try to help relieve your symptoms: Meals and snacks Eat 5-6 small meals daily instead of 3 large meals. Eating small meals and snacks can help you avoid an empty stomach. Before getting out of bed, eat a couple of crackers to avoid moving around on an empty stomach. Eat a protein-rich snack before bed. Examples include cheese and crackers, or a peanut butter sandwich made with 1 slice of whole-wheat bread and 1 tsp (5 g) of peanut butter. Eat and drink slowly. Try eating starchy foods as these are usually tolerated well. Examples include cereal, toast, bread, potatoes, pasta, rice, and pretzels. Eat at least one serving of protein with your meals and snacks. Protein options include lean meats, poultry, seafood, beans, nuts, nut butters, eggs, cheese, and yogurt. Eat or suck on things that have ginger in them. It may help to relieve nausea. Add  tsp (0.44 g) ground ginger to hot tea, or choose ginger tea.   Fluids It is important to stay hydrated. Try to: Drink small amounts of fluids often. Drink fluids 30 minutes before or after a meal to help lessen the feeling of a full stomach. Drink 100% fruit juice or an electrolyte drink. An electrolyte drink contains sodium, potassium, and chloride. Drink fluids that are cold, clear, and carbonated or sour. These include lemonade, ginger ale, lemon-lime soda, ice water, and sparkling water. Things to avoid Avoid the following: Eating foods that trigger your symptoms. These may include spicy foods, coffee, high-fat foods, very sweet foods, and acidic foods. Drinking more than 1 cup of fluid at a time. Skipping meals. Nausea can be more intense on an empty stomach. If you cannot tolerate food, do not force it. Try sucking on ice  chips or other frozen items and make up for missed calories later. Lying down within 2 hours after eating. Being exposed to environmental triggers. These may include food smells, smoky rooms, closed spaces, rooms with strong smells, warm or humid places, overly loud and noisy rooms, and rooms with motion or flickering lights. Try eating meals in a well-ventilated area that is free of strong smells. Making quick and sudden changes in your movement. Taking iron pills and multivitamins that contain iron. If you take prescription iron pills, do not stop taking them unless your health care provider approves. Preparing food. The smell of food can spoil your appetite or trigger nausea. General instructions Brush your teeth or use a mouth rinse after meals. Take over-the-counter and prescription medicines only as told by your health care provider. Follow instructions from your health care provider about eating or drinking restrictions. Talk with your health care provider about starting a supplement of vitamin B6. Continue to take your prenatal vitamins as told by your health care provider. If you are having trouble taking your prenatal vitamins, talk with your health care provider about other options. Keep all follow-up visits. This is important. Follow-up visits include prenatal visits. Contact a health care provider if: You have pain in your abdomen. You have a severe headache. You have vision problems. You are losing weight. You feel weak or dizzy. You cannot eat or drink without vomiting, especially if this goes on for a full day. Get help right away if: You cannot drink fluids without vomiting. You vomit blood. You have constant   nausea and vomiting. You are very weak. You faint. You have a fever and your symptoms suddenly get worse. Summary Making some changes to your eating habits may help relieve nausea and vomiting. This condition may be managed with lifestyle changes and medicines as  prescribed by your health care provider. If medicines do not help relieve nausea and vomiting, you may need to receive fluids through an IV at the hospital. This information is not intended to replace advice given to you by your health care provider. Make sure you discuss any questions you have with your health care provider. Document Revised: 07/28/2019 Document Reviewed: 07/28/2019 Elsevier Patient Education  2021 [employer].  Safe Medications in Pregnancy   Acne: Benzoyl Peroxide Salicylic Acid  Backache/Headache: Tylenol: 2 regular strength every 4 hours OR              2 Extra strength every 6 hours  Colds/Coughs/Allergies: Benadryl (alcohol free) 25 mg every 6 hours as needed Breath right strips Claritin Cepacol throat lozenges Chloraseptic throat spray Cold-Eeze- up to three times per day Cough drops, alcohol free Flonase (by prescription only) Guaifenesin Mucinex Robitussin DM (plain only, alcohol free) Saline nasal spray/drops Sudafed (pseudoephedrine) & Actifed ** use only after [redacted] weeks gestation and if you do not have high blood pressure Tylenol Vicks Vaporub Zinc lozenges Zyrtec   Constipation: Colace Ducolax suppositories Fleet enema Glycerin suppositories Metamucil Milk of magnesia Miralax Senokot Smooth move tea  Diarrhea: Kaopectate Imodium A-D  *NO pepto Bismol  Hemorrhoids: Anusol Anusol HC Preparation H Tucks  Indigestion: Tums Maalox Mylanta Zantac  Pepcid  Insomnia: Benadryl (alcohol free) 25mg every 6 hours as needed Tylenol PM Unisom, no Gelcaps  Leg Cramps: Tums MagGel  Nausea/Vomiting:  Bonine Dramamine Emetrol Ginger extract Sea bands Meclizine  Nausea medication to take during pregnancy:  Unisom (doxylamine succinate 25 mg tablets) Take one tablet daily at bedtime. If symptoms are not adequately controlled, the dose can be increased to a maximum recommended dose of two tablets daily (1/2 tablet in the  morning, 1/2 tablet mid-afternoon and one at bedtime). Vitamin B6 100mg tablets. Take one tablet twice a day (up to 200 mg per day).  Skin Rashes: Aveeno products Benadryl cream or 25mg every 6 hours as needed Calamine Lotion 1% cortisone cream  Yeast infection: Gyne-lotrimin 7 Monistat 7   **If taking multiple medications, please check labels to avoid duplicating the same active ingredients **take medication as directed on the label ** Do not exceed 4000 mg of tylenol in 24 hours **Do not take medications that contain aspirin or ibuprofen     

## 2021-06-16 NOTE — Progress Notes (Unsigned)
New OB [redacted]w[redacted]d  Last pap: 04/05/21 ASCUS + HPV   CC: Possible UTI. Back pain, chills, fatigue.  Pt states symptoms have been about 1 week now. Pt states she may be dehydrated. Has not been able to eat/drink taking Rx for nausea no relief.   *Pt has concerns w/dates of LMP on recent U/S.

## 2021-06-17 LAB — BASIC METABOLIC PANEL
BUN/Creatinine Ratio: 6 — ABNORMAL LOW (ref 9–23)
BUN: 4 mg/dL — ABNORMAL LOW (ref 6–20)
CO2: 22 mmol/L (ref 20–29)
Calcium: 9.7 mg/dL (ref 8.7–10.2)
Chloride: 100 mmol/L (ref 96–106)
Creatinine, Ser: 0.69 mg/dL (ref 0.57–1.00)
Glucose: 79 mg/dL (ref 70–99)
Potassium: 3.7 mmol/L (ref 3.5–5.2)
Sodium: 136 mmol/L (ref 134–144)
eGFR: 122 mL/min/{1.73_m2} (ref >59)

## 2021-06-17 NOTE — Progress Notes (Signed)
History:   Shirley FriesDeonna O Greene is a 26 y.o. [id number] at 7462w4d by early ultrasound being seen today for her first obstetrical visit.  Her obstetrical history is significant for obesity and cesarean section x 2 . Patient does intend to breast feed. Pregnancy history fully reviewed.  Patient reports  recurrent vomiting . She has been managing her vomiting with Diclegis which helps but her stomach still feels "unsettled" most of the time. As of her appointment, she has only consumed one biscuit today. She describes her nausea as "heavy" and finds that it worsens if she tries to avoid food.   Patient also c/o back pain and urinary frequency. Her mom thought it might be a UTI. She denies dysuria, hematuria, flank pain, fever.  Patient is considering BTL for postpartum contraception.     HISTORY: OB History  Gravida Para Term Preterm AB Living  4 2 2  0 1 2  SAB IAB Ectopic Multiple Live Births  0 1 0 0 2    # Outcome Date GA Lbr Len/2nd Weight Sex Delivery Anes PTL Lv  4 Current           3 IAB 11/2020          2 Term 11/28/18 7657w3d  8 lb 6.4 oz (3.81 kg) M CS-LTranv Spinal  LIV     Name: Mattera,BOY Jailene     Apgar1: 8  Apgar5: 9  1 Term 04/05/12 9026w1d  7 lb 14.8 oz (3.595 kg) M CS-LTranv EPI  LIV     Name: Cleopatra CedarRAGLAND,BOY Cheyene     Apgar1: 9  Apgar5: 9    Obstetric Comments  "OP" presentation, got stuck at 9cm    Last pap smear was done 04/05/2021 and was abnormal - ASCUS+ HRHPV  Past Medical History:  Diagnosis Date   Alpha thalassemia silent carrier 05/27/2018   Anemia    Cervical dysplasia 02/15/2017   02/2018: pap neg   Chlamydia    Infection    UTI   Sciatica 07/16/2018   Past Surgical History:  Procedure Laterality Date   CESAREAN SECTION N/A 04/05/2012   Procedure: CESAREAN SECTION;  Surgeon: Tilda BurrowJohn V Ferguson, MD;  Location: [facility];  Service: Obstetrics;  Laterality: N/A;   CESAREAN SECTION WITH BILATERAL TUBAL LIGATION N/A 11/28/2018   Procedure: cesarean section;   Surgeon: Hermina StaggersErvin, Michael L, MD;  Location: [facility];  Service: Obstetrics;  Laterality: N/A;   DILATION AND EVACUATION N/A 11/19/2020   Procedure: DILATATION AND EVACUATION;  Surgeon: Myna Hidalgozan, Jennifer, DO;  Location: [facility];  Service: Gynecology;  Laterality: N/A;   OPERATIVE ULTRASOUND N/A 11/19/2020   Procedure: OPERATIVE ULTRASOUND;  Surgeon: Myna Hidalgozan, Jennifer, DO;  Location: [facility];  Service: Gynecology;  Laterality: N/A;   Family History  Problem Relation Age of Onset   Hypertension Mother    Hypertension Father    Cancer Maternal Grandfather    Social History   Tobacco Use   Smoking status: Never   Smokeless tobacco: Never  Vaping Use   Vaping Use: Never used  Substance Use Topics   Alcohol use: Not Currently    Comment: twice a month   Drug use: No   No Known Allergies Current Outpatient Medications on File Prior to Visit  Medication Sig Dispense Refill   DICLEGIS 10-10 MG TBEC Take 2 tablets by mouth at bedtime. If symptoms persist, add one tablet in the morning and one in the afternoon 100 tablet 5   acetaminophen (TYLENOL) 500 MG tablet Take  1,000 mg by mouth every 6 (six) hours as needed for mild pain or moderate pain. (Patient not taking: Reported on 06/16/2021)     promethazine (PHENERGAN) 25 MG tablet Take 1 tablet (25 mg total) by mouth every 6 (six) hours as needed for nausea or vomiting. (Patient not taking: Reported on 06/16/2021) 30 tablet 1   No current facility-administered medications on file prior to visit.    Review of Systems Pertinent items noted in HPI and remainder of comprehensive ROS otherwise negative. Physical Exam:   Vitals:   06/16/21 1525  BP: 114/71  Pulse: (!) 102  Temp: 97.8 F (36.6 C)  Weight: 170 lb (77.1 kg)     Uterus:     Pelvic Exam:  Deferred  System: General: well-developed, well-nourished female in no acute distress   Skin: normal coloration and turgor, no rashes   Neurologic: oriented, normal, negative, normal mood   Extremities:  normal strength, tone, and muscle mass, ROM of all joints is normal   HEENT PERRLA, extraocular movement intact and sclera clear, anicteric   Mouth/Teeth mucous membranes moist, pharynx normal without lesions and dental hygiene good   Neck supple and no masses   Cardiovascular: regular rate and rhythm   Respiratory:  no respiratory distress, normal breath sounds   Abdomen: soft, non-tender; bowel sounds normal; no masses,  no organomegaly  Bedside Ultrasound for FHR check: Patient informed that the ultrasound is considered a limited obstetric ultrasound and is not intended to be a complete ultrasound exam.  Patient also informed that the ultrasound is not being completed with the intent of assessing for fetal or placental anomalies or any pelvic abnormalities.  Explained that the purpose of today's ultrasound is to assess for fetal heart rate.  Patient acknowledges the purpose of the exam and the limitations of the study.     Assessment:    Pregnancy: [id number] Patient Active Problem List   Diagnosis Date Noted   Supervision of other normal pregnancy, antepartum 05/26/2021   ASCUS with positive high risk HPV cervical on 04/05/2021 04/10/2021      Indications for ASA therapy (per uptodate)  Two or more of the following: Nulliparity No Obesity (body mass index >30 kg/m2) Yes (33.2) Family history of preeclampsia in mother or sister No Age ?35 years No Sociodemographic characteristics (African American race, low socioeconomic level) Yes Personal risk factors (eg, previous pregnancy with low birth weight or small for gestational age infant, previous adverse pregnancy outcome [eg, stillbirth], interval >10 years between pregnancies) No  Plan:    1. Supervision of other normal pregnancy, antepartum - Welcome back to practice! Grateful for your trust! - Reviewed recommendation for bASA to start 12-[redacted] weeks gestation, discuss more next visit - Urine cytology ancillary only  2. Back pain,  unspecified back location, unspecified back pain laterality, unspecified chronicity - Pertinent negatives: Patient is without dysuria, fever, CVAT - POCT Urinalysis Dipstick - Culture, OB Urine  3. Vomiting affecting pregnancy - Discussed options for antiemetics. Patient elects Reglan - Reviewed changes to diet which may reduce frequency of vomiting - CBC w/Diff - Basic metabolic panel  4. Hx of cesarean section - For repeat  5. [redacted] weeks gestation of pregnancy    Initial labs drawn. Continue prenatal vitamins. Genetic Screening discussed, First trimester screen, Quad screen, and NIPS:  patient will return in two weeks . Ultrasound discussed; fetal anatomic survey: Discussed Problem list reviewed and updated. The nature of [place] [facility] with multiple  MDs and other Advanced Practice Providers was explained to patient; also emphasized that residents, students are part of our team. Routine obstetric precautions reviewed. Return for Virtual with Sam in two weeks to talk about success of new regimen for managing emesis.     Clayton Bibles, MSN, CNM Certified Nurse Midwife, [employer] for [employer], [facility] 06/17/21 3:08 PM

## 2021-06-18 LAB — URINE CULTURE, OB REFLEX

## 2021-06-18 LAB — CULTURE, OB URINE

## 2021-06-20 LAB — URINE CYTOLOGY ANCILLARY ONLY
Chlamydia: NEGATIVE
Comment: NEGATIVE
Comment: NORMAL
Neisseria Gonorrhea: NEGATIVE

## 2021-06-30 ENCOUNTER — Ambulatory Visit (INDEPENDENT_AMBULATORY_CARE_PROVIDER_SITE_OTHER): Payer: Medicaid Other | Admitting: Advanced Practice Midwife

## 2021-06-30 VITALS — BP 104/72 | HR 96 | Wt 169.0 lb

## 2021-06-30 DIAGNOSIS — Z3A11 11 weeks gestation of pregnancy: Secondary | ICD-10-CM

## 2021-06-30 DIAGNOSIS — Z3481 Encounter for supervision of other normal pregnancy, first trimester: Secondary | ICD-10-CM | POA: Diagnosis not present

## 2021-06-30 DIAGNOSIS — O219 Vomiting of pregnancy, unspecified: Secondary | ICD-10-CM

## 2021-06-30 DIAGNOSIS — Z3480 Encounter for supervision of other normal pregnancy, unspecified trimester: Secondary | ICD-10-CM | POA: Diagnosis not present

## 2021-06-30 DIAGNOSIS — M5430 Sciatica, unspecified side: Secondary | ICD-10-CM

## 2021-06-30 MED ORDER — SCOPOLAMINE 1 MG/3DAYS TD PT72: 1.0000 | MEDICATED_PATCH | TRANSDERMAL | 12 refills | Status: DC

## 2021-06-30 NOTE — Progress Notes (Signed)
   PRENATAL VISIT NOTE  Subjective:  Shirley Greene is a 26 y.o. [id number] at [redacted]w[redacted]d being seen today for ongoing prenatal care.  She is currently monitored for the following issues for this low-risk pregnancy and has Alpha thalassemia silent carrier; ASCUS with positive high risk HPV cervical on 04/05/2021; and Supervision of other normal pregnancy, antepartum on their problem list.  Patient reports  significant improvement in her nausea and vomiting. Patient has not vomited since her previous visit two weeks ago. She did not like Reglan and discontinued use. She is managing her vomiting with Diclegis and "felt so good I went three days without any medication and that was probably a mistake"  . Continues to experience poor appetite but is snacking when able and feels her work schedule is the primary cause of her eating schedule. Contractions: Not present. Vag. Bleeding: None.  Movement: Absent. Denies leaking of fluid.   Patient reports ongoing sciatic nerve pain. This was also a problem in her previous pregnancy. She previously managed her discomfort with getting off her feet during her work day and plans to start implementing that now.   The following portions of the patient's history were reviewed and updated as appropriate: allergies, current medications, past family history, past medical history, past social history, past surgical history and problem list. Problem list updated.  Objective:   Vitals:   06/30/21 0815  BP: 104/72  Pulse: 96  Weight: 169 lb (76.7 kg)    Fetal Status: Fetal Heart Rate (bpm): 163   Movement: Absent     General:  Alert, oriented and cooperative. Patient is in no acute distress.  Skin: Skin is warm and dry. No rash noted.   Cardiovascular: Normal heart rate noted  Respiratory: Normal respiratory effort, no problems with respiration noted  Abdomen: Soft, gravid, appropriate for gestational age.  Pain/Pressure: Absent     Pelvic: Cervical exam deferred         Extremities: Normal range of motion.  Edema: None  Mental Status: Normal mood and affect. Normal behavior. Normal judgment and thought content.   Assessment and Plan:  Pregnancy: [id number] at [redacted]w[redacted]d  1. Supervision of other normal pregnancy, antepartum - Doing so well! Prenatal labs and Panorama collected today - CBC/D/Plt+RPR+Rh+ABO+RubIgG... - Genetic Screening - TSH - Hemoglobin A1c  2. Vomiting affecting pregnancy - Weight stable - Discussed trial of Scope patch for nausea. Pulled up [email or web address] during appointment to confirm possible out of pocket cost of $30 for 4 patches if not covered by Medicaid - Scopolamine (TRANSDERM-SCOP) 1 MG/3DAYS; Place 1 patch (1.5 mg total) onto the skin every 3 (three) days.  Dispense: 10 patch; Refill: 12  3. Sciatica, unspecified laterality   4. [redacted] weeks gestation of pregnancy   Preterm labor symptoms and general obstetric precautions including but not limited to vaginal bleeding, contractions, leaking of fluid and fetal movement were reviewed in detail with the patient. Please refer to After Visit Summary for other counseling recommendations.  Return for Has next appointment already scheduled :).  Future Appointments  Date Time Provider Department Center  07/14/2021  2:30 PM Calvert Cantor, [place] [facility]  08/11/2021  2:30 PM Calvert Cantor, CNM [facility]  09/08/2021  8:55 AM Calvert Cantor, CNM [facility]    Calvert Cantor, [place]

## 2021-06-30 NOTE — Progress Notes (Signed)
ROB [redacted]w[redacted]d  Pt states she is feeling much better.   Still notes nausea and vomiting. Pt states it's hard to eat small frequent meals throughout the day due to her schedule.

## 2021-07-02 LAB — CBC/D/PLT+RPR+RH+ABO+RUBIGG...
Antibody Screen: NEGATIVE
Basophils Absolute: 0 10*3/uL (ref 0.0–0.2)
Basos: 0 %
EOS (ABSOLUTE): 0.1 10*3/uL (ref 0.0–0.4)
Eos: 1 %
HCV Ab: NONREACTIVE
HIV Screen 4th Generation wRfx: NONREACTIVE
Hematocrit: 35.6 % (ref 34.0–46.6)
Hemoglobin: 11.4 g/dL (ref 11.1–15.9)
Hepatitis B Surface Ag: NEGATIVE
Immature Grans (Abs): 0 10*3/uL (ref 0.0–0.1)
Immature Granulocytes: 0 %
Lymphocytes Absolute: 2.4 10*3/uL (ref 0.7–3.1)
Lymphs: 26 %
MCH: 23.7 pg — ABNORMAL LOW (ref 26.6–33.0)
MCHC: 32 g/dL (ref 31.5–35.7)
MCV: 74 fL — ABNORMAL LOW (ref 79–97)
Monocytes Absolute: 0.4 10*3/uL (ref 0.1–0.9)
Monocytes: 4 %
Neutrophils Absolute: 6.3 10*3/uL (ref 1.4–7.0)
Neutrophils: 69 %
Platelets: 337 10*3/uL (ref 150–450)
RBC: 4.82 x10E6/uL (ref 3.77–5.28)
RDW: 17.4 % — ABNORMAL HIGH (ref 11.7–15.4)
RPR Ser Ql: NONREACTIVE
Rh Factor: POSITIVE
Rubella Antibodies, IGG: 3 index (ref >0.99)
WBC: 9.3 10*3/uL (ref 3.4–10.8)

## 2021-07-02 LAB — HCV INTERPRETATION

## 2021-07-02 LAB — TSH: TSH: 3.29 u[IU]/mL (ref 0.450–4.500)

## 2021-07-02 LAB — HEMOGLOBIN A1C
Est. average glucose Bld gHb Est-mCnc: 123 mg/dL
Hgb A1c MFr Bld: 5.9 % — ABNORMAL HIGH (ref 4.8–5.6)

## 2021-07-05 ENCOUNTER — Encounter: Payer: Medicaid Other | Admitting: Obstetrics & Gynecology

## 2021-07-07 ENCOUNTER — Encounter: Payer: Self-pay | Admitting: *Deleted

## 2021-07-07 ENCOUNTER — Other Ambulatory Visit: Payer: Medicaid Other

## 2021-07-07 DIAGNOSIS — Z3480 Encounter for supervision of other normal pregnancy, unspecified trimester: Secondary | ICD-10-CM

## 2021-07-08 LAB — GLUCOSE TOLERANCE, 2 HOURS W/ 1HR
Glucose, 1 hour: 137 mg/dL (ref 70–179)
Glucose, 2 hour: 133 mg/dL (ref 70–152)
Glucose, Fasting: 93 mg/dL — ABNORMAL HIGH (ref 70–91)

## 2021-07-11 ENCOUNTER — Encounter: Payer: Self-pay | Admitting: Obstetrics and Gynecology

## 2021-07-11 DIAGNOSIS — O24410 Gestational diabetes mellitus in pregnancy, diet controlled: Secondary | ICD-10-CM | POA: Insufficient documentation

## 2021-07-11 DIAGNOSIS — O24419 Gestational diabetes mellitus in pregnancy, unspecified control: Secondary | ICD-10-CM | POA: Insufficient documentation

## 2021-07-11 HISTORY — DX: Gestational diabetes mellitus in pregnancy, diet controlled: O24.410

## 2021-07-14 ENCOUNTER — Ambulatory Visit (INDEPENDENT_AMBULATORY_CARE_PROVIDER_SITE_OTHER): Payer: Medicaid Other | Admitting: Advanced Practice Midwife

## 2021-07-14 VITALS — BP 112/75 | HR 103 | Wt 168.0 lb

## 2021-07-14 DIAGNOSIS — M5441 Lumbago with sciatica, right side: Secondary | ICD-10-CM

## 2021-07-14 DIAGNOSIS — O219 Vomiting of pregnancy, unspecified: Secondary | ICD-10-CM

## 2021-07-14 DIAGNOSIS — O24410 Gestational diabetes mellitus in pregnancy, diet controlled: Secondary | ICD-10-CM

## 2021-07-14 DIAGNOSIS — M5442 Lumbago with sciatica, left side: Secondary | ICD-10-CM

## 2021-07-14 DIAGNOSIS — Z3480 Encounter for supervision of other normal pregnancy, unspecified trimester: Secondary | ICD-10-CM

## 2021-07-14 MED ORDER — ACCU-CHEK GUIDE VI STRP: ORAL_STRIP | 12 refills | Status: DC

## 2021-07-14 MED ORDER — ACCU-CHEK GUIDE W/DEVICE KIT: 1.0000 | PACK | Freq: Four times a day (QID) | 0 refills | Status: DC

## 2021-07-14 MED ORDER — ACCU-CHEK SOFTCLIX LANCETS MISC: 1.0000 | Freq: Four times a day (QID) | 12 refills | Status: DC

## 2021-07-14 NOTE — Progress Notes (Signed)
ROB 13w3  CC: Pain in right leg 10/10x pain is severe per pt has her in tears would like PT referral.   Pt wants to discuss recent GTT results.    AFP today.

## 2021-07-15 NOTE — Progress Notes (Signed)
   PRENATAL VISIT NOTE  Subjective:  Shirley Greene is a 26 y.o. [id number] at [redacted]w[redacted]d being seen today for ongoing prenatal care.  She is currently monitored for the following issues for this low-risk pregnancy and has Alpha thalassemia silent carrier; ASCUS with positive high risk HPV cervical on 04/05/2021; Supervision of other normal pregnancy, antepartum; and GDM dx at 12wks on their problem list.  Patient reports  new onset bilateral hip pain which radiates down her legs. She experienced this is a previous pregnancy. She requests a referral to Physical Therapy .  Contractions: Not present. Vag. Bleeding: None.   . Denies leaking of fluid.   The following portions of the patient's history were reviewed and updated as appropriate: allergies, current medications, past family history, past medical history, past social history, past surgical history and problem list. Problem list updated.  Objective:   Vitals:   07/14/21 1441  BP: 112/75  Pulse: (!) 103  Weight: 168 lb (76.2 kg)    Fetal Status: Fetal Heart Rate (bpm): 152         General:  Alert, oriented and cooperative. Patient is in no acute distress.  Skin: Skin is warm and dry. No rash noted.   Cardiovascular: Normal heart rate noted  Respiratory: Normal respiratory effort, no problems with respiration noted  Abdomen: Soft, gravid, appropriate for gestational age.  Pain/Pressure: Present     Pelvic: Cervical exam deferred        Extremities: Normal range of motion.  Edema: None  Mental Status: Normal mood and affect. Normal behavior. Normal judgment and thought content.   Assessment and Plan:  Pregnancy: [id number] at [redacted]w[redacted]d  1. Supervision of other normal pregnancy, antepartum - Routine care  - [place] MFM OB DETAIL +14 WK; Future  2. Nausea and vomiting in pregnancy prior to [redacted] weeks gestation - Resolved per patient   3. Acute bilateral low back pain with bilateral sciatica  - Ambulatory referral to Physical Therapy  4. Diet  controlled gestational diabetes mellitus (GDM), antepartum - New diagnosis, reviewed impact on prenatal care, MFM surveillance - Referral to [facility]  Preterm labor symptoms and general obstetric precautions including but not limited to vaginal bleeding, contractions, leaking of fluid and fetal movement were reviewed in detail with the patient. Please refer to After Visit Summary for other counseling recommendations.  Return in about 4 weeks (around 08/11/2021).  Future Appointments  Date Time Provider Department Center  07/27/2021  9:00 AM [facility] GDM CLASS [facility]  08/11/2021  2:30 PM Calvert Cantor, CNM [facility]  08/22/2021  9:45 AM [facility] US4 [facility]  09/08/2021  8:55 AM Calvert Cantor, CNM [facility]  10/06/2021 11:15 AM Calvert Cantor, CNM [facility]    Calvert Cantor, CNM

## 2021-07-27 ENCOUNTER — Encounter: Payer: Medicaid Other | Attending: Advanced Practice Midwife | Admitting: Registered"

## 2021-07-27 ENCOUNTER — Encounter: Payer: Self-pay | Admitting: Registered"

## 2021-07-27 DIAGNOSIS — O24410 Gestational diabetes mellitus in pregnancy, diet controlled: Secondary | ICD-10-CM | POA: Diagnosis not present

## 2021-07-27 DIAGNOSIS — Z3A00 Weeks of gestation of pregnancy not specified: Secondary | ICD-10-CM | POA: Diagnosis not present

## 2021-07-27 DIAGNOSIS — O24419 Gestational diabetes mellitus in pregnancy, unspecified control: Secondary | ICD-10-CM

## 2021-07-27 NOTE — Progress Notes (Signed)
Patient was seen on 07/27/21 for Gestational Diabetes self-management class [facility]. The following learning objectives were met by the patient during this course:  States the definition of Gestational Diabetes States why dietary management is important in controlling blood glucose Describes the effects each nutrient has on blood glucose levels Demonstrates ability to create a balanced meal plan Demonstrates carbohydrate counting  States when to check blood glucose levels Demonstrates proper blood glucose monitoring techniques States the effect of stress and exercise on blood glucose levels States the importance of limiting caffeine and abstaining from alcohol and smoking  Blood glucose monitor given: None. Patient brought meter to class for instruction CBG: 121 mg/dL  Patient instructed to monitor glucose levels: FBS: 60 - <95; 1 hour: <140; 2 hour: <120  Patient received handouts: Nutrition Diabetes and Pregnancy, including carb counting list  Patient will be seen for follow-up as needed.

## 2021-08-11 ENCOUNTER — Ambulatory Visit (INDEPENDENT_AMBULATORY_CARE_PROVIDER_SITE_OTHER): Payer: Medicaid Other | Admitting: Advanced Practice Midwife

## 2021-08-11 VITALS — BP 134/75 | HR 106 | Wt 166.0 lb

## 2021-08-11 DIAGNOSIS — R109 Unspecified abdominal pain: Secondary | ICD-10-CM

## 2021-08-11 DIAGNOSIS — Z3480 Encounter for supervision of other normal pregnancy, unspecified trimester: Secondary | ICD-10-CM

## 2021-08-11 DIAGNOSIS — O26899 Other specified pregnancy related conditions, unspecified trimester: Secondary | ICD-10-CM

## 2021-08-11 DIAGNOSIS — Z3A17 17 weeks gestation of pregnancy: Secondary | ICD-10-CM

## 2021-08-11 DIAGNOSIS — O24410 Gestational diabetes mellitus in pregnancy, diet controlled: Secondary | ICD-10-CM

## 2021-08-11 NOTE — Progress Notes (Signed)
   PRENATAL VISIT NOTE  Subjective:  Shirley Greene is a 26 y.o. [id number] at [redacted]w[redacted]d being seen today for ongoing prenatal care.  She is currently monitored for the following issues for this high-risk pregnancy and has Alpha thalassemia silent carrier; ASCUS with positive high risk HPV cervical on 04/05/2021; Supervision of other normal pregnancy, antepartum; and GDM dx at 12wks on their problem list.  Patient reports  abdominal cramping. She denies vaginal spotting or bleeding. She denies abnormal vaginal discharge .  Contractions: Not present. Vag. Bleeding: None.  Movement: Present. Denies leaking of fluid.   Patient's pregnancy is c/b A1GDM. She states the class was helpful and she has significantly improved her nutrition. She is not checking CBGs due to being very busy and forgetting to do so. She plans to start checking CBGs soon.  The following portions of the patient's history were reviewed and updated as appropriate: allergies, current medications, past family history, past medical history, past social history, past surgical history and problem list. Problem list updated.  Objective:   Vitals:   08/11/21 1414  BP: 134/75  Pulse: (!) 106  Weight: 166 lb (75.3 kg)    Fetal Status: Fetal Heart Rate (bpm): 156   Movement: Present     General:  Alert, oriented and cooperative. Patient is in no acute distress.  Skin: Skin is warm and dry. No rash noted.   Cardiovascular: Normal heart rate noted  Respiratory: Normal respiratory effort, no problems with respiration noted  Abdomen: Soft, gravid, appropriate for gestational age.  Pain/Pressure: Present     Pelvic: Cervical exam deferred        Extremities: Normal range of motion.  Edema: None  Mental Status: Normal mood and affect. Normal behavior. Normal judgment and thought content.   Assessment and Plan:  Pregnancy: [id number] at [redacted]w[redacted]d  1. Supervision of other normal pregnancy, antepartum - Routine care - Anatomy scan already  scheduled  2. Diet controlled gestational diabetes mellitus (GDM), antepartum - Not checking CBGs but has improved nutrition - Appreciate her honesty, continue with plan to start checking to keep patient and baby safe  3. Abdominal cramping affecting pregnancy - Not associated bleeding, abnormal discharge or dysuria - Likely related to high heat and humidity this week - Advised increasing PO intake, periods of rest through  4. [redacted] weeks gestation of pregnancy   Preterm labor symptoms and general obstetric precautions including but not limited to vaginal bleeding, contractions, leaking of fluid and fetal movement were reviewed in detail with the patient. Please refer to After Visit Summary for other counseling recommendations.  Return in about 4 weeks (around 09/08/2021) for MD or APP.  Future Appointments  Date Time Provider Department Center  08/22/2021  9:45 AM [facility] US4 [facility]  09/08/2021  8:55 AM Calvert Cantor, CNM [facility]  10/06/2021 11:15 AM Calvert Cantor, CNM [facility]  11/03/2021 11:15 AM Calvert Cantor, CNM [facility]    Calvert Cantor, [place]

## 2021-08-11 NOTE — Progress Notes (Signed)
ROB [redacted]w[redacted]d

## 2021-08-22 ENCOUNTER — Other Ambulatory Visit: Payer: Self-pay | Admitting: *Deleted

## 2021-08-22 ENCOUNTER — Ambulatory Visit: Payer: Medicaid Other | Admitting: *Deleted

## 2021-08-22 ENCOUNTER — Ambulatory Visit: Payer: Medicaid Other | Attending: Advanced Practice Midwife

## 2021-08-22 ENCOUNTER — Encounter: Payer: Self-pay | Admitting: *Deleted

## 2021-08-22 VITALS — BP 123/59 | HR 93

## 2021-08-22 DIAGNOSIS — Z3689 Encounter for other specified antenatal screening: Secondary | ICD-10-CM | POA: Insufficient documentation

## 2021-08-22 DIAGNOSIS — O24410 Gestational diabetes mellitus in pregnancy, diet controlled: Secondary | ICD-10-CM | POA: Diagnosis not present

## 2021-08-22 DIAGNOSIS — O321XX0 Maternal care for breech presentation, not applicable or unspecified: Secondary | ICD-10-CM | POA: Insufficient documentation

## 2021-08-22 DIAGNOSIS — Z3480 Encounter for supervision of other normal pregnancy, unspecified trimester: Secondary | ICD-10-CM | POA: Diagnosis not present

## 2021-08-22 DIAGNOSIS — O99212 Obesity complicating pregnancy, second trimester: Secondary | ICD-10-CM

## 2021-08-22 DIAGNOSIS — O4402 Placenta previa specified as without hemorrhage, second trimester: Secondary | ICD-10-CM

## 2021-08-22 DIAGNOSIS — Z148 Genetic carrier of other disease: Secondary | ICD-10-CM | POA: Diagnosis not present

## 2021-08-22 DIAGNOSIS — Z362 Encounter for other antenatal screening follow-up: Secondary | ICD-10-CM

## 2021-08-22 DIAGNOSIS — Z3A19 19 weeks gestation of pregnancy: Secondary | ICD-10-CM | POA: Diagnosis not present

## 2021-08-25 ENCOUNTER — Encounter: Payer: Self-pay | Admitting: Advanced Practice Midwife

## 2021-08-25 DIAGNOSIS — O4400 Placenta previa specified as without hemorrhage, unspecified trimester: Secondary | ICD-10-CM | POA: Insufficient documentation

## 2021-08-25 DIAGNOSIS — O4420 Partial placenta previa NOS or without hemorrhage, unspecified trimester: Secondary | ICD-10-CM | POA: Insufficient documentation

## 2021-08-25 DIAGNOSIS — O4413 Placenta previa with hemorrhage, third trimester: Secondary | ICD-10-CM | POA: Insufficient documentation

## 2021-09-08 ENCOUNTER — Telehealth: Payer: Self-pay

## 2021-09-08 ENCOUNTER — Ambulatory Visit (INDEPENDENT_AMBULATORY_CARE_PROVIDER_SITE_OTHER): Payer: Medicaid Other | Admitting: Advanced Practice Midwife

## 2021-09-08 VITALS — BP 105/70 | HR 105 | Wt 168.0 lb

## 2021-09-08 DIAGNOSIS — O26899 Other specified pregnancy related conditions, unspecified trimester: Secondary | ICD-10-CM

## 2021-09-08 DIAGNOSIS — R109 Unspecified abdominal pain: Secondary | ICD-10-CM

## 2021-09-08 DIAGNOSIS — Z98891 History of uterine scar from previous surgery: Secondary | ICD-10-CM

## 2021-09-08 DIAGNOSIS — O4402 Placenta previa specified as without hemorrhage, second trimester: Secondary | ICD-10-CM

## 2021-09-08 DIAGNOSIS — R350 Frequency of micturition: Secondary | ICD-10-CM | POA: Diagnosis not present

## 2021-09-08 DIAGNOSIS — O24419 Gestational diabetes mellitus in pregnancy, unspecified control: Secondary | ICD-10-CM

## 2021-09-08 DIAGNOSIS — Z3480 Encounter for supervision of other normal pregnancy, unspecified trimester: Secondary | ICD-10-CM

## 2021-09-08 DIAGNOSIS — O24410 Gestational diabetes mellitus in pregnancy, diet controlled: Secondary | ICD-10-CM | POA: Diagnosis not present

## 2021-09-08 DIAGNOSIS — O26892 Other specified pregnancy related conditions, second trimester: Secondary | ICD-10-CM

## 2021-09-08 DIAGNOSIS — Z3A21 21 weeks gestation of pregnancy: Secondary | ICD-10-CM | POA: Diagnosis not present

## 2021-09-08 LAB — POCT URINALYSIS DIPSTICK
Blood, UA: NEGATIVE
Glucose, UA: NEGATIVE
Ketones, UA: NEGATIVE
Nitrite, UA: NEGATIVE
Protein, UA: NEGATIVE
Spec Grav, UA: >=1.03 — AB (ref 1.010–1.025)
Urobilinogen, UA: 1 E.U./dL
pH, UA: 6.5 (ref 5.0–8.0)

## 2021-09-08 LAB — GLUCOSE, POCT (MANUAL RESULT ENTRY): POC Glucose: 101 mg/dl — AB (ref 70–99)

## 2021-09-08 MED ORDER — METFORMIN HCL 500 MG PO TABS: 500.0000 mg | ORAL_TABLET | Freq: Two times a day (BID) | ORAL | 5 refills | Status: DC

## 2021-09-08 NOTE — Telephone Encounter (Signed)
Left message for pt to call office back regarding changed appointments in the future.

## 2021-09-08 NOTE — Progress Notes (Signed)
PRENATAL VISIT NOTE  Subjective:  Shirley Greene is a 26 y.o. [id number] at [redacted]w[redacted]d being seen today for ongoing prenatal care.  She is currently monitored for the following issues for this high-risk pregnancy and has Alpha thalassemia silent carrier; ASCUS with positive high risk HPV cervical on 04/05/2021; Supervision of other normal pregnancy, antepartum; GDM dx at 12wks; Placenta previa; and GDM, class A2 on their problem list.  Patient reports  multiple episodes of feeling "sick and weak". Not accompanied by vomiting .  She is very busy with work and caring for her sons and has difficulty checking her blood sugar on a regular basis. She endorses readings in the 150s. 24 hour diet recall includes [employer] for breakfast, McDonald's for lunch and a slice of cake for dinner around 9pm.   Patient also reports frequent urination. She states she can void six times in one hour. She has talked to her mom, who is diabetic, and her mom has shared that she thinks many of patient's symptoms are related to her sugar control.   Contractions: Irritability. Vag. Bleeding: None.  Movement: Present. Denies leaking of fluid.   The following portions of the patient's history were reviewed and updated as appropriate: allergies, current medications, past family history, past medical history, past social history, past surgical history and problem list. Problem list updated.  Objective:   Vitals:   09/08/21 0902  BP: 105/70  Pulse: (!) 105  Weight: 168 lb (76.2 kg)    Fetal Status: Fetal Heart Rate (bpm): 148   Movement: Present     General:  Alert, oriented and cooperative. Patient is in no acute distress.  Skin: Skin is warm and dry. No rash noted.   Cardiovascular: Normal heart rate noted  Respiratory: Normal respiratory effort, no problems with respiration noted  Abdomen: Soft, gravid, appropriate for gestational age.  Pain/Pressure: Present     Pelvic: Cervical exam deferred        Extremities:  Normal range of motion.  Edema: None  Mental Status: Normal mood and affect. Normal behavior. Normal judgment and thought content.   Assessment and Plan:  Pregnancy: [id number] at [redacted]w[redacted]d  1. Supervision of other normal pregnancy, antepartum - Routine care   2. Urine frequency - Negative for ketones - Discussed physiology of frequent urination when serum glucose is high - POCT Urinalysis Dipstick - Culture, OB Urine  3. Diet controlled gestational diabetes mellitus (GDM), antepartum   4. GDM, class A2 - Please don't drive if feeling weak or near fainting, low threshold for evaluation in MAU - CBGs 150s, fasting in office 101, vitals otherwise stable - Discussed Metformin initiation, common patient report of diarrhea, Imodium as needed - Can empathize with busy evening schedule. Drive through food ok, look for clean protein e.g. [employer] grilled nuggets instead of [employer] fried nuggets - Confirmed indication and dosage with Dr. Para March - metFORMIN (GLUCOPHAGE) 500 MG tablet; Take 1 tablet (500 mg total) by mouth 2 (two) times daily with a meal.  Dispense: 60 tablet; Refill: 5 - POCT Glucose (CBG)  5. Hx of cesarean section x 2 - For repeat cesarean.  - Reviewed timing of 39 weeks unless poorly controlled A2GDM per MFM  6. Abdominal cramping affecting pregnancy - Encouraged increased hydration with water, investigate zero calorie flavored/sparkling water - Preterm labor precautions, indications for evaluation in MAU  7. Placenta previa in second trimester - Ongoing surveillance with MFM - Confirmed it is important to continue to track placenta location even though  repeat cesarean  Preterm labor symptoms and general obstetric precautions including but not limited to vaginal bleeding, contractions, leaking of fluid and fetal movement were reviewed in detail with the patient. Please refer to After Visit Summary for other counseling recommendations.   Return in about 1 week (around  09/15/2021) --For MD in one week to review CBGs after starting Metformin --Next HROB appt in 4 weeks move to MD schedule please.  Future Appointments  Date Time Provider Department Center  09/12/2021 10:15 AM Desenglau, Shireen Quan, PT [facility] None  09/21/2021  3:30 PM [facility] NURSE [facility]  09/21/2021  3:45 PM [facility] US4 [facility]    Calvert Cantor, CNM

## 2021-09-08 NOTE — Progress Notes (Signed)
ROB [redacted]w[redacted]d  CC: pt notes being light headed, feeling as if she is going to pass out, frequent urination, BH contractions that come and go.   Blood sugars have been in the 150 range per pt . Unsure of fasting.

## 2021-09-10 LAB — URINE CULTURE, OB REFLEX

## 2021-09-10 LAB — CULTURE, OB URINE

## 2021-09-11 NOTE — Therapy (Deleted)
OUTPATIENT PHYSICAL THERAPY FEMALE PELVIC EVALUATION   Patient Name: Shirley Greene MRN: [id number] DOB:[birth year], 26 y.o., female Today's Date: 09/11/2021    Past Medical History:  Diagnosis Date   Alpha thalassemia silent carrier 05/27/2018   Anemia    Cervical dysplasia 02/15/2017   02/2018: pap neg   Chlamydia    Infection    UTI   Sciatica 07/16/2018   Past Surgical History:  Procedure Laterality Date   CESAREAN SECTION N/A 04/05/2012   Procedure: CESAREAN SECTION;  Surgeon: Tilda Burrow, MD;  Location: [facility];  Service: Obstetrics;  Laterality: N/A;   CESAREAN SECTION WITH BILATERAL TUBAL LIGATION N/A 11/28/2018   Procedure: cesarean section;  Surgeon: Hermina Staggers, MD;  Location: [facility];  Service: Obstetrics;  Laterality: N/A;   DILATION AND EVACUATION N/A 11/19/2020   Procedure: DILATATION AND EVACUATION;  Surgeon: Myna Hidalgo, DO;  Location: [facility];  Service: Gynecology;  Laterality: N/A;   OPERATIVE ULTRASOUND N/A 11/19/2020   Procedure: OPERATIVE ULTRASOUND;  Surgeon: Myna Hidalgo, DO;  Location: [facility];  Service: Gynecology;  Laterality: N/A;   Patient Active Problem List   Diagnosis Date Noted   GDM, class A2 09/08/2021   Placenta previa 08/25/2021   GDM dx at 12wks 07/11/2021   Supervision of other normal pregnancy, antepartum 05/26/2021   ASCUS with positive high risk HPV cervical on 04/05/2021 04/10/2021   Alpha thalassemia silent carrier 05/27/2018    PCP: Calvert Cantor, CNM  REFERRING PROVIDER: Calvert Cantor, CNM  REFERRING DIAG:  [phone] (ICD-10-CM) - Supervision of other normal pregnancy, antepartum  M54.42,M54.41 (ICD-10-CM) - Acute bilateral low back pain with bilateral sciatica    THERAPY DIAG:  No diagnosis found.  Rationale for Evaluation and Treatment Rehabilitation  ONSET DATE: ***  SUBJECTIVE:                                                                                                                                                                                            SUBJECTIVE STATEMENT: *** Fluid intake: {Yes/No:304960894}    PAIN:  Are you having pain? {yes/no:20286} NPRS scale: ***/10 Pain location: {pelvic pain location:27098}  Pain type: {type:313116} Pain description: {PAIN DESCRIPTION:21022940}   Aggravating factors: *** Relieving factors: ***  PRECAUTIONS: {Therapy precautions:24002}  WEIGHT BEARING RESTRICTIONS {Yes ***/No:24003}  FALLS:  Has patient fallen in last 6 months? {fallsyesno:27318}  LIVING ENVIRONMENT: Lives with: {[facility] lives with:25569::"lives with their family"} Lives in: {Lives in:25570} Stairs: {opstairs:27293} Has following equipment at home: {Assistive devices:23999}  OCCUPATION: ***  PLOF: {PLOF:24004}  PATIENT GOALS ***  PERTINENT HISTORY:  *** Sexual abuse: {Yes/No:304960894}  BOWEL  MOVEMENT Pain with bowel movement: {yes/no:20286} Type of bowel movement:{PT BM type:27100} Fully empty rectum: {Yes/No:304960894} Leakage: {Yes/No:304960894} Pads: {Yes/No:304960894} Fiber supplement: {Yes/No:304960894}  URINATION Pain with urination: {yes/no:20286} Fully empty bladder: {Yes/No:304960894} Stream: {PT urination:27102} Urgency: {Yes/No:304960894} Frequency: *** Leakage: {PT leakage:27103} Pads: {Yes/No:304960894}  INTERCOURSE Pain with intercourse: {pain with intercourse PA:27099} Ability to have vaginal penetration:  {Yes/No:304960894} Climax: *** Marinoff Scale: ***/3  PREGNANCY Vaginal deliveries *** Tearing {Yes***/No:304960894} C-section deliveries *** Currently pregnant {Yes***/No:304960894}  PROLAPSE {PT prolapse:27101}    OBJECTIVE:   DIAGNOSTIC FINDINGS:  ***  PATIENT SURVEYS:  {rehab surveys:24030}  PFIQ-7 ***  COGNITION:  Overall cognitive status: {cognition:24006}     SENSATION:  Light touch: {intact/deficits:24005}  Proprioception: {intact/deficits:24005}  MUSCLE LENGTH: Hamstrings:  Right *** deg; Left *** deg Thomas test: Right *** deg; Left *** deg  LUMBAR SPECIAL TESTS:  {lumbar special test:25242}  FUNCTIONAL TESTS:  {Functional tests:24029}  GAIT: Distance walked: *** Assistive device utilized: {Assistive devices:23999} Level of assistance: {Levels of assistance:24026} Comments: ***               POSTURE: {posture:25561}   PELVIC ALIGNMENT:  LUMBARAROM/PROM  A/PROM A/PROM  eval  Flexion   Extension   Right lateral flexion   Left lateral flexion   Right rotation   Left rotation    (Blank rows = not tested)  LOWER EXTREMITY ROM:  {AROM/PROM:27142} ROM Right eval Left eval  Hip flexion    Hip extension    Hip abduction    Hip adduction    Hip internal rotation    Hip external rotation    Knee flexion    Knee extension    Ankle dorsiflexion    Ankle plantarflexion    Ankle inversion    Ankle eversion     (Blank rows = not tested)  LOWER EXTREMITY MMT:  MMT Right eval Left eval  Hip flexion    Hip extension    Hip abduction    Hip adduction    Hip internal rotation    Hip external rotation    Knee flexion    Knee extension    Ankle dorsiflexion    Ankle plantarflexion    Ankle inversion    Ankle eversion      PALPATION:   General  ***                External Perineal Exam ***                             Internal Pelvic Floor ***  Patient confirms identification and approves PT to assess internal pelvic floor and treatment {yes/no:20286}  PELVIC MMT:   MMT eval  Vaginal   Internal Anal Sphincter   External Anal Sphincter   Puborectalis   Diastasis Recti   (Blank rows = not tested)        TONE: ***  PROLAPSE: ***  TODAY'S TREATMENT  EVAL ***   PATIENT EDUCATION:  Education details: *** Person educated: {Person educated:25204} Education method: {Education Method:25205} Education comprehension: {Education Comprehension:25206}   HOME EXERCISE PROGRAM: ***  ASSESSMENT:  CLINICAL  IMPRESSION: Patient is a *** y.o. *** who was seen today for physical therapy evaluation and treatment for ***.    OBJECTIVE IMPAIRMENTS {opptimpairments:25111}.   ACTIVITY LIMITATIONS {activitylimitations:27494}  PARTICIPATION LIMITATIONS: {participationrestrictions:25113}  PERSONAL FACTORS {Personal factors:25162} are also affecting patient's functional outcome.   REHAB POTENTIAL: {rehabpotential:25112}  CLINICAL DECISION MAKING: {clinical decision making:25114}  EVALUATION COMPLEXITY: {Evaluation  complexity:25115}   GOALS: Goals reviewed with patient? {yes/no:20286}  SHORT TERM GOALS: Target date: {follow up:25551}  *** Baseline: Goal status: {GOALSTATUS:25110}  2.  *** Baseline:  Goal status: {GOALSTATUS:25110}  3.  *** Baseline:  Goal status: {GOALSTATUS:25110}  4.  *** Baseline:  Goal status: {GOALSTATUS:25110}  5.  *** Baseline:  Goal status: {GOALSTATUS:25110}  6.  *** Baseline:  Goal status: {GOALSTATUS:25110}  LONG TERM GOALS: Target date: {follow up:25551}   *** Baseline:  Goal status: {GOALSTATUS:25110}  2.  *** Baseline:  Goal status: {GOALSTATUS:25110}  3.  *** Baseline:  Goal status: {GOALSTATUS:25110}  4.  *** Baseline:  Goal status: {GOALSTATUS:25110}  5.  *** Baseline:  Goal status: {GOALSTATUS:25110}  6.  *** Baseline:  Goal status: {GOALSTATUS:25110}  PLAN: PT FREQUENCY: {rehab frequency:25116}  PT DURATION: {rehab duration:25117}  PLANNED INTERVENTIONS: {rehab planned interventions:25118::"Therapeutic exercises","Therapeutic activity","Neuromuscular re-education","Balance training","Gait training","Patient/Family education","Self Care","Joint mobilization"}  PLAN FOR NEXT SESSION: ***   Brayton Caves Leray Garverick, PT 09/11/2021, 3:59 PM

## 2021-09-12 ENCOUNTER — Ambulatory Visit (INDEPENDENT_AMBULATORY_CARE_PROVIDER_SITE_OTHER): Payer: Medicaid Other | Admitting: Family Medicine

## 2021-09-12 ENCOUNTER — Ambulatory Visit: Payer: Medicaid Other | Attending: Advanced Practice Midwife | Admitting: Physical Therapy

## 2021-09-12 VITALS — BP 102/67 | HR 102 | Wt 168.0 lb

## 2021-09-12 DIAGNOSIS — Z3A22 22 weeks gestation of pregnancy: Secondary | ICD-10-CM

## 2021-09-12 DIAGNOSIS — O24410 Gestational diabetes mellitus in pregnancy, diet controlled: Secondary | ICD-10-CM

## 2021-09-12 DIAGNOSIS — O0992 Supervision of high risk pregnancy, unspecified, second trimester: Secondary | ICD-10-CM

## 2021-09-12 DIAGNOSIS — O4402 Placenta previa specified as without hemorrhage, second trimester: Secondary | ICD-10-CM

## 2021-09-12 DIAGNOSIS — O34219 Maternal care for unspecified type scar from previous cesarean delivery: Secondary | ICD-10-CM | POA: Insufficient documentation

## 2021-09-12 DIAGNOSIS — O0990 Supervision of high risk pregnancy, unspecified, unspecified trimester: Secondary | ICD-10-CM

## 2021-09-12 NOTE — Progress Notes (Signed)
PRENATAL VISIT NOTE  Subjective:  Shirley Greene is a 26 y.o. N4O2703 at [redacted]w[redacted]d being seen today for ongoing prenatal care.  She is currently monitored for the following issues for this high-risk pregnancy and has Alpha thalassemia silent carrier; ASCUS with positive high risk HPV cervical on 04/05/2021; Supervision of other normal pregnancy, antepartum; GDM dx at 12wks; Placenta previa; and Previous cesarean section complicating pregnancy on their problem list.  Patient reports  has been changing her diet and noted improvement .  Contractions: Not present. Vag. Bleeding: None.  Movement: Present. Denies leaking of fluid.   The following portions of the patient's history were reviewed and updated as appropriate: allergies, current medications, past family history, past medical history, past social history, past surgical history and problem list.   Objective:   Vitals:   09/12/21 1504  BP: 102/67  Pulse: (!) 102  Weight: 168 lb (76.2 kg)    Fetal Status: Fetal Heart Rate (bpm): 164   Movement: Present     General:  Alert, oriented and cooperative. Patient is in no acute distress.  Skin: Skin is warm and dry. No rash noted.   Cardiovascular: Normal heart rate noted  Respiratory: Normal respiratory effort, no problems with respiration noted  Abdomen: Soft, gravid, appropriate for gestational age.  Pain/Pressure: Absent     Pelvic: Cervical exam deferred        Extremities: Normal range of motion.  Edema: None  Mental Status: Normal mood and affect. Normal behavior. Normal judgment and thought content.   Assessment and Plan:  Pregnancy: [id number] at [redacted]w[redacted]d 1. Placenta previa in second trimester Has [place] to monitor 1cm over os, posterior location  2. Diet controlled gestational diabetes mellitus (GDM) in second trimester Patient ahd made some diet changes and noted improved sugars.  Currently Fastings are low 100s, PP are 120s Discussed importance of glucose control in pregnancy She  would like to try improved diet control and increased movement to control sugars Reviewed protein with all meals and 6 smaller meals daily She does not eat a lot of meat at baseline but we discussed that chicken and eggs are good sources.    Discussed regular growth [place] to help assure steady growth and fetal size.   Patient understands she may need to start a medication in the future if her sugars are not in range. Fastings < 95 and 2hr PP <120  Patient had DM education visit upcoming Recommended a food log for 3-4 day prior to DM education visit to help make this a better visit to review her individual food choices.  3. Supervision high risk pregnancy, antepartum TWG= -2 lb (-0.907 kg) which is at goal    Preterm labor symptoms and general obstetric precautions including but not limited to vaginal bleeding, contractions, leaking of fluid and fetal movement were reviewed in detail with the patient. Please refer to After Visit Summary for other counseling recommendations.   Return in about 4 weeks (around 10/10/2021) for Routine prenatal care, MD only.  Future Appointments  Date Time Provider Department Center  09/21/2021  3:30 PM [facility] NURSE [facility]  09/21/2021  3:45 PM [facility]  09/29/2021  3:15 PM [facility]  10/11/2021  9:35 AM Milas Hock, MD [facility]  10/31/2021  4:10 PM Federico Flake, MD [facility]  11/14/2021  1:30 PM Federico Flake, MD [facility]  11/28/2021  3:30 PM Federico Flake, MD [facility]  12/12/2021  3:30 PM Anyanwu,  Jethro Bastos, MD [facility]    Federico Flake, MD

## 2021-09-12 NOTE — Progress Notes (Signed)
Would like to discuss doing 30 days of diet change before starting metformin

## 2021-09-20 ENCOUNTER — Other Ambulatory Visit: Payer: Self-pay | Admitting: *Deleted

## 2021-09-20 ENCOUNTER — Inpatient Hospital Stay (HOSPITAL_COMMUNITY)
Admission: AD | Admit: 2021-09-20 | Discharge: 2021-09-20 | Disposition: A | Discharge status: Home / Self Care | Payer: Medicaid Other | Attending: Obstetrics and Gynecology | Admitting: Obstetrics and Gynecology

## 2021-09-20 ENCOUNTER — Encounter (HOSPITAL_COMMUNITY): Payer: Self-pay | Admitting: Obstetrics and Gynecology

## 2021-09-20 DIAGNOSIS — O99012 Anemia complicating pregnancy, second trimester: Secondary | ICD-10-CM | POA: Insufficient documentation

## 2021-09-20 DIAGNOSIS — O26892 Other specified pregnancy related conditions, second trimester: Secondary | ICD-10-CM | POA: Diagnosis not present

## 2021-09-20 DIAGNOSIS — Z3A23 23 weeks gestation of pregnancy: Secondary | ICD-10-CM | POA: Diagnosis not present

## 2021-09-20 DIAGNOSIS — R42 Dizziness and giddiness: Secondary | ICD-10-CM | POA: Diagnosis not present

## 2021-09-20 DIAGNOSIS — O24410 Gestational diabetes mellitus in pregnancy, diet controlled: Secondary | ICD-10-CM | POA: Diagnosis not present

## 2021-09-20 DIAGNOSIS — O99019 Anemia complicating pregnancy, unspecified trimester: Secondary | ICD-10-CM

## 2021-09-20 LAB — CBC WITH DIFFERENTIAL/PLATELET
Abs Immature Granulocytes: 0.05 10*3/uL (ref 0.00–0.07)
Basophils Absolute: 0 10*3/uL (ref 0.0–0.1)
Basophils Relative: 0 %
Eosinophils Absolute: 0 10*3/uL (ref 0.0–0.5)
Eosinophils Relative: 0 %
HCT: 30.2 % — ABNORMAL LOW (ref 36.0–46.0)
Hemoglobin: 9.3 g/dL — ABNORMAL LOW (ref 12.0–15.0)
Immature Granulocytes: 1 %
Lymphocytes Relative: 15 %
Lymphs Abs: 1.1 10*3/uL (ref 0.7–4.0)
MCH: 22.5 pg — ABNORMAL LOW (ref 26.0–34.0)
MCHC: 30.8 g/dL (ref 30.0–36.0)
MCV: 72.9 fL — ABNORMAL LOW (ref 80.0–100.0)
Monocytes Absolute: 0.5 10*3/uL (ref 0.1–1.0)
Monocytes Relative: 8 %
Neutro Abs: 5.4 10*3/uL (ref 1.7–7.7)
Neutrophils Relative %: 76 %
Platelets: 308 10*3/uL (ref 150–400)
RBC: 4.14 MIL/uL (ref 3.87–5.11)
RDW: 17.1 % — ABNORMAL HIGH (ref 11.5–15.5)
WBC: 7.1 10*3/uL (ref 4.0–10.5)
nRBC: 0.3 % — ABNORMAL HIGH (ref 0.0–0.2)

## 2021-09-20 LAB — URINALYSIS, ROUTINE W REFLEX MICROSCOPIC
Bilirubin Urine: NEGATIVE
Glucose, UA: NEGATIVE mg/dL
Hgb urine dipstick: NEGATIVE
Ketones, ur: 15 mg/dL — AB
Leukocytes,Ua: NEGATIVE
Nitrite: NEGATIVE
Protein, ur: NEGATIVE mg/dL
Specific Gravity, Urine: 1.025 (ref 1.005–1.030)
pH: 5.5 (ref 5.0–8.0)

## 2021-09-20 LAB — COMPREHENSIVE METABOLIC PANEL
ALT: 13 U/L (ref 0–44)
AST: 17 U/L (ref 15–41)
Albumin: 3 g/dL — ABNORMAL LOW (ref 3.5–5.0)
Alkaline Phosphatase: 57 U/L (ref 38–126)
Anion gap: 10 (ref 5–15)
BUN: 8 mg/dL (ref 6–20)
CO2: 20 mmol/L — ABNORMAL LOW (ref 22–32)
Calcium: 9.3 mg/dL (ref 8.9–10.3)
Chloride: 107 mmol/L (ref 98–111)
Creatinine, Ser: 0.73 mg/dL (ref 0.44–1.00)
GFR, Estimated: >60 mL/min (ref >60)
Glucose, Bld: 95 mg/dL (ref 70–99)
Potassium: 4 mmol/L (ref 3.5–5.1)
Sodium: 137 mmol/L (ref 135–145)
Total Bilirubin: 0.4 mg/dL (ref 0.3–1.2)
Total Protein: 6.4 g/dL — ABNORMAL LOW (ref 6.5–8.1)

## 2021-09-20 LAB — GLUCOSE, CAPILLARY: Glucose-Capillary: 92 mg/dL (ref 70–99)

## 2021-09-20 MED ORDER — DEXCOM G6 TRANSMITTER MISC: 1.0000 | 2 refills | Status: DC

## 2021-09-20 MED ORDER — DEXCOM G6 SENSOR MISC: 1.0000 | 7 refills | Status: DC

## 2021-09-20 NOTE — MAU Note (Addendum)
.  Shirley Greene is a 26 y.o. at [redacted]w[redacted]d here in MAU reporting: dizziness, lightheadedness, and nausea for the past two weeks. Pt. Reports the dizziness is worse when on feet for extended periods of time. Reported this to OB in office who suggested dietary changes as it may have been r/t GDM that the pt. Has implemented with no improvement. Pt. Reports drinking approx 3 cups of water total per day. Denies HA, BV, RUQ pain, VB, endorses positive FM. LMP: Unknown Onset of complaint: approx 8/22 Pain score: 0/10 Vitals:   09/20/21 1148 09/20/21 1150  BP: (!) 98/57   Pulse: (!) 112   SpO2:  100%     FHT:150s Lab orders placed from triage: UA CBG: 92

## 2021-09-20 NOTE — MAU Provider Note (Signed)
History     CSN: [id number]  Arrival date and time: 09/20/21 1123   Event Date/Time   First Provider Initiated Contact with Patient 09/20/21 1215      Chief Complaint  Patient presents with   Nausea   Dizziness   Shirley Greene, a  26 y.o. [id number] at 108w1dpresents to MAU with complaints of, nausea and dizziness that has been on-going. Patient was dx with GDM at 12 weeks, currently diet controlled. Patient states she has experienced this multiple times with blood sugars of 130-150. But since starting new diet as recommended,  patient states she no longer thinks that they are related. Last meal was today at 0930. Patient states she started a new protein shake, but did not like it so didn't drink most of it and bacon and eggs. Patient states she ate but 30 mins later did not feel any better and decided to come to MAU. She has no other complaints. Endorses + Fetal Movement.          OB History     Gravida  4   Para  2   Term  2   Preterm      AB  1   Living  2      SAB      IAB  1   Ectopic      Multiple  0   Live Births  2        Obstetric Comments  "OP" presentation, got stuck at 9cm         Past Medical History:  Diagnosis Date   Alpha thalassemia silent carrier 05/27/2018   Anemia    Cervical dysplasia 02/15/2017   02/2018: pap neg   Chlamydia    Infection    UTI   Sciatica 07/16/2018    Past Surgical History:  Procedure Laterality Date   CESAREAN SECTION N/A 04/05/2012   Procedure: CESAREAN SECTION;  Surgeon: JJonnie Kind MD;  [facility];  Service: Obstetrics;  Laterality: N/A;   CESAREAN SECTION WITH BILATERAL TUBAL LIGATION N/A 11/28/2018   Procedure: cesarean section;  Surgeon: EChancy Milroy MD;  [facility];  Service: Obstetrics;  Laterality: N/A;   DILATION AND EVACUATION N/A 11/19/2020   Procedure: DILATATION AND EVACUATION;  Surgeon: OJanyth Pupa DO;  [facility]  Service: Gynecology;  Laterality: N/A;    OPERATIVE ULTRASOUND N/A 11/19/2020   Procedure: OPERATIVE ULTRASOUND;  Surgeon: OJanyth Pupa DO;  [facility]  Service: Gynecology;  Laterality: N/A;    Family History  Problem Relation Age of Onset   Hypertension Mother    Hypertension Father    Cancer Maternal Grandfather     Social History   Tobacco Use   Smoking status: Never   Smokeless tobacco: Never  Vaping Use   Vaping Use: Never used  Substance Use Topics   Alcohol use: Not Currently    Comment: twice a month   Drug use: No    Allergies: No Known Allergies  Medications Prior to Admission  Medication Sig Dispense Refill Last Dose   Accu-Chek Softclix Lancets lancets 1 each by Other route 4 (four) times daily. 100 each 12 09/19/2021   Blood Glucose Monitoring Suppl (ACCU-CHEK GUIDE) w/Device KIT 1 Device by Does not apply route 4 (four) times daily. 1 kit 0 09/19/2021   glucose blood (ACCU-CHEK GUIDE) test strip Use to check blood sugars four times a day was instructed 50 each 12 09/19/2021   acetaminophen (  TYLENOL) 500 MG tablet Take 1,000 mg by mouth every 6 (six) hours as needed for mild pain or moderate pain. (Patient not taking: Reported on 06/16/2021)   More than a month   DICLEGIS 10-10 MG TBEC Take 2 tablets by mouth at bedtime. If symptoms persist, add one tablet in the morning and one in the afternoon (Patient not taking: Reported on 09/08/2021) 100 tablet 5 Unknown   metFORMIN (GLUCOPHAGE) 500 MG tablet Take 1 tablet (500 mg total) by mouth 2 (two) times daily with a meal. (Patient not taking: Reported on 09/12/2021) 60 tablet 5 Unknown   metoCLOPramide (REGLAN) 10 MG tablet Take 1 tablet (10 mg total) by mouth every 6 (six) hours. (Patient not taking: Reported on 07/14/2021) 120 tablet 2    promethazine (PHENERGAN) 25 MG tablet Take 1 tablet (25 mg total) by mouth every 6 (six) hours as needed for nausea or vomiting. (Patient not taking: Reported on 06/16/2021) 30 tablet 1 More than a month   scopolamine  (TRANSDERM-SCOP) 1 MG/3DAYS Place 1 patch (1.5 mg total) onto the skin every 3 (three) days. (Patient not taking: Reported on 07/14/2021) 10 patch 12 More than a month    Review of Systems  Constitutional:  Negative for chills, fatigue and fever.  Eyes:  Negative for pain and visual disturbance.  Respiratory:  Negative for apnea, shortness of breath and wheezing.   Cardiovascular:  Negative for chest pain and palpitations.  Gastrointestinal:  Negative for abdominal pain, constipation, diarrhea, nausea and vomiting.  Genitourinary:  Negative for difficulty urinating, dysuria, pelvic pain, vaginal bleeding, vaginal discharge and vaginal pain.  Musculoskeletal:  Negative for back pain.  Neurological:  Positive for dizziness, weakness and light-headedness. Negative for seizures.  Psychiatric/Behavioral:  Negative for suicidal ideas.    Physical Exam   Blood pressure 107/64, pulse 99, temperature 98.5 F (36.9 C), temperature source Oral, resp. rate 18, SpO2 99 %, unknown if currently breastfeeding.  Physical Exam Vitals and nursing note reviewed.  Constitutional:      General: She is not in acute distress.    Appearance: Normal appearance.  HENT:     Head: Normocephalic.  Cardiovascular:     Rate and Rhythm: Normal rate and regular rhythm.  Pulmonary:     Effort: Pulmonary effort is normal.  Abdominal:     Palpations: Abdomen is soft.  Musculoskeletal:     Cervical back: Normal range of motion.  Skin:    General: Skin is warm and dry.  Neurological:     Mental Status: She is alert and oriented to person, place, and time.  Psychiatric:        Mood and Affect: Mood normal.    FHT: 145bpm with moderate variability  Accels present, no decels present. Appropriate for gestational age.   MAU Course  Procedures Orders Placed This Encounter  Procedures   Glucose, capillary   Urinalysis, Routine w reflex microscopic Urine, Clean Catch   CBC with Differential/Platelet    Comprehensive metabolic panel    Results for orders placed or performed during the hospital encounter of 09/20/21 (from the past 24 hour(s))  Glucose, capillary     Status: None   Collection Time: 09/20/21 11:43 AM  Result Value Ref Range   Glucose-Capillary 92 70 - 99 mg/dL  Urinalysis, Routine w reflex microscopic Urine, Clean Catch     Status: Abnormal   Collection Time: 09/20/21 12:04 PM  Result Value Ref Range   Color, Urine YELLOW YELLOW   APPearance HAZY (A)  CLEAR   Specific Gravity, Urine 1.025 1.005 - 1.030   pH 5.5 5.0 - 8.0   Glucose, UA NEGATIVE NEGATIVE mg/dL   Hgb urine dipstick NEGATIVE NEGATIVE   Bilirubin Urine NEGATIVE NEGATIVE   Ketones, ur 15 (A) NEGATIVE mg/dL   Protein, ur NEGATIVE NEGATIVE mg/dL   Nitrite NEGATIVE NEGATIVE   Leukocytes,Ua NEGATIVE NEGATIVE  CBC with Differential/Platelet     Status: Abnormal   Collection Time: 09/20/21 12:34 PM  Result Value Ref Range   WBC 7.1 4.0 - 10.5 K/uL   RBC 4.14 3.87 - 5.11 MIL/uL   Hemoglobin 9.3 (L) 12.0 - 15.0 g/dL   HCT 30.2 (L) 36.0 - 46.0 %   MCV 72.9 (L) 80.0 - 100.0 fL   MCH 22.5 (L) 26.0 - 34.0 pg   MCHC 30.8 30.0 - 36.0 g/dL   RDW 17.1 (H) 11.5 - 15.5 %   Platelets 308 150 - 400 K/uL   nRBC 0.3 (H) 0.0 - 0.2 %   Neutrophils Relative % 76 %   Neutro Abs 5.4 1.7 - 7.7 K/uL   Lymphocytes Relative 15 %   Lymphs Abs 1.1 0.7 - 4.0 K/uL   Monocytes Relative 8 %   Monocytes Absolute 0.5 0.1 - 1.0 K/uL   Eosinophils Relative 0 %   Eosinophils Absolute 0.0 0.0 - 0.5 K/uL   Basophils Relative 0 %   Basophils Absolute 0.0 0.0 - 0.1 K/uL   Immature Granulocytes 1 %   Abs Immature Granulocytes 0.05 0.00 - 0.07 K/uL  Comprehensive metabolic panel     Status: Abnormal   Collection Time: 09/20/21 12:34 PM  Result Value Ref Range   Sodium 137 135 - 145 mmol/L   Potassium 4.0 3.5 - 5.1 mmol/L   Chloride 107 98 - 111 mmol/L   CO2 20 (L) 22 - 32 mmol/L   Glucose, Bld 95 70 - 99 mg/dL   BUN 8 6 - 20 mg/dL    Creatinine, Ser 0.73 0.44 - 1.00 mg/dL   Calcium 9.3 8.9 - 10.3 mg/dL   Total Protein 6.4 (L) 6.5 - 8.1 g/dL   Albumin 3.0 (L) 3.5 - 5.0 g/dL   AST 17 15 - 41 U/L   ALT 13 0 - 44 U/L   Alkaline Phosphatase 57 38 - 126 U/L   Total Bilirubin 0.4 0.3 - 1.2 mg/dL   GFR, Estimated >60 >60 mL/min   Anion gap 10 5 - 15    MDM UA noted for Ketones  Glucose 92 upon arrival to MAU.  No signs of dehydration.  Hbg 9.3- Anemia in Pregnancy  Patient feeling better after eating crackers and peanut butter. Patient stable and Plan for discharge   Assessment and Plan   1. Anemia affecting third pregnancy   2. [redacted] weeks gestation of pregnancy   3. Diet controlled gestational diabetes mellitus (GDM) in second trimester    - Discussed the importance of glucose testing with the dx of GDM. Also discussed side effects of low BS and High BS. Recommended 6 small meals vs 3 large ones.  - Also discussed Anemia in pregnancy and symptoms related to anemia. Recommended to begin oral iron treatment and see how patient patient responds.  -  FHT- appropriate for gestational age at time of discharge.  - Patient discharged home in stable condition and may return to MAU as needed.   Jacquiline Doe, MSN CNM  09/20/2021, 1:47 PM

## 2021-09-21 ENCOUNTER — Ambulatory Visit: Payer: Medicaid Other

## 2021-09-21 ENCOUNTER — Other Ambulatory Visit: Payer: Self-pay | Admitting: *Deleted

## 2021-09-21 MED ORDER — FREESTYLE LIBRE 3 SENSOR MISC: 1.0000 | 3 refills | Status: DC

## 2021-09-22 ENCOUNTER — Encounter: Payer: Self-pay | Admitting: *Deleted

## 2021-09-23 ENCOUNTER — Ambulatory Visit: Payer: Medicaid Other | Admitting: *Deleted

## 2021-09-23 ENCOUNTER — Ambulatory Visit: Payer: Medicaid Other | Attending: Maternal & Fetal Medicine

## 2021-09-23 ENCOUNTER — Ambulatory Visit: Payer: Medicaid Other | Attending: Obstetrics | Admitting: Obstetrics

## 2021-09-23 VITALS — BP 108/60 | HR 89

## 2021-09-23 DIAGNOSIS — O4402 Placenta previa specified as without hemorrhage, second trimester: Secondary | ICD-10-CM

## 2021-09-23 DIAGNOSIS — O34219 Maternal care for unspecified type scar from previous cesarean delivery: Secondary | ICD-10-CM | POA: Diagnosis not present

## 2021-09-23 DIAGNOSIS — Z3A23 23 weeks gestation of pregnancy: Secondary | ICD-10-CM

## 2021-09-23 DIAGNOSIS — Z362 Encounter for other antenatal screening follow-up: Secondary | ICD-10-CM | POA: Diagnosis not present

## 2021-09-23 DIAGNOSIS — O24410 Gestational diabetes mellitus in pregnancy, diet controlled: Secondary | ICD-10-CM | POA: Diagnosis not present

## 2021-09-23 DIAGNOSIS — O24415 Gestational diabetes mellitus in pregnancy, controlled by oral hypoglycemic drugs: Secondary | ICD-10-CM

## 2021-09-23 DIAGNOSIS — O99212 Obesity complicating pregnancy, second trimester: Secondary | ICD-10-CM

## 2021-09-23 DIAGNOSIS — O0990 Supervision of high risk pregnancy, unspecified, unspecified trimester: Secondary | ICD-10-CM

## 2021-09-23 DIAGNOSIS — O285 Abnormal chromosomal and genetic finding on antenatal screening of mother: Secondary | ICD-10-CM

## 2021-09-23 DIAGNOSIS — D563 Thalassemia minor: Secondary | ICD-10-CM

## 2021-09-23 DIAGNOSIS — E669 Obesity, unspecified: Secondary | ICD-10-CM

## 2021-09-23 NOTE — Progress Notes (Signed)
MFM Note  Shirley Greene was seen for a follow up exam as the views of the fetal anatomy were unable to be fully visualized during her last exam.  She was recently prescribed metformin for treatment of gestational diabetes.  A placenta previa was noted on her last exam.  She was informed that the fetal growth and amniotic fluid level appears appropriate for her gestational age.  The views of the fetal anatomy were visualized today.  There were no obvious fetal anomalies noted.    The limitations of ultrasound in the detection of all anomalies was discussed.    The following were discussed during today's consultation:  Gestational diabetes and pregnancy  The implications and management of diabetes in pregnancy was discussed in detail with the patient.    She was advised to continue to monitor her fingersticks 4 times daily (fasting and 2 hours after each meal).    She was advised that our goals for her fingerstick values are fasting values of 90-95 or less and two-hour postprandial values of 120 or less.    The patient was advised that getting her fingerstick values as close to these goals as possible would provide her with the most optimal obstetrical outcome.  The patient was encouraged to start taking the metformin that was prescribed as soon as possible.  Should the majority of her fingerstick results be above these values, her metformin dose may have to be increased or she may have to be switched to insulin to help her achieve better glycemic control.    The increased risk of polyhydramnios, fetal macrosomia, and preeclampsia associated with diabetes was also discussed.    We will continue to follow her with monthly growth ultrasounds.  Weekly fetal testing should be started at around 32 weeks.  Complete placenta previa  The patient has a history of 2 prior cesarean deliveries.  She was advised that based on her history and a complete placenta previa noted today, there is an  increased risk of placenta accreta.  There were no sonographic signs of placenta accreta noted today.  However, she was advised that sonographic signs of the placenta accreta spectrum may show up later in her pregnancy.  She was also advised that although the chances are low, there is the possibility that the placenta previa may resolve later in her pregnancy.  Should placenta accreta be suspected later in her pregnancy, she understands that she may have to be transferred to another institution such as [facility] where a GYN oncologist is available for delivery.    We will make further delivery recommendations based on the appearance of the placenta and placental location later in her pregnancy.  A follow-up exam was scheduled in 4 weeks.  The patient stated that all of her questions have been answered to her satisfaction.    A total of 30 minutes was spent counseling and coordinating the care for this patient.  Greater than 50% of the time was spent in direct face-to-face contact.

## 2021-09-27 ENCOUNTER — Other Ambulatory Visit: Payer: Self-pay | Admitting: *Deleted

## 2021-09-27 DIAGNOSIS — O4402 Placenta previa specified as without hemorrhage, second trimester: Secondary | ICD-10-CM

## 2021-09-27 DIAGNOSIS — O24415 Gestational diabetes mellitus in pregnancy, controlled by oral hypoglycemic drugs: Secondary | ICD-10-CM

## 2021-09-28 ENCOUNTER — Ambulatory Visit: Payer: Medicaid Other | Attending: Advanced Practice Midwife | Admitting: Physical Therapy

## 2021-09-28 NOTE — Therapy (Deleted)
OUTPATIENT PHYSICAL THERAPY FEMALE PELVIC EVALUATION   Patient Name: Shirley Greene MRN: [id number] DOB:Oct 25, 1995, 26 y.o., female Today's Date: 09/28/2021    Past Medical History:  Diagnosis Date   Alpha thalassemia silent carrier 05/27/2018   Anemia    Cervical dysplasia 02/15/2017   02/2018: pap neg   Chlamydia    Infection    UTI   Sciatica 07/16/2018   Past Surgical History:  Procedure Laterality Date   CESAREAN SECTION N/A 04/05/2012   Procedure: CESAREAN SECTION;  Surgeon: Tilda Burrow, MD;  Location: [facility];  Service: Obstetrics;  Laterality: N/A;   CESAREAN SECTION WITH BILATERAL TUBAL LIGATION N/A 11/28/2018   Procedure: cesarean section;  Surgeon: Hermina Staggers, MD;  Location: [facility];  Service: Obstetrics;  Laterality: N/A;   DILATION AND EVACUATION N/A 11/19/2020   Procedure: DILATATION AND EVACUATION;  Surgeon: Myna Hidalgo, DO;  Location: [facility];  Service: Gynecology;  Laterality: N/A;   OPERATIVE ULTRASOUND N/A 11/19/2020   Procedure: OPERATIVE ULTRASOUND;  Surgeon: Myna Hidalgo, DO;  Location: [facility];  Service: Gynecology;  Laterality: N/A;   Patient Active Problem List   Diagnosis Date Noted   Previous cesarean section complicating pregnancy 09/12/2021   Placenta previa 08/25/2021   GDM dx at 12wks 07/11/2021   Supervision of high risk pregnancy, antepartum 05/26/2021   ASCUS with positive high risk HPV cervical on 04/05/2021 04/10/2021   Alpha thalassemia silent carrier 05/27/2018    PCP: Calvert Cantor, CNM  REFERRING PROVIDER: Calvert Cantor, CNM  REFERRING DIAG:  [phone] (ICD-10-CM) - Supervision of other normal pregnancy, antepartum  M54.42,M54.41 (ICD-10-CM) - Acute bilateral low back pain with bilateral sciatica    THERAPY DIAG:  No diagnosis found.  Rationale for Evaluation and Treatment Rehabilitation  ONSET DATE: ***  SUBJECTIVE:                                                                                                                                                                                            SUBJECTIVE STATEMENT: *** Fluid intake: {Yes/No:304960894}    PAIN:  Are you having pain? {yes/no:20286} NPRS scale: ***/10 Pain location: {pelvic pain location:27098}  Pain type: {type:313116} Pain description: {PAIN DESCRIPTION:21022940}   Aggravating factors: *** Relieving factors: ***  PRECAUTIONS: {Therapy precautions:24002}  WEIGHT BEARING RESTRICTIONS {Yes ***/No:24003}  FALLS:  Has patient fallen in last 6 months? {fallsyesno:27318}  LIVING ENVIRONMENT: Lives with: {OPRC lives with:25569::"lives with their family"} Lives in: {Lives in:25570} Stairs: {opstairs:27293} Has following equipment at home: {Assistive devices:23999}  OCCUPATION: ***  PLOF: {PLOF:24004}  PATIENT GOALS ***  PERTINENT HISTORY:  *** Sexual abuse: {Yes/No:304960894}  BOWEL MOVEMENT Pain with bowel movement: {yes/no:20286} Type of bowel movement:{PT BM type:27100} Fully empty rectum: {Yes/No:304960894} Leakage: {Yes/No:304960894} Pads: {Yes/No:304960894} Fiber supplement: {Yes/No:304960894}  URINATION Pain with urination: {yes/no:20286} Fully empty bladder: {Yes/No:304960894} Stream: {PT urination:27102} Urgency: {Yes/No:304960894} Frequency: *** Leakage: {PT leakage:27103} Pads: {Yes/No:304960894}  INTERCOURSE Pain with intercourse: {pain with intercourse PA:27099} Ability to have vaginal penetration:  {Yes/No:304960894} Climax: *** Marinoff Scale: ***/3  PREGNANCY Vaginal deliveries *** Tearing {Yes***/No:304960894} C-section deliveries *** Currently pregnant {Yes***/No:304960894}  PROLAPSE {PT prolapse:27101}    OBJECTIVE:   DIAGNOSTIC FINDINGS:  ***  PATIENT SURVEYS:  {rehab surveys:24030}  PFIQ-7 ***  COGNITION:  Overall cognitive status: {cognition:24006}     SENSATION:  Light touch: {intact/deficits:24005}  Proprioception:  {intact/deficits:24005}  MUSCLE LENGTH: Hamstrings: Right *** deg; Left *** deg Thomas test: Right *** deg; Left *** deg  LUMBAR SPECIAL TESTS:  {lumbar special test:25242}  FUNCTIONAL TESTS:  {Functional tests:24029}  GAIT: Distance walked: *** Assistive device utilized: {Assistive devices:23999} Level of assistance: {Levels of assistance:24026} Comments: ***               POSTURE: {posture:25561}   PELVIC ALIGNMENT:  LUMBARAROM/PROM  A/PROM A/PROM  eval  Flexion   Extension   Right lateral flexion   Left lateral flexion   Right rotation   Left rotation    (Blank rows = not tested)  LOWER EXTREMITY ROM:  {AROM/PROM:27142} ROM Right eval Left eval  Hip flexion    Hip extension    Hip abduction    Hip adduction    Hip internal rotation    Hip external rotation    Knee flexion    Knee extension    Ankle dorsiflexion    Ankle plantarflexion    Ankle inversion    Ankle eversion     (Blank rows = not tested)  LOWER EXTREMITY MMT:  MMT Right eval Left eval  Hip flexion    Hip extension    Hip abduction    Hip adduction    Hip internal rotation    Hip external rotation    Knee flexion    Knee extension    Ankle dorsiflexion    Ankle plantarflexion    Ankle inversion    Ankle eversion      PALPATION:   General  ***                External Perineal Exam ***                             Internal Pelvic Floor ***  Patient confirms identification and approves PT to assess internal pelvic floor and treatment {yes/no:20286}  PELVIC MMT:   MMT eval  Vaginal   Internal Anal Sphincter   External Anal Sphincter   Puborectalis   Diastasis Recti   (Blank rows = not tested)        TONE: ***  PROLAPSE: ***  TODAY'S TREATMENT  EVAL ***   PATIENT EDUCATION:  Education details: *** Person educated: {Person educated:25204} Education method: {Education Method:25205} Education comprehension: {Education Comprehension:25206}   HOME EXERCISE  PROGRAM: ***  ASSESSMENT:  CLINICAL IMPRESSION: Patient is a *** y.o. *** who was seen today for physical therapy evaluation and treatment for ***.    OBJECTIVE IMPAIRMENTS {opptimpairments:25111}.   ACTIVITY LIMITATIONS {activitylimitations:27494}  PARTICIPATION LIMITATIONS: {participationrestrictions:25113}  PERSONAL FACTORS {Personal factors:25162} are also affecting patient's functional outcome.   REHAB POTENTIAL: {rehabpotential:25112}  CLINICAL DECISION MAKING: {clinical decision making:25114}  EVALUATION COMPLEXITY: {  Evaluation complexity:25115}   GOALS: Goals reviewed with patient? {yes/no:20286}  SHORT TERM GOALS: Target date: {follow up:25551}  *** Baseline: Goal status: {GOALSTATUS:25110}  2.  *** Baseline:  Goal status: {GOALSTATUS:25110}  3.  *** Baseline:  Goal status: {GOALSTATUS:25110}  4.  *** Baseline:  Goal status: {GOALSTATUS:25110}  5.  *** Baseline:  Goal status: {GOALSTATUS:25110}  6.  *** Baseline:  Goal status: {GOALSTATUS:25110}  LONG TERM GOALS: Target date: {follow up:25551}   *** Baseline:  Goal status: {GOALSTATUS:25110}  2.  *** Baseline:  Goal status: {GOALSTATUS:25110}  3.  *** Baseline:  Goal status: {GOALSTATUS:25110}  4.  *** Baseline:  Goal status: {GOALSTATUS:25110}  5.  *** Baseline:  Goal status: {GOALSTATUS:25110}  6.  *** Baseline:  Goal status: {GOALSTATUS:25110}  PLAN: PT FREQUENCY: {rehab frequency:25116}  PT DURATION: {rehab duration:25117}  PLANNED INTERVENTIONS: {rehab planned interventions:25118::"Therapeutic exercises","Therapeutic activity","Neuromuscular re-education","Balance training","Gait training","Patient/Family education","Self Care","Joint mobilization"}  PLAN FOR NEXT SESSION: ***   Brayton Caves Noreta Kue, PT 09/28/2021, 3:09 PM

## 2021-09-29 ENCOUNTER — Other Ambulatory Visit: Payer: Medicaid Other | Admitting: Registered"

## 2021-10-06 ENCOUNTER — Encounter: Payer: Medicaid Other | Admitting: Advanced Practice Midwife

## 2021-10-06 NOTE — Progress Notes (Signed)
   PRENATAL VISIT NOTE  Subjective:  Shirley Greene is a 26 y.o. [id number] at [redacted]w[redacted]d being seen today for ongoing prenatal care.  She is currently monitored for the following issues for this high-risk pregnancy and has Alpha thalassemia silent carrier; ASCUS with positive high risk HPV cervical on 04/05/2021; Supervision of high risk pregnancy, antepartum; GDM dx at 12wks; Placenta previa; and Previous cesarean section complicating pregnancy on their problem list.  Patient reports no complaints.  Contractions: Irritability. Vag. Bleeding: None.  Movement: Present. Denies leaking of fluid.   The following portions of the patient's history were reviewed and updated as appropriate: allergies, current medications, past family history, past medical history, past social history, past surgical history and problem list.   Objective:   Vitals:   10/11/21 0940  BP: 110/74  Pulse: (!) 111  Weight: 167 lb (75.8 kg)    Fetal Status: Fetal Heart Rate (bpm): 156   Movement: Present     General:  Alert, oriented and cooperative. Patient is in no acute distress.  Skin: Skin is warm and dry. No rash noted.   Cardiovascular: Normal heart rate noted  Respiratory: Normal respiratory effort, no problems with respiration noted  Abdomen: Soft, gravid, appropriate for gestational age.  Pain/Pressure: Present     Pelvic: Cervical exam deferred        Extremities: Normal range of motion.  Edema: None  Mental Status: Normal mood and affect. Normal behavior. Normal judgment and thought content.   Assessment and Plan:  Pregnancy: [id number] at [redacted]w[redacted]d 1. Supervision of high risk pregnancy, antepartum Discussed birth control plan - desires tubal. Sign papers.  Needs 28w labs (except GTT) - do today Offered and recommended flu shot - pt will do in October.   2. Previous cesarean section complicating pregnancy With previa, will recommend RLTCS. She was already planning this.   3. Placenta previa in second  trimester Complete previa - reviewed unlikely resolve. Reviewed pelvic rest.  Reiterated ongoing monitoring for signs of accreta.  Reviewed bleeding precautions.   4. ASCUS with positive high risk HPV cervical on 04/05/2021 Colpo PP   5. Diet controlled gestational diabetes mellitus (GDM) in second trimester Reviewed CBGs: F 89-91, 2h PP: 118-130, most are less than 120 Current regimen: No medications.  Recommend: NO intervention at this time.  Growth is normal.   Preterm labor symptoms and general obstetric precautions including but not limited to vaginal bleeding, contractions, leaking of fluid and fetal movement were reviewed in detail with the patient. Please refer to After Visit Summary for other counseling recommendations.   No follow-ups on file.  Future Appointments  Date Time Provider [place]  10/24/2021  3:30 PM [facility] NURSE [facility]  10/24/2021  3:45 PM [facility] US4 [facility]  10/31/2021  4:10 PM Caren Macadam, MD [facility]  11/14/2021  1:30 PM Caren Macadam, MD [facility]  11/28/2021  3:30 PM Caren Macadam, MD [facility]  12/12/2021  3:30 PM Anyanwu, Sallyanne Havers, MD [facility]    Radene Gunning, MD

## 2021-10-11 ENCOUNTER — Encounter: Payer: Self-pay | Admitting: Obstetrics and Gynecology

## 2021-10-11 ENCOUNTER — Ambulatory Visit (INDEPENDENT_AMBULATORY_CARE_PROVIDER_SITE_OTHER): Payer: Medicaid Other | Admitting: Obstetrics and Gynecology

## 2021-10-11 VITALS — BP 110/74 | HR 111 | Wt 167.0 lb

## 2021-10-11 DIAGNOSIS — O0990 Supervision of high risk pregnancy, unspecified, unspecified trimester: Secondary | ICD-10-CM

## 2021-10-11 DIAGNOSIS — O4402 Placenta previa specified as without hemorrhage, second trimester: Secondary | ICD-10-CM

## 2021-10-11 DIAGNOSIS — O24410 Gestational diabetes mellitus in pregnancy, diet controlled: Secondary | ICD-10-CM

## 2021-10-11 DIAGNOSIS — R87610 Atypical squamous cells of undetermined significance on cytologic smear of cervix (ASC-US): Secondary | ICD-10-CM

## 2021-10-11 DIAGNOSIS — O34219 Maternal care for unspecified type scar from previous cesarean delivery: Secondary | ICD-10-CM

## 2021-10-11 DIAGNOSIS — Z3A26 26 weeks gestation of pregnancy: Secondary | ICD-10-CM

## 2021-10-11 DIAGNOSIS — R87810 Cervical high risk human papillomavirus (HPV) DNA test positive: Secondary | ICD-10-CM

## 2021-10-11 NOTE — Progress Notes (Signed)
ROB 26w1  Pt wants to discuss her blood sugars. States fasting are doing ok.

## 2021-10-12 LAB — CBC
Hematocrit: 31.4 % — ABNORMAL LOW (ref 34.0–46.6)
Hemoglobin: 9.6 g/dL — ABNORMAL LOW (ref 11.1–15.9)
MCH: 21.7 pg — ABNORMAL LOW (ref 26.6–33.0)
MCHC: 30.6 g/dL — ABNORMAL LOW (ref 31.5–35.7)
MCV: 71 fL — ABNORMAL LOW (ref 79–97)
Platelets: 357 10*3/uL (ref 150–450)
RBC: 4.43 x10E6/uL (ref 3.77–5.28)
RDW: 17.1 % — ABNORMAL HIGH (ref 11.7–15.4)
WBC: 9.4 10*3/uL (ref 3.4–10.8)

## 2021-10-12 LAB — HIV ANTIBODY (ROUTINE TESTING W REFLEX): HIV Screen 4th Generation wRfx: NONREACTIVE

## 2021-10-12 LAB — RPR: RPR Ser Ql: NONREACTIVE

## 2021-10-24 ENCOUNTER — Ambulatory Visit: Payer: Medicaid Other | Attending: Obstetrics

## 2021-10-24 ENCOUNTER — Ambulatory Visit: Payer: Medicaid Other | Admitting: *Deleted

## 2021-10-24 VITALS — BP 111/62 | HR 100

## 2021-10-24 DIAGNOSIS — O285 Abnormal chromosomal and genetic finding on antenatal screening of mother: Secondary | ICD-10-CM

## 2021-10-24 DIAGNOSIS — E669 Obesity, unspecified: Secondary | ICD-10-CM | POA: Diagnosis not present

## 2021-10-24 DIAGNOSIS — Z3A28 28 weeks gestation of pregnancy: Secondary | ICD-10-CM

## 2021-10-24 DIAGNOSIS — O0990 Supervision of high risk pregnancy, unspecified, unspecified trimester: Secondary | ICD-10-CM | POA: Diagnosis not present

## 2021-10-24 DIAGNOSIS — D563 Thalassemia minor: Secondary | ICD-10-CM

## 2021-10-24 DIAGNOSIS — O24410 Gestational diabetes mellitus in pregnancy, diet controlled: Secondary | ICD-10-CM | POA: Diagnosis not present

## 2021-10-24 DIAGNOSIS — O34219 Maternal care for unspecified type scar from previous cesarean delivery: Secondary | ICD-10-CM

## 2021-10-24 DIAGNOSIS — O99212 Obesity complicating pregnancy, second trimester: Secondary | ICD-10-CM

## 2021-10-24 DIAGNOSIS — O24415 Gestational diabetes mellitus in pregnancy, controlled by oral hypoglycemic drugs: Secondary | ICD-10-CM | POA: Diagnosis not present

## 2021-10-24 DIAGNOSIS — O4402 Placenta previa specified as without hemorrhage, second trimester: Secondary | ICD-10-CM | POA: Diagnosis not present

## 2021-10-25 ENCOUNTER — Other Ambulatory Visit: Payer: Self-pay | Admitting: *Deleted

## 2021-10-25 ENCOUNTER — Telehealth: Payer: Self-pay | Admitting: *Deleted

## 2021-10-25 DIAGNOSIS — O4403 Placenta previa specified as without hemorrhage, third trimester: Secondary | ICD-10-CM

## 2021-10-25 NOTE — Telephone Encounter (Signed)
Pt called stating she was told to reach out to the office when she was at her [place] yesterday after reporting she has been contracting some this week and they are painful. Pt states they are around every 30 minutes and go get better once she rests at home. Pt denies any VB or LOF. Advised pt to rest and hydrate this evening and if they continue and become more frequent to go to the hospital for evaluation. Pt verbalizes and understands

## 2021-10-31 ENCOUNTER — Ambulatory Visit (INDEPENDENT_AMBULATORY_CARE_PROVIDER_SITE_OTHER): Payer: Medicaid Other | Admitting: Family Medicine

## 2021-10-31 VITALS — BP 100/67 | HR 101 | Wt 169.0 lb

## 2021-10-31 DIAGNOSIS — O99013 Anemia complicating pregnancy, third trimester: Secondary | ICD-10-CM

## 2021-10-31 DIAGNOSIS — O24410 Gestational diabetes mellitus in pregnancy, diet controlled: Secondary | ICD-10-CM

## 2021-10-31 DIAGNOSIS — O4403 Placenta previa specified as without hemorrhage, third trimester: Secondary | ICD-10-CM

## 2021-10-31 DIAGNOSIS — O43123 Velamentous insertion of umbilical cord, third trimester: Secondary | ICD-10-CM

## 2021-10-31 DIAGNOSIS — O4402 Placenta previa specified as without hemorrhage, second trimester: Secondary | ICD-10-CM | POA: Diagnosis not present

## 2021-10-31 DIAGNOSIS — Z23 Encounter for immunization: Secondary | ICD-10-CM

## 2021-10-31 DIAGNOSIS — Z3A29 29 weeks gestation of pregnancy: Secondary | ICD-10-CM

## 2021-10-31 DIAGNOSIS — O0990 Supervision of high risk pregnancy, unspecified, unspecified trimester: Secondary | ICD-10-CM

## 2021-10-31 DIAGNOSIS — O0993 Supervision of high risk pregnancy, unspecified, third trimester: Secondary | ICD-10-CM

## 2021-10-31 DIAGNOSIS — O43129 Velamentous insertion of umbilical cord, unspecified trimester: Secondary | ICD-10-CM | POA: Insufficient documentation

## 2021-10-31 NOTE — Progress Notes (Signed)
PRENATAL VISIT NOTE  Subjective:  Shirley Greene is a 26 y.o. [id number] at [redacted]w[redacted]d being seen today for ongoing prenatal care.  She is currently monitored for the following issues for this high-risk pregnancy and has Alpha thalassemia silent carrier; ASCUS with positive high risk HPV cervical on 04/05/2021; Supervision of high risk pregnancy, antepartum; GDM dx at 12wks; Partial placenta previa without hemorrhage during pregnancy, antepartum; Previous cesarean section complicating pregnancy; and Velamentous insertion of umbilical cord, antepartum on their problem list.  Patient reports contractions when standing. No bleeding. Contractions: Irritability. Vag. Bleeding: None.  Movement: Present. Denies leaking of fluid.   The following portions of the patient's history were reviewed and updated as appropriate: allergies, current medications, past family history, past medical history, past social history, past surgical history and problem list.   Objective:   Vitals:   10/31/21 1609  BP: 100/67  Pulse: (!) 101  Weight: 169 lb (76.7 kg)    Fetal Status: Fetal Heart Rate (bpm): 165   Movement: Present     General:  Alert, oriented and cooperative. Patient is in no acute distress.  Skin: Skin is warm and dry. No rash noted.   Cardiovascular: Normal heart rate noted  Respiratory: Normal respiratory effort, no problems with respiration noted  Abdomen: Soft, gravid, appropriate for gestational age.  Pain/Pressure: Absent     Pelvic: Cervical exam deferred        Extremities: Normal range of motion.  Edema: None  Mental Status: Normal mood and affect. Normal behavior. Normal judgment and thought content.   Assessment and Plan:  Pregnancy: [id number] at [redacted]w[redacted]d  1. Placenta previa in third trimester Patient has a known central placenta previa documented on the previous 2 [place]. In August it was noted to be posterior previa but NOW is complete and central previa.  MFM recommends CS 36-37  weeks Discussed with patient high risk of accreta even if there is no [place] evidence in the setting of central previa with prior CS x2 Will request [place] on 12/7 (Thursday) with two surgeons present given high risk of accreta.  Reviewed that needing admission to NICU is WNL for infant born at 33-37 weeks via CS to mother with GDM.  Reviewed with patient to prepare for possible hysterectomy with C-section if accreta is encountered at time of CS Patient signed sterilization consent as she desired BTS regardless. - CBC  2. Diet controlled gestational diabetes mellitus (GDM) in third trimester Well controlled per patient HA!C in June was 5.9%, failed 2hr GTT AC on most recent US WNL EFW 11th%  3. Supervision of high risk pregnancy, antepartum Reviewed previa in detail see above - Flu shot today - CBC  4. Anemia complicating pregnancy in third trimester Baseline 9.3-->9.6 (on oral fe) Discussed that I am likely going to be recommending IV Fe given her known need for CS unless today's labs are 100% normal - CBC   Preterm labor symptoms and general obstetric precautions including but not limited to vaginal bleeding, contractions, leaking of fluid and fetal movement were reviewed in detail with the patient. Please refer to After Visit Summary for other counseling recommendations.   Return in about 2 weeks (around 11/14/2021) for Routine prenatal care.  Future Appointments  Date Time Provider [place]  11/14/2021  1:30 PM Caren Macadam, MD [facility]  11/22/2021  3:30 PM [facility] NURSE [facility]  11/22/2021  3:45 PM [facility] US4 [facility]  11/28/2021  3:30 PM Caren Macadam, MD [facility]  [facility]  12/05/2021  3:30 PM [facility] NURSE [facility]  12/05/2021  3:45 PM [facility] US1 [facility]  12/12/2021  3:30 PM Anyanwu, Sallyanne Havers, MD [facility]  12/19/2021  3:30 PM Caren Macadam, MD [facility]    Caren Macadam,  MD

## 2021-11-01 LAB — CBC
Hematocrit: 30.3 % — ABNORMAL LOW (ref 34.0–46.6)
Hemoglobin: 8.9 g/dL — ABNORMAL LOW (ref 11.1–15.9)
MCH: 21 pg — ABNORMAL LOW (ref 26.6–33.0)
MCHC: 29.4 g/dL — ABNORMAL LOW (ref 31.5–35.7)
MCV: 72 fL — ABNORMAL LOW (ref 79–97)
Platelets: 365 10*3/uL (ref 150–450)
RBC: 4.24 x10E6/uL (ref 3.77–5.28)
RDW: 18 % — ABNORMAL HIGH (ref 11.7–15.4)
WBC: 10.3 10*3/uL (ref 3.4–10.8)

## 2021-11-01 NOTE — Addendum Note (Signed)
Addended by: Caryl Bis on: 11/01/2021 01:16 PM   Modules accepted: Orders

## 2021-11-03 ENCOUNTER — Encounter: Payer: Medicaid Other | Admitting: Advanced Practice Midwife

## 2021-11-07 ENCOUNTER — Non-Acute Institutional Stay (HOSPITAL_COMMUNITY)
Admission: RE | Admit: 2021-11-07 | Discharge: 2021-11-07 | Disposition: A | Discharge status: Home / Self Care | Payer: Medicaid Other | Source: Ambulatory Visit | Attending: Internal Medicine | Admitting: Internal Medicine

## 2021-11-07 DIAGNOSIS — O4403 Placenta previa specified as without hemorrhage, third trimester: Secondary | ICD-10-CM | POA: Insufficient documentation

## 2021-11-07 DIAGNOSIS — O99013 Anemia complicating pregnancy, third trimester: Secondary | ICD-10-CM | POA: Diagnosis not present

## 2021-11-07 DIAGNOSIS — Z3A00 Weeks of gestation of pregnancy not specified: Secondary | ICD-10-CM | POA: Insufficient documentation

## 2021-11-07 MED ORDER — DIPHENHYDRAMINE HCL 50 MG/ML IJ SOLN: 25.0000 mg | Freq: Once | INTRAMUSCULAR | Status: DC | PRN

## 2021-11-07 MED ORDER — SODIUM CHLORIDE 0.9 % IV SOLN
300.0000 mg | INTRAVENOUS | Status: DC
  Administered 2021-11-07: 300 mg via INTRAVENOUS
  Filled 2021-11-07: qty 15

## 2021-11-07 MED ORDER — SODIUM CHLORIDE 0.9 % IV BOLUS: 500.0000 mL | Freq: Once | INTRAVENOUS | Status: DC | PRN

## 2021-11-07 MED ORDER — ALBUTEROL SULFATE (2.5 MG/3ML) 0.083% IN NEBU: 2.5000 mg | INHALATION_SOLUTION | Freq: Once | RESPIRATORY_TRACT | Status: DC | PRN

## 2021-11-07 MED ORDER — EPINEPHRINE PF 1 MG/ML IJ SOLN: 0.3000 mg | Freq: Once | INTRAMUSCULAR | Status: DC | PRN

## 2021-11-07 MED ORDER — SODIUM CHLORIDE 0.9 % IV SOLN: INTRAVENOUS | Status: DC | PRN

## 2021-11-07 MED ORDER — METHYLPREDNISOLONE SODIUM SUCC 125 MG IJ SOLR: 125.0000 mg | Freq: Once | INTRAMUSCULAR | Status: DC | PRN

## 2021-11-07 NOTE — Progress Notes (Signed)
PATIENT CARE CENTER NOTE  Diagnosis: Anemia complicating pregnancy in third trimester [O99.013]; Placenta previa found during pregnancy without hemorrhage, third trimester [O44.03]   Provider: Caren Macadam, MD  Procedure: Venofer 300 mg  Note: Patient received Venofer 300 mg infusion (dose # 1 of  3) via PIV. Pre infusion BP was 102/56 and when re-checked BP was 102/58. RN called [facility] [place] regarding pts BP and spoke with Anyanwu, Sallyanne Havers, MD who said it was ok to proceed with venofer infusion. Pt tolerated infusion well with no adverse reaction. Post infusion BP  was 95/49. RN spoke with Anyanwu, Sallyanne Havers, MD again and made her aware of BP. Per MD ok for pt to be discharged. Discharge instructions given. Patient declined to stay for the 1 hour post infusion observation. Patient advised to schedule next appointment at front desk. Alert, oriented and ambulatory at discharge.

## 2021-11-14 ENCOUNTER — Ambulatory Visit (INDEPENDENT_AMBULATORY_CARE_PROVIDER_SITE_OTHER): Payer: Medicaid Other | Admitting: Family Medicine

## 2021-11-14 VITALS — BP 121/71 | HR 109 | Wt 169.0 lb

## 2021-11-14 DIAGNOSIS — O43129 Velamentous insertion of umbilical cord, unspecified trimester: Secondary | ICD-10-CM

## 2021-11-14 DIAGNOSIS — O99019 Anemia complicating pregnancy, unspecified trimester: Secondary | ICD-10-CM

## 2021-11-14 DIAGNOSIS — Z3A31 31 weeks gestation of pregnancy: Secondary | ICD-10-CM | POA: Diagnosis not present

## 2021-11-14 DIAGNOSIS — O4413 Placenta previa with hemorrhage, third trimester: Secondary | ICD-10-CM

## 2021-11-14 DIAGNOSIS — O24410 Gestational diabetes mellitus in pregnancy, diet controlled: Secondary | ICD-10-CM

## 2021-11-14 DIAGNOSIS — O34219 Maternal care for unspecified type scar from previous cesarean delivery: Secondary | ICD-10-CM

## 2021-11-14 DIAGNOSIS — O0993 Supervision of high risk pregnancy, unspecified, third trimester: Secondary | ICD-10-CM | POA: Diagnosis not present

## 2021-11-14 DIAGNOSIS — O0990 Supervision of high risk pregnancy, unspecified, unspecified trimester: Secondary | ICD-10-CM

## 2021-11-14 NOTE — Progress Notes (Signed)
   PRENATAL VISIT NOTE  Subjective:  Shirley Greene is a 26 y.o. [id number] at [redacted]w[redacted]d being seen today for ongoing prenatal care.  She is currently monitored for the following issues for this high-risk pregnancy and has Alpha thalassemia silent carrier; ASCUS with positive high risk HPV cervical on 04/05/2021; Supervision of high risk pregnancy, antepartum; GDM dx at 12wks; Complete placenta previa with hemorrhage, third trimester; Previous cesarean section complicating pregnancy; Velamentous insertion of umbilical cord, antepartum; and Anemia affecting pregnancy, antepartum on their problem list.  Patient reports no complaints.  Contractions: Not present. Vag. Bleeding: None.  Movement: Present. Denies leaking of fluid.   The following portions of the patient's history were reviewed and updated as appropriate: allergies, current medications, past family history, past medical history, past social history, past surgical history and problem list.   Objective:   Vitals:   11/14/21 1332  BP: 121/71  Pulse: (!) 109  Weight: 169 lb (76.7 kg)    Fetal Status: Fetal Heart Rate (bpm): 164   Movement: Present     General:  Alert, oriented and cooperative. Patient is in no acute distress.  Skin: Skin is warm and dry. No rash noted.   Cardiovascular: Normal heart rate noted  Respiratory: Normal respiratory effort, no problems with respiration noted  Abdomen: Soft, gravid, appropriate for gestational age.  Pain/Pressure: Absent     Pelvic: Cervical exam deferred        Extremities: Normal range of motion.  Edema: None  Mental Status: Normal mood and affect. Normal behavior. Normal judgment and thought content.   Assessment and Plan:  Pregnancy: [id number] at [redacted]w[redacted]d 1. Supervision of high risk pregnancy, antepartum Up to date  2. Velamentous insertion of umbilical cord, antepartum MFM follow up  3. Previous cesarean section complicating pregnancy  Has had 2 CS, last in 2020-- noted intraabdominal  adhesions.  Scheduled for 37 weeks (12/22/21) with two attendings given concern for previa/accreta  4. Complete placenta previa with hemorrhage, third trimester Patient has [place] on 11/7 to reassess her complete previa No bleeding since last visit Instructed to go to hospital immediately for bleeding  5. Diet controlled gestational diabetes mellitus (GDM) in third trimester Reports good control [place] 90s 2hr pp typically 119s, highest was 130  6. Anemia Pregnancy - received 10/23 and scheduled for 11/1  - planning for 3 infusions - Repeat CBC in late NOV  Preterm labor symptoms and general obstetric precautions including but not limited to vaginal bleeding, contractions, leaking of fluid and fetal movement were reviewed in detail with the patient. Please refer to After Visit Summary for other counseling recommendations.   Return in about 2 weeks (around 11/28/2021) for Routine prenatal care, MD only.  Future Appointments  Date Time Provider [place]  11/16/2021  8:30 AM [facility] RM 1 [facility] None  11/22/2021  3:30 PM [facility] NURSE [facility]  11/22/2021  3:45 PM [facility] US4 [facility]  11/28/2021  3:30 PM Caren Macadam, MD [facility]  12/05/2021  3:30 PM [facility] NURSE [facility]  12/05/2021  3:45 PM [facility] US1 [facility]  12/12/2021  3:30 PM Anyanwu, Sallyanne Havers, MD [facility]  12/19/2021  3:30 PM Caren Macadam, MD [facility]    Caren Macadam, MD

## 2021-11-14 NOTE — Progress Notes (Signed)
ROB 31w  Pt forgot blood sugar log.

## 2021-11-16 ENCOUNTER — Non-Acute Institutional Stay (HOSPITAL_COMMUNITY)
Admission: RE | Admit: 2021-11-16 | Discharge: 2021-11-16 | Disposition: A | Discharge status: Home / Self Care | Payer: Medicaid Other | Source: Ambulatory Visit | Attending: Internal Medicine | Admitting: Internal Medicine

## 2021-11-16 DIAGNOSIS — O99013 Anemia complicating pregnancy, third trimester: Secondary | ICD-10-CM | POA: Diagnosis not present

## 2021-11-16 DIAGNOSIS — O4403 Placenta previa specified as without hemorrhage, third trimester: Secondary | ICD-10-CM | POA: Insufficient documentation

## 2021-11-16 DIAGNOSIS — D649 Anemia, unspecified: Secondary | ICD-10-CM | POA: Insufficient documentation

## 2021-11-16 MED ORDER — SODIUM CHLORIDE 0.9 % IV SOLN: INTRAVENOUS | Status: DC | PRN

## 2021-11-16 MED ORDER — SODIUM CHLORIDE 0.9 % IV SOLN
300.0000 mg | INTRAVENOUS | Status: DC
  Administered 2021-11-16: 300 mg via INTRAVENOUS
  Filled 2021-11-16: qty 15

## 2021-11-16 NOTE — Progress Notes (Signed)
PATIENT CARE CENTER NOTE:  Provider: Caren Macadam MD  Diagnosis: Anemia complicating pregnancy in third trimester ( O99.013) Placenta previa found during pregnancy without hemorrhage, thirst trimester ( O44.03)  Procedure: Venofer 300mg    Patient received IV Venofer ( dose #2 of 3). No premedications required per orders.  Tolerated well, vitals stable. Pt declined AVS. Instructed pt to schedule final infusion at front desk prior to leaving, pt verbalized understanding.  Patient alert, oriented, and ambulatory at the time of discharge.

## 2021-11-17 ENCOUNTER — Other Ambulatory Visit: Payer: Self-pay

## 2021-11-17 ENCOUNTER — Encounter (HOSPITAL_COMMUNITY): Payer: Self-pay | Admitting: Obstetrics and Gynecology

## 2021-11-17 ENCOUNTER — Inpatient Hospital Stay (HOSPITAL_BASED_OUTPATIENT_CLINIC_OR_DEPARTMENT_OTHER): Payer: Medicaid Other

## 2021-11-17 ENCOUNTER — Inpatient Hospital Stay (HOSPITAL_COMMUNITY)
Admission: AD | Admit: 2021-11-17 | Discharge: 2021-11-17 | Disposition: A | Discharge status: Other Acute Inpatient Hospital | Payer: Medicaid Other | Attending: Obstetrics and Gynecology | Admitting: Obstetrics and Gynecology

## 2021-11-17 DIAGNOSIS — O0990 Supervision of high risk pregnancy, unspecified, unspecified trimester: Secondary | ICD-10-CM

## 2021-11-17 DIAGNOSIS — O43123 Velamentous insertion of umbilical cord, third trimester: Secondary | ICD-10-CM | POA: Diagnosis not present

## 2021-11-17 DIAGNOSIS — O24410 Gestational diabetes mellitus in pregnancy, diet controlled: Secondary | ICD-10-CM

## 2021-11-17 DIAGNOSIS — O4411 Placenta previa with hemorrhage, first trimester: Secondary | ICD-10-CM

## 2021-11-17 DIAGNOSIS — O43129 Velamentous insertion of umbilical cord, unspecified trimester: Secondary | ICD-10-CM

## 2021-11-17 DIAGNOSIS — D563 Thalassemia minor: Secondary | ICD-10-CM | POA: Insufficient documentation

## 2021-11-17 DIAGNOSIS — Z98891 History of uterine scar from previous surgery: Secondary | ICD-10-CM

## 2021-11-17 DIAGNOSIS — O99013 Anemia complicating pregnancy, third trimester: Secondary | ICD-10-CM | POA: Diagnosis not present

## 2021-11-17 DIAGNOSIS — O24419 Gestational diabetes mellitus in pregnancy, unspecified control: Secondary | ICD-10-CM | POA: Insufficient documentation

## 2021-11-17 DIAGNOSIS — D509 Iron deficiency anemia, unspecified: Secondary | ICD-10-CM | POA: Diagnosis not present

## 2021-11-17 DIAGNOSIS — O4413 Placenta previa with hemorrhage, third trimester: Secondary | ICD-10-CM | POA: Insufficient documentation

## 2021-11-17 DIAGNOSIS — O99019 Anemia complicating pregnancy, unspecified trimester: Secondary | ICD-10-CM

## 2021-11-17 DIAGNOSIS — Z3A31 31 weeks gestation of pregnancy: Secondary | ICD-10-CM

## 2021-11-17 DIAGNOSIS — O0993 Supervision of high risk pregnancy, unspecified, third trimester: Secondary | ICD-10-CM | POA: Diagnosis not present

## 2021-11-17 DIAGNOSIS — O34219 Maternal care for unspecified type scar from previous cesarean delivery: Secondary | ICD-10-CM | POA: Diagnosis not present

## 2021-11-17 LAB — CBC
HCT: 32.8 % — ABNORMAL LOW (ref 36.0–46.0)
Hemoglobin: 9.6 g/dL — ABNORMAL LOW (ref 12.0–15.0)
MCH: 21.3 pg — ABNORMAL LOW (ref 26.0–34.0)
MCHC: 29.3 g/dL — ABNORMAL LOW (ref 30.0–36.0)
MCV: 72.7 fL — ABNORMAL LOW (ref 80.0–100.0)
Platelets: 257 10*3/uL (ref 150–400)
RBC: 4.51 MIL/uL (ref 3.87–5.11)
RDW: 23.8 % — ABNORMAL HIGH (ref 11.5–15.5)
WBC: 9.1 10*3/uL (ref 4.0–10.5)
nRBC: 0.6 % — ABNORMAL HIGH (ref 0.0–0.2)

## 2021-11-17 LAB — COMPREHENSIVE METABOLIC PANEL
ALT: 15 U/L (ref 0–44)
AST: 21 U/L (ref 15–41)
Albumin: 3.1 g/dL — ABNORMAL LOW (ref 3.5–5.0)
Alkaline Phosphatase: 87 U/L (ref 38–126)
Anion gap: 12 (ref 5–15)
BUN: <5 mg/dL — ABNORMAL LOW (ref 6–20)
CO2: 20 mmol/L — ABNORMAL LOW (ref 22–32)
Calcium: 9.3 mg/dL (ref 8.9–10.3)
Chloride: 107 mmol/L (ref 98–111)
Creatinine, Ser: 0.65 mg/dL (ref 0.44–1.00)
GFR, Estimated: >60 mL/min (ref >60)
Glucose, Bld: 102 mg/dL — ABNORMAL HIGH (ref 70–99)
Potassium: 3.8 mmol/L (ref 3.5–5.1)
Sodium: 139 mmol/L (ref 135–145)
Total Bilirubin: 0.8 mg/dL (ref 0.3–1.2)
Total Protein: 6.5 g/dL (ref 6.5–8.1)

## 2021-11-17 LAB — DIC (DISSEMINATED INTRAVASCULAR COAGULATION)PANEL
D-Dimer, Quant: 1.1 ug/mL-FEU — ABNORMAL HIGH (ref 0.00–0.50)
Fibrinogen: 616 mg/dL — ABNORMAL HIGH (ref 210–475)
INR: 1 (ref 0.8–1.2)
Platelets: 248 10*3/uL (ref 150–400)
Prothrombin Time: 13.5 seconds (ref 11.4–15.2)
Smear Review: NONE SEEN
aPTT: 28 seconds (ref 24–36)

## 2021-11-17 LAB — WET PREP, GENITAL
Clue Cells Wet Prep HPF POC: NONE SEEN
Sperm: NONE SEEN
Trich, Wet Prep: NONE SEEN
WBC, Wet Prep HPF POC: <10 (ref <10)
Yeast Wet Prep HPF POC: NONE SEEN

## 2021-11-17 LAB — TYPE AND SCREEN
ABO/RH(D): A POS
Antibody Screen: NEGATIVE

## 2021-11-17 MED ORDER — BETAMETHASONE SOD PHOS & ACET 6 (3-3) MG/ML IJ SUSP
12.0000 mg | INTRAMUSCULAR | Status: DC
  Administered 2021-11-17: 12 mg via INTRAMUSCULAR
  Filled 2021-11-17: qty 5

## 2021-11-17 NOTE — MAU Note (Signed)
Report given to Maudie Mercury RN at [facility] atrium, pt to go to triage.  Notified Kiana at [facility] to arrange transport.  [facility] arrived on unit for transport.

## 2021-11-17 NOTE — H&P (Signed)
FACULTY PRACTICE ANTEPARTUM TRANSFER HISTORY AND PHYSICAL NOTE   History of Present Illness: Shirley Greene is a 26 y.o. at 44w3dby 6 wk [place] PMHx notable for two prior cesareans and currently pregnant with placenta previa and velamentous cord insertion, GDM, and anemia, admitted for vaginal bleeding.    Patient reports that around 0600 this morning she thought she felt some leaking fluid, went to the bathroom and when she wiped saw some brown, old looking blood. Subsequently had a few small dark red clots and came to hospital immediately as she has been instructed.   Has occasional contractions but none recently. Endorses normal fetal movement. Does feel like she's been leaking small amount of fluid for the past few days but no large gush.   Patient reports the fetal movement as active. Patient reports uterine contraction  activity as none. Patient reports  vaginal bleeding as scant staining. Patient describes fluid per vagina as None. Fetal presentation is cephalic and by ultrasound .  Patient Active Problem List   Diagnosis Date Noted   Anemia affecting pregnancy, antepartum 11/14/2021   Velamentous insertion of umbilical cord, antepartum 10/31/2021   Previous cesarean section complicating pregnancy 070/48/8891  Complete placenta previa with hemorrhage, third trimester 08/25/2021   GDM dx at 12wks 07/11/2021   Supervision of high risk pregnancy, antepartum 05/26/2021   ASCUS with positive high risk HPV cervical on 04/05/2021 04/10/2021   Alpha thalassemia silent carrier 05/27/2018    Past Medical History:  Diagnosis Date   Alpha thalassemia silent carrier 05/27/2018   Anemia    Cervical dysplasia 02/15/2017   02/2018: pap neg   Chlamydia    Infection    UTI   Sciatica 07/16/2018    Past Surgical History:  Procedure Laterality Date   CESAREAN SECTION N/A 04/05/2012   Procedure: CESAREAN SECTION;  Surgeon: JJonnie Kind MD;  [facility];  Service: Obstetrics;   Laterality: N/A;   CESAREAN SECTION WITH BILATERAL TUBAL LIGATION N/A 11/28/2018   Procedure: cesarean section;  Surgeon: EChancy Milroy MD;  [facility];  Service: Obstetrics;  Laterality: N/A;   DILATION AND EVACUATION N/A 11/19/2020   Procedure: DILATATION AND EVACUATION;  Surgeon: OJanyth Pupa DO;  [facility]  Service: Gynecology;  Laterality: N/A;   OPERATIVE ULTRASOUND N/A 11/19/2020   Procedure: OPERATIVE ULTRASOUND;  Surgeon: OJanyth Pupa DO;  [facility]  Service: Gynecology;  Laterality: N/A;    OB History  Gravida Para Term Preterm AB Living  _0 SAB IAB Ectopic Multiple Live Births    1   0 2    # Outcome Date GA Lbr Len/2nd Weight Sex Delivery Anes PTL Lv  4 Current           3 IAB 11/2020          2 Term 11/28/18 [id number] g M CS-LTranv Spinal  LIV  1 Term 04/05/12 [id number] g M CS-LTranv EPI  LIV    Obstetric Comments  "OP" presentation, got stuck at 9cmElbastory   Socioeconomic History   Marital status: Single    Spouse name: Not on file   Number of children: Not on file   Years of education: Not on file   Highest education level: Not on file  Occupational History   Not on file  Tobacco Use   Smoking status: Never   Smokeless tobacco: Never  Vaping  Use   Vaping Use: Never used  Substance and Sexual Activity   Alcohol use: Not Currently    Comment: twice a month   Drug use: No   Sexual activity: Not Currently    Birth control/protection: None  Other Topics Concern   Not on file  Social History Narrative   Not on file   Social Determinants of Health   Financial Resource Strain: Not on file  Food Insecurity: No Food Insecurity (07/27/2021)   Hunger Vital Sign    Worried About Running Out of Food in the Last Year: Never true    Ran Out of Food in the Last Year: Never true  Transportation Needs: Not on file  Physical Activity: Not on file  Stress: Not on file  Social Connections: Not on file     Family History  Problem Relation Age of Onset   Hypertension Mother    Hypertension Father    Cancer Maternal Grandfather     No Known Allergies  Medications Prior to Admission  Medication Sig Dispense Refill Last Dose   Accu-Chek Softclix Lancets lancets 1 each by Other route 4 (four) times daily. 100 each 12 11/16/2021   Blood Glucose Monitoring Suppl (ACCU-CHEK GUIDE) w/Device KIT 1 Device by Does not apply route 4 (four) times daily. 1 kit 0 11/16/2021   glucose blood (ACCU-CHEK GUIDE) test strip Use to check blood sugars four times a day was instructed 50 each 12 11/16/2021   Continuous Blood Gluc Sensor (FREESTYLE LIBRE 3 SENSOR) MISC 1 Package by Does not apply route as directed. Place 1 sensor on the skin every 14 days. Use to check glucose continuously 2 each 3    Continuous Blood Gluc Transmit (DEXCOM G6 TRANSMITTER) MISC 1 Package by Does not apply route every 3 (three) months. 1 each 2     Review of Systems  Pertinent positives and negative per HPI, all others reviewed and negative   Vitals:  BP (!) 99/54   Pulse 95   Temp 98.4 F (36.9 C) (Oral)   Resp 19   Ht 5' (1.524 m)   Wt 77.9 kg   LMP  (LMP Unknown)   SpO2 100%   BMI 33.55 kg/m   Physical Exam Vitals reviewed.  Constitutional:      General: She is not in acute distress.    Appearance: Normal appearance. She is not ill-appearing or diaphoretic.  Eyes:     General: No scleral icterus. Cardiovascular:     Rate and Rhythm: Normal rate.  Pulmonary:     Effort: Pulmonary effort is normal. No respiratory distress.  Abdominal:     Comments: gravid  Genitourinary:    General: Normal vulva.     Comments: Small amount of old brown blood in the vault. No active bleeding appreciated. Cervix visually closed and long. No pooling.  Neurological:     Mental Status: She is alert.      Cervix: Evaluated by sterile speculum exam. and found to be visually closed/ Long/ --  and fetal presentation is  unsure. Membranes:sterile speculum exam without pooling Fetal Monitoring:Baseline: 150 bpm, Variability: Good {> 6 bpm), Accelerations: Reactive, and Decelerations: Absent Tocometer: Noisy but no obvious contractions  Labs:  Results for orders placed or performed during the hospital encounter of 11/17/21 (from the past 24 hour(s))  Wet prep, genital   Collection Time: 11/17/21  8:30 AM  Result Value Ref Range   Yeast Wet Prep HPF POC NONE SEEN NONE SEEN   Trich,  Wet Prep NONE SEEN NONE SEEN   Clue Cells Wet Prep HPF POC NONE SEEN NONE SEEN   WBC, Wet Prep HPF POC <10 <10   Sperm NONE SEEN     Imaging Studies: [place] MFM OB FOLLOW UP  Result Date: 10/24/2021 ----------------------------------------------------------------------  OBSTETRICS REPORT                       (Signed Final 10/24/2021 05:05 pm) ---------------------------------------------------------------------- Patient Info  ID #:       [id number]                          D.O.B.:  09/17/1995 (26 yrs)  Name:       Lurline Hare                Visit Date: 10/24/2021 03:57 pm ---------------------------------------------------------------------- Performed By  Attending:        Johnell Comings MD         Ref. Address:     [place]  Performed By:     Germain Osgood            [facility]                                                             [place] [facility]                                                             [facility]  Referred By:      [facility] ---------------------------------------------------------------------- Orders  #  Description                           Code        Ordered By  1  [place] MFM OB FOLLOW UP                   [id number]    Peterson Ao ----------------------------------------------------------------------  #  Order #                     Accession #                 Episode #  1  [id number]  [id number]                 [id number] ---------------------------------------------------------------------- Indications  Obesity complicating pregnancy, second         O99.212  trimester  Gestational diabetes in pregnancy, diet        O24.410  controlled  Genetic carrier (alpha thal silent)            Z14.8  [redacted] weeks gestation of pregnancy                Z3A.28  NIPS LR female ---------------------------------------------------------------------- Fetal Evaluation  Num Of Fetuses:         1  Fetal Heart Rate(bpm):  145  Cardiac Activity:       Observed  Presentation:           Cephalic  Placenta:               Central previa  P. Cord Insertion:      Velamentous insertion  Amniotic Fluid  AFI FV:      Within normal limits  AFI Sum(cm)     %Tile       Largest Pocket(cm)  13.7            42          7.3  RUQ(cm)       RLQ(cm)       LUQ(cm)        LLQ(cm)  7.3           0.6           5.8            0 ---------------------------------------------------------------------- Biometry  BPD:      68.6  mm     G. Age:  27w 4d         25  %    CI:        72.24   %    70 - 86                                                          FL/HC:      19.0   %    18.8 - 20.6  HC:      256.8  mm     G. Age:  27w 6d         18  %    HC/AC:      1.06        1.05 - 1.21  AC:      241.3  mm     G. Age:  28w 3d         55  %    FL/BPD:     71.1   %    71 - 87  FL:       48.8  mm     G. Age:  26w 3d        4.7  %    FL/AC:      20.2   %    20 - 24  LV:        2.6  mm  Est. FW:    1103  gm      2 lb 7 oz     24  % ---------------------------------------------------------------------- OB History  Gravidity:  4         Term:   2        Prem:   0        SAB:   0  TOP:          1       Ectopic:  0        Living: 2 ---------------------------------------------------------------------- Gestational Age  LMP:           32w 0d        Date:  03/14/21                  EDD:   12/19/21  U/S Today:     27w 4d                                         EDD:   01/19/22  Best:          28w 0d     Det. By:  U/S  (08/22/21)          EDD:   01/16/22 ---------------------------------------------------------------------- Anatomy  Cranium:               Appears normal         Aortic Arch:            Previously seen  Cavum:                 Appears normal         Ductal Arch:            Previously seen  Ventricles:            Appears normal         Diaphragm:              Appears normal  Choroid Plexus:        Previously seen        Stomach:                Appears normal, left                                                                        sided  Cerebellum:            Previously seen        Abdomen:                Appears normal  Posterior Fossa:       Previously seen        Abdominal Wall:         Previously seen  Nuchal Fold:           Previously seen        Cord Vessels:           Previously seen  Face:                  Orbits and profile     Kidneys:                Appear normal  previously seen  Lips:                  Previously seen        Bladder:                Appears normal  Thoracic:              Previously seen        Spine:                  Previously seen  Heart:                 Appears normal         Upper Extremities:      Previously seen                         (4CH, axis, and                         situs)  RVOT:                  Previously seen        Lower Extremities:      Previously seen  LVOT:                  Appears normal  Other:  Fetus appears to be a female. Heels/feet and open hands/5th digits          prev visualized. Nasal bone, lenses, maxilla, mandible and falx prev          visualized  3VV and 3VTV visualized. ---------------------------------------------------------------------- Cervix Uterus Adnexa  Cervix  Not visualized (advanced GA >24wks)  Uterus  No abnormality visualized.  Right Ovary  Not visualized.  Left Ovary  Not visualized.  Cul De Sac  No free fluid seen.  Adnexa  No  adnexal mass visualized. ---------------------------------------------------------------------- Comments  This patient was seen for a follow up exam due to a complete  placenta previa and recently diagnosed diet-controlled  gestational diabetes.  She has a history of 2 prior C-sections.  She was informed that the fetal growth and amniotic fluid  level appears appropriate for her gestational age.  A complete placenta previa continues to be noted on today's  exam.  There were no sonographic signs of placenta accreta  noted on today's exam.  Due to gestational diabetes, a follow-up growth scan was  scheduled in 4 weeks.  Weekly fetal testing starting at 32 weeks is recommended  should she require insulin or metformin for treatment of  gestational diabetes.  We will perform a transvaginal ultrasound in 6 weeks when  she will be at 34 weeks to verify that a placenta previa is  present.  Should a complete placenta previa continue to be noted, a  cesarean delivery should be scheduled at between 36 to 37  weeks. ----------------------------------------------------------------------                   Johnell Comings, MD Electronically Signed Final Report   10/24/2021 05:05 pm ----------------------------------------------------------------------    Assessment and Plan: Patient Active Problem List   Diagnosis Date Noted   Anemia affecting pregnancy, antepartum 11/14/2021   Velamentous insertion of umbilical cord, antepartum 10/31/2021   Previous cesarean section complicating pregnancy 01/75/1025   Complete placenta previa with hemorrhage, third trimester 08/25/2021   GDM dx at 12wks 07/11/2021   Supervision of high risk pregnancy, antepartum  05/26/2021   ASCUS with positive high risk HPV cervical on 04/05/2021 04/10/2021   Alpha thalassemia silent carrier 05/27/2018   Patient with vaginal bleeding in setting of known placenta previa and velamentous cord insertion, recommended admission for prolonged observation which  she is in agreement with. Discussed with NICU, unfortunately there are no beds at present and transfer is recommended.  Discussed with MFM fellow Dr. Anda Kraft at [facility], she accepts the transfer.  [facility] notified to transport the patient.  Patient given betamethasone x 2 doses, will need close monitoring of her glucose given GDM.  Repeat growth [place] and TVUS ordered and are in process. History of two prior cesareans but thankfully no evidence of PAS on [place] to date.  No evidence of pooling and fern slide negative, but patient does report persistent leaking of fluid for past few days. Consider amnisure, especially if AFI is concerning. Comfortable appearing and without contractions, defer magnesium CP prophylaxis at this time.     Clarnce Flock, MD/MPH Attending Family Medicine Physician, [facility] for [facility], [place]

## 2021-11-17 NOTE — MAU Note (Signed)
Shirley Greene is a 26 y.o. at [redacted]w[redacted]d here in MAU reporting: has placenta previa and had VB with wiping this morning @ 0600.  States VB was dark red/brown.  Also reports having abdominal cramping.  Reports unsure if LOF, had small amt clear fluid leak followed by small blood clots.  Endorses +FM.  Denies intercourse. LMP: NA Onset of complaint: today  Pain score: 6 There were no vitals filed for this visit.   FHT:158 bpm Lab orders placed from triage:   None

## 2021-11-18 DIAGNOSIS — O24410 Gestational diabetes mellitus in pregnancy, diet controlled: Secondary | ICD-10-CM | POA: Diagnosis not present

## 2021-11-18 DIAGNOSIS — O99013 Anemia complicating pregnancy, third trimester: Secondary | ICD-10-CM | POA: Diagnosis not present

## 2021-11-18 DIAGNOSIS — O4413 Placenta previa with hemorrhage, third trimester: Secondary | ICD-10-CM | POA: Diagnosis not present

## 2021-11-18 DIAGNOSIS — O99213 Obesity complicating pregnancy, third trimester: Secondary | ICD-10-CM | POA: Diagnosis not present

## 2021-11-18 DIAGNOSIS — Z3A31 31 weeks gestation of pregnancy: Secondary | ICD-10-CM | POA: Diagnosis not present

## 2021-11-18 DIAGNOSIS — O43123 Velamentous insertion of umbilical cord, third trimester: Secondary | ICD-10-CM | POA: Diagnosis not present

## 2021-11-18 DIAGNOSIS — O34219 Maternal care for unspecified type scar from previous cesarean delivery: Secondary | ICD-10-CM | POA: Diagnosis not present

## 2021-11-18 DIAGNOSIS — D509 Iron deficiency anemia, unspecified: Secondary | ICD-10-CM | POA: Diagnosis not present

## 2021-11-18 LAB — GC/CHLAMYDIA PROBE AMP (~~LOC~~) NOT AT ARMC
Chlamydia: NEGATIVE
Comment: NEGATIVE
Comment: NORMAL
Neisseria Gonorrhea: NEGATIVE

## 2021-11-19 DIAGNOSIS — Z3A31 31 weeks gestation of pregnancy: Secondary | ICD-10-CM | POA: Diagnosis not present

## 2021-11-19 DIAGNOSIS — O4413 Placenta previa with hemorrhage, third trimester: Secondary | ICD-10-CM | POA: Diagnosis not present

## 2021-11-19 DIAGNOSIS — D509 Iron deficiency anemia, unspecified: Secondary | ICD-10-CM | POA: Diagnosis not present

## 2021-11-19 DIAGNOSIS — O24410 Gestational diabetes mellitus in pregnancy, diet controlled: Secondary | ICD-10-CM | POA: Diagnosis not present

## 2021-11-19 DIAGNOSIS — O99013 Anemia complicating pregnancy, third trimester: Secondary | ICD-10-CM | POA: Diagnosis not present

## 2021-11-19 DIAGNOSIS — O34219 Maternal care for unspecified type scar from previous cesarean delivery: Secondary | ICD-10-CM | POA: Diagnosis not present

## 2021-11-20 DIAGNOSIS — O99013 Anemia complicating pregnancy, third trimester: Secondary | ICD-10-CM | POA: Diagnosis not present

## 2021-11-20 DIAGNOSIS — D509 Iron deficiency anemia, unspecified: Secondary | ICD-10-CM | POA: Diagnosis not present

## 2021-11-20 DIAGNOSIS — Z3A31 31 weeks gestation of pregnancy: Secondary | ICD-10-CM | POA: Diagnosis not present

## 2021-11-20 DIAGNOSIS — O4413 Placenta previa with hemorrhage, third trimester: Secondary | ICD-10-CM | POA: Diagnosis not present

## 2021-11-20 DIAGNOSIS — O34219 Maternal care for unspecified type scar from previous cesarean delivery: Secondary | ICD-10-CM | POA: Diagnosis not present

## 2021-11-20 DIAGNOSIS — O24410 Gestational diabetes mellitus in pregnancy, diet controlled: Secondary | ICD-10-CM | POA: Diagnosis not present

## 2021-11-21 ENCOUNTER — Encounter: Payer: Self-pay | Admitting: Obstetrics

## 2021-11-21 ENCOUNTER — Telehealth: Payer: Self-pay | Admitting: Obstetrics and Gynecology

## 2021-11-21 DIAGNOSIS — O24414 Gestational diabetes mellitus in pregnancy, insulin controlled: Secondary | ICD-10-CM | POA: Diagnosis not present

## 2021-11-21 DIAGNOSIS — Z3A31 31 weeks gestation of pregnancy: Secondary | ICD-10-CM | POA: Diagnosis not present

## 2021-11-21 DIAGNOSIS — K5900 Constipation, unspecified: Secondary | ICD-10-CM | POA: Diagnosis not present

## 2021-11-21 DIAGNOSIS — O99013 Anemia complicating pregnancy, third trimester: Secondary | ICD-10-CM | POA: Diagnosis not present

## 2021-11-21 DIAGNOSIS — O43193 Other malformation of placenta, third trimester: Secondary | ICD-10-CM | POA: Diagnosis not present

## 2021-11-21 DIAGNOSIS — O99613 Diseases of the digestive system complicating pregnancy, third trimester: Secondary | ICD-10-CM | POA: Diagnosis not present

## 2021-11-21 DIAGNOSIS — O4413 Placenta previa with hemorrhage, third trimester: Secondary | ICD-10-CM | POA: Diagnosis not present

## 2021-11-21 DIAGNOSIS — O34211 Maternal care for low transverse scar from previous cesarean delivery: Secondary | ICD-10-CM | POA: Diagnosis not present

## 2021-11-21 DIAGNOSIS — D509 Iron deficiency anemia, unspecified: Secondary | ICD-10-CM | POA: Diagnosis not present

## 2021-11-21 NOTE — Telephone Encounter (Signed)
Faxed TBL consent to transferred hospital with success

## 2021-11-22 ENCOUNTER — Ambulatory Visit: Payer: Medicaid Other

## 2021-11-22 DIAGNOSIS — O34211 Maternal care for low transverse scar from previous cesarean delivery: Secondary | ICD-10-CM | POA: Diagnosis not present

## 2021-11-22 DIAGNOSIS — O99013 Anemia complicating pregnancy, third trimester: Secondary | ICD-10-CM | POA: Diagnosis not present

## 2021-11-22 DIAGNOSIS — K5900 Constipation, unspecified: Secondary | ICD-10-CM | POA: Diagnosis not present

## 2021-11-22 DIAGNOSIS — Z3A31 31 weeks gestation of pregnancy: Secondary | ICD-10-CM | POA: Diagnosis not present

## 2021-11-22 DIAGNOSIS — D509 Iron deficiency anemia, unspecified: Secondary | ICD-10-CM | POA: Diagnosis not present

## 2021-11-22 DIAGNOSIS — O24414 Gestational diabetes mellitus in pregnancy, insulin controlled: Secondary | ICD-10-CM | POA: Diagnosis not present

## 2021-11-22 DIAGNOSIS — O99613 Diseases of the digestive system complicating pregnancy, third trimester: Secondary | ICD-10-CM | POA: Diagnosis not present

## 2021-11-22 DIAGNOSIS — O4413 Placenta previa with hemorrhage, third trimester: Secondary | ICD-10-CM | POA: Diagnosis not present

## 2021-11-22 DIAGNOSIS — O43193 Other malformation of placenta, third trimester: Secondary | ICD-10-CM | POA: Diagnosis not present

## 2021-11-23 ENCOUNTER — Encounter: Payer: Medicaid Other | Admitting: Family Medicine

## 2021-11-23 ENCOUNTER — Encounter (HOSPITAL_COMMUNITY): Payer: Medicaid Other

## 2021-11-23 DIAGNOSIS — K5900 Constipation, unspecified: Secondary | ICD-10-CM | POA: Diagnosis not present

## 2021-11-23 DIAGNOSIS — O99013 Anemia complicating pregnancy, third trimester: Secondary | ICD-10-CM | POA: Diagnosis not present

## 2021-11-23 DIAGNOSIS — O99613 Diseases of the digestive system complicating pregnancy, third trimester: Secondary | ICD-10-CM | POA: Diagnosis not present

## 2021-11-23 DIAGNOSIS — O4413 Placenta previa with hemorrhage, third trimester: Secondary | ICD-10-CM | POA: Diagnosis not present

## 2021-11-23 DIAGNOSIS — Z3A31 31 weeks gestation of pregnancy: Secondary | ICD-10-CM | POA: Diagnosis not present

## 2021-11-23 DIAGNOSIS — D509 Iron deficiency anemia, unspecified: Secondary | ICD-10-CM | POA: Diagnosis not present

## 2021-11-23 DIAGNOSIS — O24414 Gestational diabetes mellitus in pregnancy, insulin controlled: Secondary | ICD-10-CM | POA: Diagnosis not present

## 2021-11-23 DIAGNOSIS — O43193 Other malformation of placenta, third trimester: Secondary | ICD-10-CM | POA: Diagnosis not present

## 2021-11-23 DIAGNOSIS — O34211 Maternal care for low transverse scar from previous cesarean delivery: Secondary | ICD-10-CM | POA: Diagnosis not present

## 2021-11-24 DIAGNOSIS — O99613 Diseases of the digestive system complicating pregnancy, third trimester: Secondary | ICD-10-CM | POA: Diagnosis not present

## 2021-11-24 DIAGNOSIS — O24410 Gestational diabetes mellitus in pregnancy, diet controlled: Secondary | ICD-10-CM | POA: Diagnosis not present

## 2021-11-24 DIAGNOSIS — Z3A31 31 weeks gestation of pregnancy: Secondary | ICD-10-CM | POA: Diagnosis not present

## 2021-11-24 DIAGNOSIS — Z3A32 32 weeks gestation of pregnancy: Secondary | ICD-10-CM | POA: Diagnosis not present

## 2021-11-24 DIAGNOSIS — O24414 Gestational diabetes mellitus in pregnancy, insulin controlled: Secondary | ICD-10-CM | POA: Diagnosis not present

## 2021-11-24 DIAGNOSIS — D509 Iron deficiency anemia, unspecified: Secondary | ICD-10-CM | POA: Diagnosis not present

## 2021-11-24 DIAGNOSIS — O99213 Obesity complicating pregnancy, third trimester: Secondary | ICD-10-CM | POA: Diagnosis not present

## 2021-11-24 DIAGNOSIS — O4403 Placenta previa specified as without hemorrhage, third trimester: Secondary | ICD-10-CM | POA: Diagnosis not present

## 2021-11-24 DIAGNOSIS — O99013 Anemia complicating pregnancy, third trimester: Secondary | ICD-10-CM | POA: Diagnosis not present

## 2021-11-24 DIAGNOSIS — O43213 Placenta accreta, third trimester: Secondary | ICD-10-CM | POA: Diagnosis not present

## 2021-11-24 DIAGNOSIS — O43193 Other malformation of placenta, third trimester: Secondary | ICD-10-CM | POA: Diagnosis not present

## 2021-11-24 DIAGNOSIS — O4413 Placenta previa with hemorrhage, third trimester: Secondary | ICD-10-CM | POA: Diagnosis not present

## 2021-11-24 DIAGNOSIS — K5900 Constipation, unspecified: Secondary | ICD-10-CM | POA: Diagnosis not present

## 2021-11-24 DIAGNOSIS — O34211 Maternal care for low transverse scar from previous cesarean delivery: Secondary | ICD-10-CM | POA: Diagnosis not present

## 2021-11-25 DIAGNOSIS — O43193 Other malformation of placenta, third trimester: Secondary | ICD-10-CM | POA: Diagnosis not present

## 2021-11-25 DIAGNOSIS — O24414 Gestational diabetes mellitus in pregnancy, insulin controlled: Secondary | ICD-10-CM | POA: Diagnosis not present

## 2021-11-25 DIAGNOSIS — O34211 Maternal care for low transverse scar from previous cesarean delivery: Secondary | ICD-10-CM | POA: Diagnosis not present

## 2021-11-25 DIAGNOSIS — O4413 Placenta previa with hemorrhage, third trimester: Secondary | ICD-10-CM | POA: Diagnosis not present

## 2021-11-25 DIAGNOSIS — K5900 Constipation, unspecified: Secondary | ICD-10-CM | POA: Diagnosis not present

## 2021-11-25 DIAGNOSIS — O99613 Diseases of the digestive system complicating pregnancy, third trimester: Secondary | ICD-10-CM | POA: Diagnosis not present

## 2021-11-25 DIAGNOSIS — D509 Iron deficiency anemia, unspecified: Secondary | ICD-10-CM | POA: Diagnosis not present

## 2021-11-25 DIAGNOSIS — O99013 Anemia complicating pregnancy, third trimester: Secondary | ICD-10-CM | POA: Diagnosis not present

## 2021-11-25 DIAGNOSIS — Z3A31 31 weeks gestation of pregnancy: Secondary | ICD-10-CM | POA: Diagnosis not present

## 2021-11-26 DIAGNOSIS — Z3A31 31 weeks gestation of pregnancy: Secondary | ICD-10-CM | POA: Diagnosis not present

## 2021-11-26 DIAGNOSIS — O43193 Other malformation of placenta, third trimester: Secondary | ICD-10-CM | POA: Diagnosis not present

## 2021-11-26 DIAGNOSIS — K5900 Constipation, unspecified: Secondary | ICD-10-CM | POA: Diagnosis not present

## 2021-11-26 DIAGNOSIS — O24414 Gestational diabetes mellitus in pregnancy, insulin controlled: Secondary | ICD-10-CM | POA: Diagnosis not present

## 2021-11-26 DIAGNOSIS — D509 Iron deficiency anemia, unspecified: Secondary | ICD-10-CM | POA: Diagnosis not present

## 2021-11-26 DIAGNOSIS — O4413 Placenta previa with hemorrhage, third trimester: Secondary | ICD-10-CM | POA: Diagnosis not present

## 2021-11-26 DIAGNOSIS — O99013 Anemia complicating pregnancy, third trimester: Secondary | ICD-10-CM | POA: Diagnosis not present

## 2021-11-26 DIAGNOSIS — O99613 Diseases of the digestive system complicating pregnancy, third trimester: Secondary | ICD-10-CM | POA: Diagnosis not present

## 2021-11-26 DIAGNOSIS — O34211 Maternal care for low transverse scar from previous cesarean delivery: Secondary | ICD-10-CM | POA: Diagnosis not present

## 2021-11-27 DIAGNOSIS — O99613 Diseases of the digestive system complicating pregnancy, third trimester: Secondary | ICD-10-CM | POA: Diagnosis not present

## 2021-11-27 DIAGNOSIS — K5900 Constipation, unspecified: Secondary | ICD-10-CM | POA: Diagnosis not present

## 2021-11-27 DIAGNOSIS — Z3A31 31 weeks gestation of pregnancy: Secondary | ICD-10-CM | POA: Diagnosis not present

## 2021-11-27 DIAGNOSIS — D509 Iron deficiency anemia, unspecified: Secondary | ICD-10-CM | POA: Diagnosis not present

## 2021-11-27 DIAGNOSIS — O43193 Other malformation of placenta, third trimester: Secondary | ICD-10-CM | POA: Diagnosis not present

## 2021-11-27 DIAGNOSIS — O24414 Gestational diabetes mellitus in pregnancy, insulin controlled: Secondary | ICD-10-CM | POA: Diagnosis not present

## 2021-11-27 DIAGNOSIS — O34211 Maternal care for low transverse scar from previous cesarean delivery: Secondary | ICD-10-CM | POA: Diagnosis not present

## 2021-11-27 DIAGNOSIS — O4413 Placenta previa with hemorrhage, third trimester: Secondary | ICD-10-CM | POA: Diagnosis not present

## 2021-11-27 DIAGNOSIS — O99013 Anemia complicating pregnancy, third trimester: Secondary | ICD-10-CM | POA: Diagnosis not present

## 2021-11-28 ENCOUNTER — Encounter: Payer: Medicaid Other | Admitting: Family Medicine

## 2021-11-29 DIAGNOSIS — O9902 Anemia complicating childbirth: Secondary | ICD-10-CM | POA: Diagnosis not present

## 2021-11-29 DIAGNOSIS — O321XX0 Maternal care for breech presentation, not applicable or unspecified: Secondary | ICD-10-CM | POA: Diagnosis not present

## 2021-11-29 DIAGNOSIS — O34211 Maternal care for low transverse scar from previous cesarean delivery: Secondary | ICD-10-CM | POA: Diagnosis not present

## 2021-11-29 DIAGNOSIS — O43893 Other placental disorders, third trimester: Secondary | ICD-10-CM | POA: Diagnosis not present

## 2021-11-29 DIAGNOSIS — D509 Iron deficiency anemia, unspecified: Secondary | ICD-10-CM | POA: Diagnosis not present

## 2021-11-29 DIAGNOSIS — Z302 Encounter for sterilization: Secondary | ICD-10-CM | POA: Diagnosis not present

## 2021-11-29 DIAGNOSIS — Z3A33 33 weeks gestation of pregnancy: Secondary | ICD-10-CM | POA: Diagnosis not present

## 2021-11-29 DIAGNOSIS — O4401 Placenta previa specified as without hemorrhage, first trimester: Secondary | ICD-10-CM | POA: Diagnosis not present

## 2021-11-29 DIAGNOSIS — O24429 Gestational diabetes mellitus in childbirth, unspecified control: Secondary | ICD-10-CM | POA: Diagnosis not present

## 2021-11-29 DIAGNOSIS — O43213 Placenta accreta, third trimester: Secondary | ICD-10-CM | POA: Diagnosis not present

## 2021-11-29 DIAGNOSIS — O4403 Placenta previa specified as without hemorrhage, third trimester: Secondary | ICD-10-CM | POA: Diagnosis not present

## 2021-12-02 DIAGNOSIS — O9903 Anemia complicating the puerperium: Secondary | ICD-10-CM | POA: Diagnosis not present

## 2021-12-02 DIAGNOSIS — D509 Iron deficiency anemia, unspecified: Secondary | ICD-10-CM | POA: Diagnosis not present

## 2021-12-05 ENCOUNTER — Ambulatory Visit: Payer: Medicaid Other

## 2021-12-05 DIAGNOSIS — O900 Disruption of cesarean delivery wound: Secondary | ICD-10-CM | POA: Diagnosis not present

## 2021-12-05 DIAGNOSIS — L24A9 Irritant contact dermatitis due friction or contact with other specified body fluids: Secondary | ICD-10-CM | POA: Diagnosis not present

## 2021-12-05 DIAGNOSIS — O8601 Infection of obstetric surgical wound, superficial incisional site: Secondary | ICD-10-CM | POA: Diagnosis not present

## 2021-12-12 ENCOUNTER — Encounter: Payer: Medicaid Other | Admitting: Obstetrics & Gynecology

## 2021-12-12 NOTE — Patient Instructions (Signed)
Shirley Greene  12/12/2021   Your procedure is scheduled on:  12/22/2021  Arrive at 1030 at Entrance C on [address] at [address] [facility]  and [employer]. You are invited to use the FREE valet parking or use the Visitor's parking deck.  Pick up the phone at the desk and dial [phone].  Call this number if you have problems the morning of surgery: [phone]  Remember:   Do not eat food:(After Midnight) Desps de medianoche.  Do not drink clear liquids: (After Midnight) Desps de medianoche.  Take these medicines the morning of surgery with A SIP OF WATER:  none   Do not wear jewelry, make-up or nail polish.  Do not wear lotions, powders, or perfumes. Do not wear deodorant.  Do not shave 48 hours prior to surgery.  Do not bring valuables to the hospital.  [facility] is not   responsible for any belongings or valuables brought to the hospital.  Contacts, dentures or bridgework may not be worn into surgery.  Leave suitcase in the car. After surgery it may be brought to your room.  For patients admitted to the hospital, checkout time is 11:00 AM the day of              discharge.      Please read over the following fact sheets that you were given:     Preparing for Surgery

## 2021-12-14 ENCOUNTER — Ambulatory Visit (INDEPENDENT_AMBULATORY_CARE_PROVIDER_SITE_OTHER): Payer: Medicaid Other | Admitting: Family Medicine

## 2021-12-14 VITALS — BP 108/74 | HR 97

## 2021-12-14 DIAGNOSIS — L929 Granulomatous disorder of the skin and subcutaneous tissue, unspecified: Secondary | ICD-10-CM | POA: Diagnosis not present

## 2021-12-14 NOTE — Progress Notes (Signed)
   Subjective:    Patient ID: Shirley Greene is a 26 y.o. female presenting with No chief complaint on file.  on 12/14/2021  HPI: Patent is 2 weeks out from [employer] due to acreta.  Review of Systems  Constitutional:  Negative for chills and fever.  HENT:  Negative for congestion, nosebleeds and rhinorrhea.   Eyes:  Negative for visual disturbance.  Respiratory:  Negative for chest tightness and shortness of breath.   Cardiovascular:  Negative for chest pain.  Gastrointestinal:  Negative for abdominal distention, abdominal pain, blood in stool, constipation, diarrhea, nausea and vomiting.  Genitourinary:  Negative for dysuria, frequency, menstrual problem and vaginal bleeding.  Musculoskeletal:  Negative for arthralgias.  Skin:  Negative for rash.  Neurological:  Negative for dizziness and headaches.  Psychiatric/Behavioral:  Negative for behavioral problems, dysphoric mood and sleep disturbance.   All other systems reviewed and are negative.     Objective:    Breastfeeding Unknown  Physical Exam Exam conducted with a chaperone present.  Constitutional:      General: She is not in acute distress.    Appearance: She is well-developed.  HENT:     Head: Normocephalic and atraumatic.  Eyes:     General: No scleral icterus. Cardiovascular:     Rate and Rhythm: Normal rate.  Pulmonary:     Effort: Pulmonary effort is normal.  Abdominal:     Palpations: Abdomen is soft.  Musculoskeletal:     Cervical back: Neck supple.  Skin:    General: Skin is warm and dry.     Comments: Area of granulation tissue at inferior edge--treated with AgNO3. Serous fluid came out of inicision. No erythema or calor  Neurological:     Mental Status: She is alert and oriented to person, place, and time.         Assessment & Plan:  Granulation tissue - treated, repeat in 1 week if needed.   Return in about 1 week (around 12/21/2021) for a follow-up.  Reva Bores,  MD 12/14/2021 9:38 AM

## 2021-12-19 ENCOUNTER — Encounter: Payer: Medicaid Other | Admitting: Family Medicine

## 2021-12-20 ENCOUNTER — Other Ambulatory Visit (HOSPITAL_COMMUNITY)
Admission: RE | Admit: 2021-12-20 | Discharge: 2021-12-20 | Disposition: A | Discharge status: Home / Self Care | Payer: Medicaid Other | Source: Ambulatory Visit | Attending: Advanced Practice Midwife | Admitting: Advanced Practice Midwife

## 2021-12-21 ENCOUNTER — Ambulatory Visit (INDEPENDENT_AMBULATORY_CARE_PROVIDER_SITE_OTHER): Payer: Medicaid Other | Admitting: Family Medicine

## 2021-12-21 ENCOUNTER — Encounter: Payer: Self-pay | Admitting: Family Medicine

## 2021-12-21 VITALS — BP 107/72 | HR 82 | Wt 160.0 lb

## 2021-12-21 DIAGNOSIS — L929 Granulomatous disorder of the skin and subcutaneous tissue, unspecified: Secondary | ICD-10-CM | POA: Diagnosis not present

## 2021-12-21 NOTE — Progress Notes (Signed)
   Subjective:    Patient ID: Shirley Greene is a 26 y.o. 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 Has poor healing at inferior edge. Treated last week with AgNO3. Less drainage, but overall looks the same.  Review of Systems  Constitutional:  Negative for chills and fever.  Respiratory:  Negative for shortness of breath.   Cardiovascular:  Negative for chest pain.  Gastrointestinal:  Negative for abdominal pain, nausea and vomiting.  Genitourinary:  Negative for dysuria.  Skin:  Negative for rash.      Objective:    BP 107/72   Pulse 82   Wt 160 lb (72.6 kg)   BMI 31.25 kg/m  Physical Exam Exam conducted with a chaperone present.  Constitutional:      General: She is not in acute distress.    Appearance: She is well-developed.  HENT:     Head: Normocephalic and atraumatic.  Eyes:     General: No scleral icterus. Cardiovascular:     Rate and Rhythm: Normal rate.  Pulmonary:     Effort: Pulmonary effort is normal.  Abdominal:     Palpations: Abdomen is soft.  Musculoskeletal:     Cervical back: Neck supple.  Skin:    General: Skin is warm and dry.     Comments: Small area of granulation tissue at inferior edge. No erythema, no calor   Neurological:     Mental Status: She is alert and oriented to person, place, and time.           Assessment & Plan:    Granulation tissue - treated wit AgNO3 x 2 today  Return in about 1 week (around 12/28/2021) for a follow-up.  Reva Bores, MD 12/21/2021 3:49 PM

## 2021-12-21 NOTE — Progress Notes (Signed)
Patient here for incision check  States incision area is the same as last last week now noticing blood.   Pt denies any fever or chills.

## 2021-12-22 ENCOUNTER — Encounter (HOSPITAL_COMMUNITY): Payer: Self-pay

## 2021-12-22 ENCOUNTER — Inpatient Hospital Stay (HOSPITAL_COMMUNITY): Admit: 2021-12-22 | Payer: Medicaid Other | Admitting: Obstetrics and Gynecology

## 2021-12-22 SURGERY — Surgical Case
Anesthesia: Regional | Laterality: Bilateral

## 2021-12-29 ENCOUNTER — Ambulatory Visit (INDEPENDENT_AMBULATORY_CARE_PROVIDER_SITE_OTHER): Payer: Medicaid Other | Admitting: Advanced Practice Midwife

## 2021-12-29 VITALS — BP 102/67 | HR 87 | Wt 162.0 lb

## 2021-12-29 DIAGNOSIS — O900 Disruption of cesarean delivery wound: Secondary | ICD-10-CM | POA: Diagnosis not present

## 2021-12-29 DIAGNOSIS — Z789 Other specified health status: Secondary | ICD-10-CM

## 2021-12-29 NOTE — Progress Notes (Signed)
Pt here to F/U on incision from C-Section  Pt denies any bleeding today.

## 2021-12-31 NOTE — Progress Notes (Signed)
    Subjective:      Subjective  Patient ID: Shirley Greene is a 26 y.o. female presenting for incision check. She is four weeks s/p supracervical C-hyst 2/2 placenta accreta  Patient presents for wound check. Incision is clean,dry and well-approximated. Small area of open skin, per RN and patient improved from previous. Cleaned with Silver Nitrate.    Patient education on incision care includes keeping wound clean and dry, using clean towels, looking at incision each day to monitor for purulent drainage.   Follow up in one week. Patient to call clinic or seek care at closest ED for new onset fever, purulent drainage, bleeding, or signs of openings in incision. Patient verbalizes understanding.  Clayton Bibles, MSA, MSN, CNM Certified Nurse Midwife, [facility] 12/31/21 12:53 PM

## 2022-01-04 ENCOUNTER — Encounter: Payer: Self-pay | Admitting: Obstetrics and Gynecology

## 2022-01-04 ENCOUNTER — Ambulatory Visit (INDEPENDENT_AMBULATORY_CARE_PROVIDER_SITE_OTHER): Payer: Medicaid Other | Admitting: Obstetrics and Gynecology

## 2022-01-04 VITALS — BP 108/64 | HR 80 | Wt 162.4 lb

## 2022-01-04 DIAGNOSIS — Z90711 Acquired absence of uterus with remaining cervical stump: Secondary | ICD-10-CM | POA: Insufficient documentation

## 2022-01-04 DIAGNOSIS — R87610 Atypical squamous cells of undetermined significance on cytologic smear of cervix (ASC-US): Secondary | ICD-10-CM

## 2022-01-04 DIAGNOSIS — R87810 Cervical high risk human papillomavirus (HPV) DNA test positive: Secondary | ICD-10-CM | POA: Diagnosis not present

## 2022-01-04 DIAGNOSIS — O24430 Gestational diabetes mellitus in the puerperium, diet controlled: Secondary | ICD-10-CM

## 2022-01-04 DIAGNOSIS — O24410 Gestational diabetes mellitus in pregnancy, diet controlled: Secondary | ICD-10-CM

## 2022-01-04 NOTE — Progress Notes (Signed)
Pt presents for wound check s/p supracervical c/hyst placenta accreta on 11/29/21. Small area of open skin.  MD to assess incision

## 2022-01-04 NOTE — Progress Notes (Signed)
Obstetrics and Gynecology PP/Incision Check  Visit  Appointment Date: 01/04/2022  OBGYN Clinic: [facility]   Chief Complaint:  Chief Complaint  Patient presents with   Wound Check    History of Present Illness: Shirley Greene is a 26 y.o. G4P3 s/p scheduled 11/14 C-Hyst and b/l salpingectomies/pre op ureteral stent placement for bleeding, accreta via vertical mid line incision. Her past medical history is significant for for c-sections, GDMa1. Patient discharged to home on 11/17  Patient followed due to some skin separation at the inferior edge of vertical midline incision and has been treated with silver nitrate.   Vaginal bleeding or discharge: No  Breast or formula feeding: both PP depression s/s: No  Any bowel or bladder issues: No  Pap smear:  ascus/HPV positive  Review of Systems: as noted in the History of Present Illness.  Patient Active Problem List   Diagnosis Date Noted   History of hysterectomy leaving cervix intact 01/04/2022   Anemia affecting pregnancy, antepartum 11/14/2021   GDM (gestational diabetes mellitus), class A1 07/11/2021   ASCUS with positive high risk HPV cervical on 04/05/2021 04/10/2021   Alpha thalassemia silent carrier 05/27/2018    Medications Shirley Greene had no medications administered during this visit. Current Outpatient Medications  Medication Sig Dispense Refill   Accu-Chek Softclix Lancets lancets 1 each by Other route 4 (four) times daily. 100 each 12   Blood Glucose Monitoring Suppl (ACCU-CHEK GUIDE) w/Device KIT 1 Device by Does not apply route 4 (four) times daily. 1 kit 0   Continuous Blood Gluc Sensor (FREESTYLE LIBRE 3 SENSOR) MISC 1 Package by Does not apply route as directed. Place 1 sensor on the skin every 14 days. Use to check glucose continuously 2 each 3   Continuous Blood Gluc Transmit (DEXCOM G6 TRANSMITTER) MISC 1 Package by Does not apply route every 3 (three) months. 1 each 2    glucose blood (ACCU-CHEK GUIDE) test strip Use to check blood sugars four times a day was instructed 50 each 12   No current facility-administered medications for this visit.   Allergies Patient has no known allergies.  Physical Exam:  BP 108/64   Pulse 80   Wt 162 lb 6.4 oz (73.7 kg)   Breastfeeding Yes   BMI 31.72 kg/m  Body mass index is 31.72 kg/m. General appearance: Well nourished, well developed female in no acute distress.  Respiratory: Normal respiratory effort Abdomen: no hernias, non ttp and vertical incision looks great with just slight skin separation at the most inferior edge of the incision. No e/o infection and nttp.  Neuro/Psych:  Normal mood and affect.  Skin:  Warm and dry.   Laboratory: none  Assessment: patient doing well  Plan:  *PP: routine care. Patient is doing great considering her surgery. Recommend just watching waiting of the small skin defect and keeping it clean and dry and can do some bacitracin to help it heal *ASCUS/HPV pap: colpo not done during pregnancy. Can do next visit/annual *GDMa1: fasting 2h GTT next visit   RTC 1-2 months  Durene Romans MD Attending Center for [employer] [facility])

## 2022-01-12 ENCOUNTER — Other Ambulatory Visit: Payer: Medicaid Other

## 2022-01-12 ENCOUNTER — Ambulatory Visit: Payer: Medicaid Other | Admitting: Advanced Practice Midwife

## 2022-02-03 ENCOUNTER — Telehealth: Payer: Self-pay | Admitting: *Deleted

## 2022-02-03 MED ORDER — DICLOXACILLIN SODIUM 500 MG PO CAPS: 500.0000 mg | ORAL_CAPSULE | Freq: Four times a day (QID) | ORAL | 0 refills | Status: DC

## 2022-02-03 NOTE — Telephone Encounter (Signed)
Pt called in with symptoms of mastitis, will send in RX per protocol.

## 2022-03-08 ENCOUNTER — Other Ambulatory Visit: Payer: Medicaid Other

## 2022-03-08 ENCOUNTER — Encounter: Payer: Self-pay | Admitting: Family Medicine

## 2022-03-08 ENCOUNTER — Ambulatory Visit: Payer: Medicaid Other | Admitting: Family Medicine

## 2022-03-08 NOTE — Progress Notes (Signed)
Patient did not keep appointment today. She may call to reschedule.  

## 2022-03-09 ENCOUNTER — Other Ambulatory Visit: Payer: Medicaid Other

## 2022-03-09 ENCOUNTER — Ambulatory Visit: Payer: Medicaid Other | Admitting: Advanced Practice Midwife

## 2022-09-27 ENCOUNTER — Ambulatory Visit (HOSPITAL_COMMUNITY)
Admission: EM | Admit: 2022-09-27 | Discharge: 2022-09-27 | Disposition: A | Discharge status: Home / Self Care | Payer: Medicaid Other | Attending: Sports Medicine | Admitting: Sports Medicine

## 2022-09-27 ENCOUNTER — Encounter (HOSPITAL_COMMUNITY): Payer: Self-pay

## 2022-09-27 DIAGNOSIS — M654 Radial styloid tenosynovitis [de Quervain]: Secondary | ICD-10-CM

## 2022-09-27 DIAGNOSIS — M67911 Unspecified disorder of synovium and tendon, right shoulder: Secondary | ICD-10-CM | POA: Diagnosis not present

## 2022-09-27 MED ORDER — MELOXICAM 7.5 MG PO TABS: 15.0000 mg | ORAL_TABLET | Freq: Every day | ORAL | 0 refills | Status: DC

## 2022-09-27 NOTE — ED Provider Notes (Signed)
[facility]    CSN: [id number] Arrival date & time: 09/27/22  1030      History   Chief Complaint No chief complaint on file.   HPI Shirley Greene is a 27 y.o. female here for evaluation of right shoulder and wrist pain.  She notes over the last few weeks she has had soreness and pain in the right shoulder, particularly while sleeping.  She also notes pain at the base of her right thumb radiating up her wrist with some associated numbness tingling into the first 3 digits of the right hand.  She has not tried any medications or rehabilitation exercises for this before.  She has no prior injuries to either her wrist, thumb, or right shoulder.  Of note she does have a 54-month-old infant and notes her pain started after repeatedly lifting and carrying the child.   HPI  Past Medical History:  Diagnosis Date   Alpha thalassemia silent carrier 05/27/2018   Anemia    Cervical dysplasia 02/15/2017   02/2018: pap neg   Chlamydia    Infection    UTI   Sciatica 07/16/2018    Patient Active Problem List   Diagnosis Date Noted   History of hysterectomy leaving cervix intact 01/04/2022   Anemia affecting pregnancy, antepartum 11/14/2021   GDM (gestational diabetes mellitus), class A1 07/11/2021   ASCUS with positive high risk HPV cervical on 04/05/2021 04/10/2021   Alpha thalassemia silent carrier 05/27/2018    Past Surgical History:  Procedure Laterality Date   CESAREAN SECTION N/A 04/05/2012   Procedure: CESAREAN SECTION;  Surgeon: Tilda Burrow, MD;  Location: [facility];  Service: Obstetrics;  Laterality: N/A;   CESAREAN SECTION WITH BILATERAL TUBAL LIGATION N/A 11/28/2018   Procedure: cesarean section;  Surgeon: Hermina Staggers, MD;  [facility]: [facility];  Service: Obstetrics;  Laterality: N/A;   DILATION AND EVACUATION N/A 11/19/2020   Procedure: DILATATION AND EVACUATION;  Surgeon: Myna Hidalgo, DO;  Location: [facility];  Service: Gynecology;  Laterality: N/A;    OPERATIVE ULTRASOUND N/A 11/19/2020   Procedure: OPERATIVE ULTRASOUND;  Surgeon: Myna Hidalgo, DO;  Location: [facility];  Service: Gynecology;  Laterality: N/A;    OB History     Gravida  4   Para  2   Term  2   Preterm      AB  1   Living  2      SAB      IAB  1   Ectopic      Multiple  0   Live Births  2        Obstetric Comments  "OP" presentation, got stuck at 9cm          Home Medications    Prior to Admission medications   Medication Sig Start Date End Date Taking? Authorizing Provider  Accu-Chek Softclix Lancets lancets 1 each by Other route 4 (four) times daily. 07/14/21  Yes Weinhold, Lelon Mast C, CNM  Blood Glucose Monitoring Suppl (ACCU-CHEK GUIDE) w/Device KIT 1 Device by Does not apply route 4 (four) times daily. 07/14/21  Yes Weinhold, Lelon Mast C, CNM  Continuous Blood Gluc Sensor (FREESTYLE LIBRE 3 SENSOR) MISC 1 Package by Does not apply route as directed. Place 1 sensor on the skin every 14 days. Use to check glucose continuously 09/21/21  Yes Anyanwu, Jethro Bastos, MD  Continuous Blood Gluc Transmit (DEXCOM G6 TRANSMITTER) MISC 1 Package by Does not apply route every 3 (three) months. 09/20/21  Yes  Anyanwu, Jethro Bastos, MD  dicloxacillin (DYNAPEN) 500 MG capsule Take 1 capsule (500 mg total) by mouth 4 (four) times daily. 02/03/22  Yes Federico Flake, MD  glucose blood (ACCU-CHEK GUIDE) test strip Use to check blood sugars four times a day was instructed 07/14/21  Yes Weinhold, Lelon Mast C, CNM  meloxicam (MOBIC) 7.5 MG tablet Take 2 tablets (15 mg total) by mouth daily. 09/27/22  Yes Marisa Cyphers, MD    Family History Family History  Problem Relation Age of Onset   Hypertension Mother    Hypertension Father    Cancer Maternal Grandfather     Social History Social History   Tobacco Use   Smoking status: Never   Smokeless tobacco: Never  Vaping Use   Vaping status: Never Used  Substance Use Topics   Alcohol use: Not Currently    Comment:  twice a month   Drug use: No     Allergies   Patient has no known allergies.   Review of Systems Review of Systems  Musculoskeletal:  Positive for arthralgias and joint swelling.  Skin:  Negative for color change, pallor and rash.     Physical Exam Vital Signs BP 125/71 (BP Location: Left Arm)   Pulse 91   Temp 98.1 F (36.7 C) (Oral)   Resp 16   SpO2 97%      Physical Exam Constitutional:      Appearance: Normal appearance. She is normal weight.  Neurological:     Mental Status: She is alert.   Right Shoulder MSK Exam: No swelling, ecchymoses.  No gross deformity. No TTP. FROM. Negative Hawkins, Neers. Negative Yergasons. Strength 5/5 with empty can and resisted internal/external rotation. Negative apprehension. NV intact distally.   Right Wrist MSK Exam: No deformity, swelling, ecchymosis. Reproducible tenderness to palpation along the dorsal first compartment.  No tenderness to palpation over the intersection of 1st and 2nd compartments. Full range of motion in fingers and wrist. Positive Finkelstein's. Negative Tinel's and Phalen's.   UC Treatments / Results  Labs (all labs ordered are listed, but only abnormal results are displayed) Labs Reviewed - No data to display  EKG   Radiology No results found.  Procedures Procedures (including critical care time)  Medications Ordered in UC Medications - No data to display  Initial Impression / Assessment and Plan / UC Course  I have reviewed the triage vital signs and the nursing notes.  Pertinent labs & imaging results that were available during my care of the patient were reviewed by me and considered in my medical decision making (see chart for details).    History and physical exam consistent with both de Quervain's tenosynovitis in the right wrist as well as rotator cuff tendinopathy in the right shoulder.  I reviewed with the patient recommendation for relative rest and splinting  immobilization of the right thumb for the next 4 to 6 weeks as tolerated. She was provided a thumb spica brace during today's visit. Prescription for meloxicam 15 mg to take daily provided - she denies current pregnancy or breastfeeding.   Also reviewed with her rotator cuff rehabilitation exercises to do over the next 4 to 6 weeks as well.  If she is failing to improve recommend follow-up with sports medicine office in 4 to 6 weeks for consideration of corticosteroid injection for either dorsal first compartment or subacromial bursitis.  Questions were answered and she is in agreement with this plan.  Final Clinical Impressions(s) / UC Diagnoses  Final diagnoses:  De Quervain's tenosynovitis, right  Tendinopathy of rotator cuff, right     Discharge Instructions      You have DeQuervain's Tenosynovitis in the right wrist and Rotator Cuff Tendonitis of the right shoulder. I want you to use the wrist brace as much as you can. Start the rehab exercises for the right shoulder after getting a light resistance band online.  I have sent you Meloxicam to take 15 mg once daily for the next 3 weeks. Avoid taking with ibuprofen or aleve, but can take with Tylenol 1000mg  up to three times daily.  If you are failing to improve over the next 3-4 weeks, follow-up with the Sports Medicine Center below. [facility] Address: [address] [place], [place], [place] [address] Phone: [phone]     ED Prescriptions     Medication Sig Dispense Auth. Provider   meloxicam (MOBIC) 7.5 MG tablet Take 2 tablets (15 mg total) by mouth daily. 42 tablet Marisa Cyphers, MD      PDMP not reviewed this encounter.   Marisa Cyphers, MD 09/27/22 1324

## 2022-09-27 NOTE — ED Triage Notes (Signed)
Here for right arm pain and numbness x 1 month.

## 2022-09-27 NOTE — Discharge Instructions (Signed)
You have DeQuervain's Tenosynovitis in the right wrist and Rotator Cuff Tendonitis of the right shoulder. I want you to use the wrist brace as much as you can. Start the rehab exercises for the right shoulder after getting a light resistance band online.  I have sent you Meloxicam to take 15 mg once daily for the next 3 weeks. Avoid taking with ibuprofen or aleve, but can take with Tylenol 1000mg  up to three times daily.  If you are failing to improve over the next 3-4 weeks, follow-up with the Sports Medicine Center below. [facility] Address: [address] [place], [place], [place] [address] Phone: [phone]

## 2022-10-02 ENCOUNTER — Telehealth: Payer: Self-pay

## 2022-10-02 NOTE — Telephone Encounter (Signed)
Left message for pt to call office back regarding message left w/ answering services.

## 2022-10-05 ENCOUNTER — Ambulatory Visit (INDEPENDENT_AMBULATORY_CARE_PROVIDER_SITE_OTHER): Payer: Medicaid Other | Admitting: *Deleted

## 2022-10-05 ENCOUNTER — Other Ambulatory Visit (HOSPITAL_COMMUNITY)
Admission: RE | Admit: 2022-10-05 | Discharge: 2022-10-05 | Disposition: A | Discharge status: Home / Self Care | Payer: Medicaid Other | Source: Ambulatory Visit | Attending: Obstetrics and Gynecology | Admitting: Obstetrics and Gynecology

## 2022-10-05 DIAGNOSIS — N898 Other specified noninflammatory disorders of vagina: Secondary | ICD-10-CM | POA: Diagnosis not present

## 2022-10-05 NOTE — Progress Notes (Signed)
SUBJECTIVE:  27 y.o. female complains of dark vaginal discharge for few day(s). Denies abnormal vaginal bleeding or significant pelvic pain or fever. No UTI symptoms. Denies history of known exposure to STD.  No LMP recorded.  OBJECTIVE:  She appears well, afebrile. Urine dipstick: not done.  ASSESSMENT:  Vaginal Discharge     PLAN:  GC, chlamydia, trichomonas, BVAG, CVAG probe sent to lab. Treatment: To be determined once lab results are received ROV prn if symptoms persist or worsen  Scheryl Marten, RN .

## 2022-10-06 LAB — CERVICOVAGINAL ANCILLARY ONLY
Bacterial Vaginitis (gardnerella): NEGATIVE
Candida Glabrata: NEGATIVE
Candida Vaginitis: NEGATIVE
Chlamydia: NEGATIVE
Comment: NEGATIVE
Comment: NEGATIVE
Comment: NEGATIVE
Comment: NEGATIVE
Comment: NEGATIVE
Comment: NORMAL
Neisseria Gonorrhea: NEGATIVE
Trichomonas: NEGATIVE

## 2022-10-26 ENCOUNTER — Encounter: Payer: Self-pay | Admitting: Obstetrics and Gynecology

## 2022-10-26 ENCOUNTER — Other Ambulatory Visit (HOSPITAL_COMMUNITY)
Admission: RE | Admit: 2022-10-26 | Discharge: 2022-10-26 | Disposition: A | Discharge status: Home / Self Care | Payer: Medicaid Other | Source: Ambulatory Visit | Attending: Obstetrics and Gynecology | Admitting: Obstetrics and Gynecology

## 2022-10-26 ENCOUNTER — Ambulatory Visit: Payer: Medicaid Other | Admitting: Obstetrics and Gynecology

## 2022-10-26 VITALS — BP 126/86 | HR 98 | Wt 196.0 lb

## 2022-10-26 DIAGNOSIS — F419 Anxiety disorder, unspecified: Secondary | ICD-10-CM | POA: Diagnosis not present

## 2022-10-26 DIAGNOSIS — Z01419 Encounter for gynecological examination (general) (routine) without abnormal findings: Secondary | ICD-10-CM

## 2022-10-26 DIAGNOSIS — R87610 Atypical squamous cells of undetermined significance on cytologic smear of cervix (ASC-US): Secondary | ICD-10-CM | POA: Insufficient documentation

## 2022-10-26 DIAGNOSIS — Z1151 Encounter for screening for human papillomavirus (HPV): Secondary | ICD-10-CM | POA: Insufficient documentation

## 2022-10-26 MED ORDER — SERTRALINE HCL 25 MG PO TABS: 25.0000 mg | ORAL_TABLET | Freq: Every day | ORAL | 3 refills | Status: DC

## 2022-10-26 NOTE — Progress Notes (Signed)
Last pap: 04/05/21  CC:pt has had HYST pt notes spotting on/off

## 2022-10-26 NOTE — Progress Notes (Signed)
Subjective:     Shirley Greene is a 27 y.o. female P3  post hysterectomy with BMI 38who is here for a comprehensive physical exam. The patient reports no problems. 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 She reports some light spotting monthly. She denies any pelvic pain or abnormal discharge. She is sexually active without complaint. Patient admits to a lot of anxiety and depression which has worsened over time. It started following her delivery and felt she could manage it on her own but is now seeking assistance.  Past Medical History:  Diagnosis Date   Alpha thalassemia silent carrier 05/27/2018   Anemia    Cervical dysplasia 02/15/2017   02/2018: pap neg   Chlamydia    Infection    UTI   Sciatica 07/16/2018   Past Surgical History:  Procedure Laterality Date   CESAREAN SECTION N/A 04/05/2012   Procedure: CESAREAN SECTION;  Surgeon: Tilda Burrow, MD;  Location: [facility];  Service: Obstetrics;  Laterality: N/A;   CESAREAN SECTION WITH BILATERAL TUBAL LIGATION N/A 11/28/2018   Procedure: cesarean section;  Surgeon: Hermina Staggers, MD;  [facility];  Service: Obstetrics;  Laterality: N/A;   DILATION AND EVACUATION N/A 11/19/2020   Procedure: DILATATION AND EVACUATION;  Surgeon: Myna Hidalgo, DO;  Location: [facility];  Service: Gynecology;  Laterality: N/A;   OPERATIVE ULTRASOUND N/A 11/19/2020   Procedure: OPERATIVE ULTRASOUND;  Surgeon: Myna Hidalgo, DO;  Location: [facility];  Service: Gynecology;  Laterality: N/A;   Family History  Problem Relation Age of Onset   Hypertension Mother    Hypertension Father    Cancer Maternal Grandfather     Social History   Socioeconomic History   Marital status: Single    Spouse name: Not on file   Number of children: Not on file   Years of education: Not on file   Highest education level: Not on file  Occupational History   Not on file  Tobacco Use   Smoking status: Never    Smokeless tobacco: Never  Vaping Use   Vaping status: Never Used  Substance and Sexual Activity   Alcohol use: Not Currently    Comment: twice a month   Drug use: No   Sexual activity: Not Currently    Birth control/protection: None  Other Topics Concern   Not on file  Social History Narrative   Not on file   Social Determinants of Health   Financial Resource Strain: Not on file  Food Insecurity: Food Insecurity Present (11/29/2021)   [facility] to [facility]18[facility]22[facility] visits prior to 03/18/2022.   Hunger Vital Sign    Worried About Running Out of Food in the Last Year: Sometimes true    Ran Out of Food in the Last Year: Sometimes true  Transportation Needs: Not on file  Physical Activity: Not on file  Stress: Not on file  Social Connections: Not on file  Intimate Partner Violence: Not At Risk (11/29/2021)   [facility] from [facility] visits prior to 03/18/2022., [facility] visits prior to 03/18/2022.   Humiliation, Afraid, Rape, and Kick questionnaire    Fear of Current or Ex-Partner: No    Emotionally Abused: No    Physically Abused: No    Sexually Abused: No   Health Maintenance  Topic Date Due   HPV  VACCINES (2 - 3-dose series) 12/27/2015   Cervical Cancer Screening (Pap smear)  04/06/2022   INFLUENZA VACCINE  08/17/2022   COVID-19 Vaccine (1 - 2023-24 season) Never done   DTaP/Tdap/Td (3 - Td or Tdap) 09/09/2028   Hepatitis C Screening  Completed   HIV Screening  Completed       Review of Systems Pertinent items noted in HPI and remainder of comprehensive ROS otherwise negative.   Objective:  Blood pressure 126/86, pulse 98, weight 196 lb (88.9 kg), currently breastfeeding.   GENERAL: Well-developed, well-nourished female in no acute distress.  HEENT: Normocephalic, atraumatic. Sclerae anicteric.  NECK: Supple. Normal  thyroid.  LUNGS: Clear to auscultation bilaterally.  HEART: Regular rate and rhythm. BREASTS: Symmetric in size. No palpable masses or lymphadenopathy, skin changes, or nipple drainage. ABDOMEN: Soft, nontender, nondistended. No organomegaly. PELVIC: Normal external female genitalia. Vagina is pink and rugated.  Normal discharge. Normal appearing cervix. No adnexal mass or tenderness. Chaperone present during the pelvic exam EXTREMITIES: No cyanosis, clubbing, or edema, 2+ distal pulses.     Assessment:    Healthy female exam.      Plan:    Pap smear collected Patient will be contacted with abnormal results Rx zoloft provided Patient referred to behavioral health See After Visit Summary for Counseling Recommendations

## 2022-10-30 ENCOUNTER — Ambulatory Visit: Payer: Medicaid Other | Admitting: Family Medicine

## 2022-11-01 LAB — CYTOLOGY - PAP
Comment: NEGATIVE
Diagnosis: UNDETERMINED — AB
High risk HPV: NEGATIVE

## 2022-11-03 ENCOUNTER — Telehealth: Payer: Self-pay | Admitting: *Deleted

## 2022-11-03 NOTE — Telephone Encounter (Signed)
Pt informed of abnormal pap and need for a colpo, appt scheduled.

## 2022-11-03 NOTE — Telephone Encounter (Signed)
-----   Message from [facility] sent at 11/02/2022 10:22 AM EDT ----- Please inform patient of abnormal pap smear and need for colposcopy (per ASCCP guidelines) given history of abnormal pap smear last March

## 2022-11-06 ENCOUNTER — Encounter: Payer: Self-pay | Admitting: Family Medicine

## 2022-11-06 ENCOUNTER — Other Ambulatory Visit: Payer: Self-pay

## 2022-11-06 ENCOUNTER — Ambulatory Visit (INDEPENDENT_AMBULATORY_CARE_PROVIDER_SITE_OTHER): Payer: Medicaid Other | Admitting: Family Medicine

## 2022-11-06 VITALS — BP 121/64 | Ht 60.0 in | Wt 196.0 lb

## 2022-11-06 DIAGNOSIS — M654 Radial styloid tenosynovitis [de Quervain]: Secondary | ICD-10-CM

## 2022-11-06 NOTE — Patient Instructions (Signed)
You have deQuervain's tenosynovitis of your thumb/wrist. Avoid painful activities as much as possible. Ice 15 minutes at a time 3-4 times a day. Take meloxicam for pain and inflammation as needed. A cortisone injection typically helps a great deal with this and is an option. Wear the brace as much as possible for the next week Follow up with me in 1 month for reevaluation (or as needed if you're doing great).

## 2022-11-06 NOTE — Progress Notes (Signed)
PCP: Patient, No Pcp Per  Subjective:   HPI: Patient is a 27 y.o. female here for right wrist pain.  Patient reports about 2 months of right wrist pain. No acute injury or trauma. She has an 39 month old and does lift him a lot, pain more with this. Was seen in urgent care and provided meloxicam and a thumb spica brace - not much benefit with these and hard to wear the brace and do things. She is right handed. Some numbness/tingling previously but none now. Does have a burning quality to the pain.  Past Medical History:  Diagnosis Date   Alpha thalassemia silent carrier 05/27/2018   Anemia    Cervical dysplasia 02/15/2017   02/2018: pap neg   Chlamydia    Infection    UTI   Sciatica 07/16/2018    Current Outpatient Medications on File Prior to Visit  Medication Sig Dispense Refill   Accu-Chek Softclix Lancets lancets 1 each by Other route 4 (four) times daily. (Patient not taking: Reported on 10/26/2022) 100 each 12   Blood Glucose Monitoring Suppl (ACCU-CHEK GUIDE) w/Device KIT 1 Device by Does not apply route 4 (four) times daily. (Patient not taking: Reported on 10/26/2022) 1 kit 0   Continuous Blood Gluc Sensor (FREESTYLE LIBRE 3 SENSOR) MISC 1 Package by Does not apply route as directed. Place 1 sensor on the skin every 14 days. Use to check glucose continuously (Patient not taking: Reported on 10/26/2022) 2 each 3   Continuous Blood Gluc Transmit (DEXCOM G6 TRANSMITTER) MISC 1 Package by Does not apply route every 3 (three) months. (Patient not taking: Reported on 10/26/2022) 1 each 2   dicloxacillin (DYNAPEN) 500 MG capsule Take 1 capsule (500 mg total) by mouth 4 (four) times daily. (Patient not taking: Reported on 10/26/2022) 40 capsule 0   glucose blood (ACCU-CHEK GUIDE) test strip Use to check blood sugars four times a day was instructed (Patient not taking: Reported on 10/26/2022) 50 each 12   meloxicam (MOBIC) 7.5 MG tablet Take 2 tablets (15 mg total) by mouth daily.  (Patient not taking: Reported on 10/26/2022) 42 tablet 0   sertraline (ZOLOFT) 25 MG tablet Take 1 tablet (25 mg total) by mouth daily. 30 tablet 3   No current facility-administered medications on file prior to visit.    Past Surgical History:  Procedure Laterality Date   CESAREAN SECTION N/A 04/05/2012   Procedure: CESAREAN SECTION;  Surgeon: Tilda Burrow, MD;  Location: [facility];  Service: Obstetrics;  Laterality: N/A;   CESAREAN SECTION WITH BILATERAL TUBAL LIGATION N/A 11/28/2018   Procedure: cesarean section;  Surgeon: Hermina Staggers, MD;  [facility];  Service: Obstetrics;  Laterality: N/A;   DILATION AND EVACUATION N/A 11/19/2020   Procedure: DILATATION AND EVACUATION;  Surgeon: Myna Hidalgo, DO;  Location: [facility];  Service: Gynecology;  Laterality: N/A;   OPERATIVE ULTRASOUND N/A 11/19/2020   Procedure: OPERATIVE ULTRASOUND;  Surgeon: Myna Hidalgo, DO;  Location: [facility];  Service: Gynecology;  Laterality: N/A;    No Known Allergies  BP 121/64   Ht 5' (1.524 m)   Wt 196 lb (88.9 kg)   BMI 38.28 kg/m       No data to display              No data to display              Objective:  Physical Exam:  Gen: NAD, comfortable in exam room  Right wrist:  No deformity, swelling, bruising. FROM with 5/5 strength. Tenderness to palpation 1st dorsal compartment.  No 1st CMC, carpal tunnel tenderness. NVI distally. Negative tinels, phalens.   Positive finkelsteins.   Assessment & Plan:  1. Right dequervains tenosynovitis - discussed options.  Not improving with meloxicam and thumb spica brace.  Proceeded with injection today.  Icing, brace for next week.  Continue meloxicam if needed.  F/u in 1 month.  After informed written consent timeout was performed.  Patient was seated on exam table.  Area overlying right 1st dorsal compartment prepped with alcohol swabs then utilizing ultrasound guidance, patient's right 1st dorsal compartment was injected with  0.5:0.36mL lidocaine: depomedrol.  Patient tolerated procedure well without immediate complications.

## 2022-11-13 ENCOUNTER — Other Ambulatory Visit: Payer: Self-pay | Admitting: Sports Medicine

## 2022-11-28 ENCOUNTER — Encounter: Payer: Self-pay | Admitting: Obstetrics and Gynecology

## 2022-11-28 ENCOUNTER — Ambulatory Visit: Payer: Medicaid Other | Admitting: Obstetrics and Gynecology

## 2022-11-28 VITALS — BP 123/85 | HR 85 | Wt 196.0 lb

## 2022-11-28 DIAGNOSIS — N870 Mild cervical dysplasia: Secondary | ICD-10-CM

## 2022-11-28 DIAGNOSIS — R87610 Atypical squamous cells of undetermined significance on cytologic smear of cervix (ASC-US): Secondary | ICD-10-CM

## 2022-11-28 HISTORY — PX: COLPOSCOPY: PRO47

## 2022-11-28 NOTE — Procedures (Signed)
Colposcopy Procedure Note  Pre-operative Diagnosis:  10/2022: ascuc/hpv neg 11/2021: c-supra-cervical hyst for accreta 03/2021 pap: ascus/hpv+  Post-operative Diagnosis: CIN 1  Procedure Details  Cervical exam performed in the presence of a chaperone The risks (including infection, bleeding, pain) and benefits of the procedure were explained to the patient and written informed consent was obtained.  The patient was placed in the dorsal lithotomy position. A Pederson was speculum inserted in the vagina, and the cervix was visualized.  Acetic acid staining was done and the cervix was viewed with green filter; lugol's staining with green filter was also done .  There was no bleeding after procedure.   Findings: slight AWE changes at 12 o'clock  Adequate: Yes  Specimens: none  Condition: Stable  Complications: None  Plan: Repeat pap and hpv testing in one year d/w patient  Patient also denies any tobacco use or 2nd hand smoke exposure.  The patient was advised to call for any fever or for prolonged or severe pain or bleeding. She was advised to use OTC analgesics as needed for mild to moderate pain. She was advised to avoid vaginal intercourse for 48 hours or until the bleeding has completely stopped.   Cornelia Copa MD Attending Center for [employer] [facility])

## 2022-11-28 NOTE — Progress Notes (Signed)
RGYN pt presents for Colpo.   Last Pap: 10/26/22 ASC-US  HPV :Negative

## 2022-12-04 ENCOUNTER — Ambulatory Visit (INDEPENDENT_AMBULATORY_CARE_PROVIDER_SITE_OTHER): Payer: Medicaid Other | Admitting: Family Medicine

## 2022-12-04 ENCOUNTER — Other Ambulatory Visit: Payer: Self-pay

## 2022-12-04 VITALS — BP 114/76 | Ht 60.0 in | Wt 196.0 lb

## 2022-12-04 DIAGNOSIS — M654 Radial styloid tenosynovitis [de Quervain]: Secondary | ICD-10-CM

## 2022-12-04 MED ORDER — MELOXICAM 15 MG PO TABS: 15.0000 mg | ORAL_TABLET | Freq: Every day | ORAL | 0 refills | Status: DC

## 2022-12-04 NOTE — Progress Notes (Signed)
PCP: Patient, No Pcp Per  Subjective:   HPI: Patient is a 27 y.o. female here for follow-up of right de Quervain's tenosynovitis.  Previously seen 10/21 and given a right first dorsal compartment injection.  Instructed to continue meloxicam as needed.  She states that the injection helped for about 2 weeks, but the pain has come back.  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  Past Medical History:  Diagnosis Date   Alpha thalassemia silent carrier 05/27/2018   Anemia    Cervical dysplasia 02/15/2017   02/2018: pap neg   Chlamydia    GDM (gestational diabetes mellitus), class A1 07/11/2021   Infection    UTI   Sciatica 07/16/2018    Current Outpatient Medications on File Prior to Visit  Medication Sig Dispense Refill   Accu-Chek Softclix Lancets lancets 1 each by Other route 4 (four) times daily. (Patient not taking: Reported on 10/26/2022) 100 each 12   Blood Glucose Monitoring Suppl (ACCU-CHEK GUIDE) w/Device KIT 1 Device by Does not apply route 4 (four) times daily. (Patient not taking: Reported on 10/26/2022) 1 kit 0   Continuous Blood Gluc Sensor (FREESTYLE LIBRE 3 SENSOR) MISC 1 Package by Does not apply route as directed. Place 1 sensor on the skin every 14 days. Use to check glucose continuously (Patient not taking: Reported on 10/26/2022) 2 each 3   Continuous Blood Gluc Transmit (DEXCOM G6 TRANSMITTER) MISC 1 Package by Does not apply route every 3 (three) months. (Patient not taking: Reported on 10/26/2022) 1 each 2   dicloxacillin (DYNAPEN) 500 MG capsule Take 1 capsule (500 mg total) by mouth 4 (four) times daily. (Patient not taking: Reported on 10/26/2022) 40 capsule 0   glucose blood (ACCU-CHEK GUIDE) test strip Use to check blood sugars four times a day was instructed (Patient not taking: Reported on 10/26/2022) 50 each 12   meloxicam (MOBIC) 7.5 MG tablet Take 2 tablets (15 mg total) by mouth daily. (Patient not  taking: Reported on 10/26/2022) 42 tablet 0   sertraline (ZOLOFT) 25 MG tablet Take 1 tablet (25 mg total) by mouth daily. 30 tablet 3   No current facility-administered medications on file prior to visit.    Past Surgical History:  Procedure Laterality Date   CESAREAN SECTION N/A 04/05/2012   Procedure: CESAREAN SECTION;  Surgeon: Tilda Burrow, MD;  Location: [facility];  Service: Obstetrics;  Laterality: N/A;   CESAREAN SECTION WITH BILATERAL TUBAL LIGATION N/A 11/28/2018   Procedure: cesarean section;  Surgeon: Hermina Staggers, MD;  Location: [facility];  Service: Obstetrics;  Laterality: N/A;   COLPOSCOPY  11/28/2022   DILATION AND EVACUATION N/A 11/19/2020   Procedure: DILATATION AND EVACUATION;  Surgeon: Myna Hidalgo, DO;  Location: [facility];  Service: Gynecology;  Laterality: N/A;   OPERATIVE ULTRASOUND N/A 11/19/2020   Procedure: OPERATIVE ULTRASOUND;  Surgeon: Myna Hidalgo, DO;  Location: [facility];  Service: Gynecology;  Laterality: N/A;    No Known Allergies  BP 114/76   Ht 5' (1.524 m)   Wt 196 lb (88.9 kg)   BMI 38.28 kg/m       No data to display              No data to display              Objective:  Physical Exam:  Gen: NAD, comfortable in exam room  RIGHT HAND/WRIST: Wrist clean/dry/intact skin. Full ROM  with pain on ulnar deviation felt on radial side.  Normal grip strength.  No soft tissue swelling.  Mild tenderness to palpation along 1st extensor compartment.  No bony tenderness.  No snuff box tenderness.  Positive tinels at carpal tunnel, positive Phalen's.  Positive Finkelstein's.   Limited US right wrist: Anechoic fluid collection surrounding APL and EPB, median nerve 0.12 cm area   Assessment & Plan:  1.  Right de Quervain's tenosynovitis: Transient improvement after steroid injection and conservative management.  Also with tingling/numbness of the fingers. Limited ultrasound with confirmatory findings and less likely contribution of carpal  tunnel given only 0.12 cm area.  Discussed repeat injection versus referral to hand surgery.  Given lack of robust response from initial injection, elected for referral to hand surgery.  Continue meloxicam and bracing in the meantime.  Follow-up as needed.  Janeal Holmes, MD PGY-2, [facility]

## 2022-12-19 ENCOUNTER — Ambulatory Visit (HOSPITAL_COMMUNITY): Payer: Medicaid Other | Admitting: Licensed Clinical Social Worker

## 2022-12-19 DIAGNOSIS — F411 Generalized anxiety disorder: Secondary | ICD-10-CM

## 2022-12-19 DIAGNOSIS — F321 Major depressive disorder, single episode, moderate: Secondary | ICD-10-CM | POA: Insufficient documentation

## 2022-12-19 NOTE — Progress Notes (Signed)
Comprehensive Clinical Assessment (CCA) Note  12/19/2022 Shirley Greene [id number]  Chief Complaint:  Chief Complaint  Patient presents with   Depression   Anxiety   Visit Diagnosis: Postpartum depression and generalized anxiety disorder.  Virtual Visit via Video Note  I connected with Shirley Greene on 12/19/22 at 11:00 AM EST by a video enabled telemedicine application and verified that I am speaking with the correct person using two identifiers.  Location: Patient: Shirley Greene Provider: [facility]   I discussed the limitations of evaluation and management by telemedicine and the availability of in person appointments. The patient expressed understanding and agreed to proceed.  Client is a 27 year old female. Client is referred by OB/GYN for a depression and anxiety.   Client states mental health symptoms as evidenced by:   Depression Change in energy/activity; Fatigue; Irritability; Sleep (too much or little); Weight gain/lossDepression. Change in energy/activity; Fatigue; Irritability; Sleep (too much or little); Weight gain/loss. The comment is weight gain. Taken on 12/19/22 1118 Change in energy/activity; Fatigue; Irritability; Sleep (too much or little); Weight gain/lossDepression. Change in energy/activity; Fatigue; Irritability; Sleep (too much or little); Weight gain/loss. The comment is weight gain. Last Filed Value  Duration of Depressive Symptoms Greater than two weeks Greater than two weeks  Mania Irritability Irritability  Anxiety Restlessness; Irritability; Fatigue; Difficulty concentrating; Sleep; Tension; Worrying Restlessness; Irritability; Fatigue; Difficulty concentrating; Sleep; Tension; Worrying  Psychosis None None  Obsessions None None  Compulsions None None  Inattention None None  Hyperactivity/Impulsivity N/A; None N/A; None  Oppositional/Defiant Behaviors None None  Emotional Irregularity Chronic feelings of emptiness  Chronic feelings of emptiness    Client denies suicidal and homicidal ideations at this time  Client denies hallucinations and delusions at this time    Client was screened for the following SDOH:    Exercise, stress\tension, social interaction, and PHQ-9.  Assessment Information that integrates subjective and objective details with a therapist's professional interpretation:    Patient was alert and oriented x 5.  Patient was pleasant, cooperative, and made good eye contact.  She was dressed casually and engaged well in therapy session.  She presented today with anxious and depressed mood\affect.  Patient reports primary stressors today are postpartum depression, anxiety, and poor coping skills.  Patient reports that she struggles with time management and balancing all the things that her family needs for her children, herself, and her significant other.  She reports poor support system as family lives out of state and her significant other works full-time.  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  Patient reports that she was referred here by gynecologist after they prescribed her antidepressants that she did not want to take due to possible side effects and dependence.  Patient at this time is interested in medication management and therapy at [facility].  Patient would like to create coping skills and hobbies to help decrease depression and anxiety to be able to prioritize herself and her family at the same time.  Client states use of the following substances: None reported  Clinician assisted client with scheduling the following appointments: January 27 virtual. Clinician details of appointment.    Client was in agreement with treatment recommendations.      I discussed the assessment  and treatment plan with the patient. The patient was provided an opportunity to ask questions  and all were answered. The patient agreed with the plan and demonstrated an understanding of the instructions.   The patient was advised to call back or seek an in-person evaluation if the symptoms worsen or if the condition fails to improve as anticipated.  I provided 40 minutes of non-face-to-face time during this encounter.   Shirley Cooks, LCSW    CCA Screening, Triage and Referral (STR)  Patient Reported Information Referral name: OBGYN  Whom do you see for routine medical problems? I don't have a doctor  How Long Has This Been Causing You Problems? > than 6 months  What Do You Feel Would Help You the Most Today? Treatment for Depression or other mood problem; Medication(s)   Have You Recently Been in Any Inpatient Treatment ([facility]/Detox/Crisis Center/28-Day Program)? No  Have You Ever Received Services From [facility] Before? Yes  Who Do You See at [facility]? OBGYN   Have You Recently Had Any Thoughts About Hurting Yourself? No  Are You Planning to Commit Suicide/Harm Yourself At This time? No   Have you Recently Had Thoughts About Hurting Someone Shirley Greene? No  Explanation: No data recorded  Have You Used Any Alcohol or Drugs in the Past 24 Hours? No  Do You Currently Have a Therapist/Psychiatrist? No   Have You Been Recently Discharged From Any Office Practice or Programs? No     CCA Screening Triage Referral Assessment Type of Contact: Tele-Assessment  Is this Initial or Reassessment? Initial Assessment  Date Telepsych consult ordered in [facility]:  12/19/22  Is CPS involved or ever been involved? Never  Is APS involved or ever been involved? Never   Patient Determined To Be At Risk for Harm To Self or Others Based on Review of Patient Reported Information or Presenting Complaint? No  Method: No Plan  Availability of Means: No access or NA  Intent:  Vague intent or NA  Notification Required: No need or identified person  Are There Guns or Other Weapons in Your Home? No  Are These Weapons Safely Secured?                            No  Location of Assessment: [facility]  Does Patient Present under Involuntary Commitment? No [place] of Residence: [place]  Options For Referral: Medication Management; Outpatient Therapy  CCA Biopsychosocial Intake/Chief Complaint:  increased depression and anxiety in the last 9 months with postpartum depression  Current Symptoms/Problems: tesnion, worry, not coping well with anxiety.  Patient Reported Schizophrenia/Schizoaffective Diagnosis in Past: No  Strengths: willing to engage in treatment  Preferences: medication and therapy  Abilities: none reported  Type of Services Patient Feels are Needed: coping skills to help with anxiety  Initial Clinical Notes/Concerns: increased depression and anxiety postpartum  Mental Health Symptoms Depression:   Change in energy/activity; Fatigue; Irritability; Sleep (too much or little); Weight gain/loss (weight gain)   Duration of Depressive symptoms:  Greater than two weeks   Mania:   Irritability   Anxiety:    Restlessness; Irritability; Fatigue; Difficulty concentrating; Sleep; Tension; Worrying   Psychosis:   None   Duration of Psychotic symptoms: No data recorded  Trauma:  No data recorded  Obsessions:   None   Compulsions:   None   Inattention:   None   Hyperactivity/Impulsivity:   N/A; None   Oppositional/Defiant Behaviors:   None   Emotional Irregularity:   Chronic feelings of emptiness   Other Mood/Personality  Symptoms:  No data recorded   Mental Status Exam Appearance and self-care  Stature:   Average   Weight:   Obese   Clothing:   Casual   Grooming:   Normal   Cosmetic use:   Age appropriate   Posture/gait:   Normal   Motor activity:   Not Remarkable   Sensorium  Attention:    Normal   Concentration:   Normal   Orientation:   X5   Recall/memory:   Normal   Affect and Mood  Affect:   Anxious; Restricted   Mood:   Anxious   Relating  Eye contact:   Normal   Facial expression:   Constricted   Attitude toward examiner:   Cooperative   Thought and Language  Speech flow:  Clear and Coherent   Thought content:   Appropriate to Mood and Circumstances   Preoccupation:   None   Hallucinations:   None   Organization:  No data recorded  [employer] of Knowledge:   Good   Intelligence:   Average   Abstraction:   Functional   Judgement:   Fair   Reality Testing:   Realistic   Insight:   Good   Decision Making:   Normal   Social Functioning  Social Maturity:   Isolates   Social Judgement:   Normal   Stress  Stressors:   Family conflict; Other (Comment); Relationship (being a single parent and feeling overwhelmed with all the things needed for the children)   Coping Ability:   Exhausted; Overwhelmed   Skill Deficits:   Activities of daily living; Responsibility; Interpersonal   Supports:   Family     Religion: Religion/Spirituality Are You A Religious Person?: Yes What is Your Religious Affiliation?: Non-Denominational  Leisure/Recreation: Leisure / Recreation Do You Have Hobbies?: No  Exercise/Diet: Exercise/Diet Do You Exercise?: No Have You Gained or Lost A Significant Amount of Weight in the Past Six Months?: Yes-Gained Number of Pounds Gained: 20 Do You Follow a Special Diet?: No Do You Have Any Trouble Sleeping?: Yes Explanation of Sleeping Difficulties: not sleeping enough   CCA Employment/Education Employment/Work Situation: Employment / Work Situation Employment Situation: Employed 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?: No  Education: Education Is Patient Currently Attending School?: No Last Grade Completed: 13 Did Garment/textile technologist From [employer]?: Yes Did Theme park manager?: Yes What Type of College Degree Do you Have?: cosmetologist Did You Attend Graduate School?: No Did You Have An Individualized Education Program (IIEP): No Did You Have Any Difficulty At School?: No Patient's Education Has Been Impacted by Current Illness: No   CCA Family/Childhood History Family and Relationship History: Family history Marital status: Long term relationship Long term relationship, how long?: 2 years What types of issues is patient dealing with in the relationship?: none reported Are you sexually active?: Yes What is your sexual orientation?: hetrosexual Has your sexual activity been affected by drugs, alcohol, medication, or emotional stress?: none reported Does patient have children?: Yes How many children?: 3 How is patient's relationship with their children?: Great  Childhood History:  Childhood History By whom was/is the patient raised?: Both parents Description of patient's relationship with caregiver when they were a child: "it was fine" Patient's description of current relationship  with people who raised him/her: "It is good" How were you disciplined when you got in trouble as a child/adolescent?: "punishment. Whoopings." Does patient have siblings?: Yes Number of Siblings: 2 Description of patient's current relationship with siblings: "great" Did patient suffer any verbal/emotional/physical/sexual abuse as a child?: No Did patient suffer from severe childhood neglect?: No Has patient ever been sexually abused/assaulted/raped as an adolescent or adult?: No Was the patient ever a victim of a crime or a disaster?: No Witnessed domestic violence?: No Has patient been affected by domestic violence as an adult?:  No  Child/Adolescent Assessment:     CCA Substance Use Alcohol/Drug Use: Alcohol / Drug Use History of alcohol / drug use?: No history of alcohol / drug abuse   DSM5 Diagnoses: Patient Active Problem List   Diagnosis Date Noted   History of hysterectomy leaving cervix intact 01/04/2022   Anemia affecting pregnancy, antepartum 11/14/2021   ASCUS with positive high risk HPV cervical on 04/05/2021 04/10/2021   Alpha thalassemia silent carrier 05/27/2018        Collaboration of Care: Other referral to individual therapy.  Patient/Guardian was advised Release of Information must be obtained prior to any record release in order to collaborate their care with an outside provider. Patient/Guardian was advised if they have not already done so to contact the registration department to sign all necessary forms in order for [place] to release information regarding their care.   Consent: Patient/Guardian gives verbal consent for treatment and assignment of benefits for services provided during this visit. Patient/Guardian expressed understanding and agreed to proceed.   Shirley Cooks, LCSW

## 2022-12-27 ENCOUNTER — Encounter: Payer: Self-pay | Admitting: Family Medicine

## 2022-12-27 ENCOUNTER — Ambulatory Visit (INDEPENDENT_AMBULATORY_CARE_PROVIDER_SITE_OTHER): Payer: Medicaid Other | Admitting: Family Medicine

## 2022-12-27 VITALS — BP 108/82 | Ht 60.0 in | Wt 196.0 lb

## 2022-12-27 DIAGNOSIS — M654 Radial styloid tenosynovitis [de Quervain]: Secondary | ICD-10-CM

## 2022-12-27 MED ORDER — PREDNISONE 10 MG PO TABS: ORAL_TABLET | ORAL | 0 refills | Status: DC

## 2022-12-27 NOTE — Patient Instructions (Signed)
Take prednisone dose pack x 6 days. Don't take meloxicam or aleve while on it (ok to take the prednisone today though even if you took meloxicam). Icing 15 minutes at a time as needed. Keep using the brace. Keep appointment with Dr. Fara Boros on the 18th.

## 2022-12-27 NOTE — Progress Notes (Signed)
PCP: Patient, No Pcp Per  Subjective:   HPI: Patient is a 27 y.o. female here for right wrist pain.  11/18: Previously seen 10/21 and given a right first dorsal compartment injection.  Instructed to continue meloxicam as needed.  She states that the injection helped for about 2 weeks, but the pain has come back.  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  12/11: Patient will be seeing hand surgeon in 1 week but pain has been pretty severe radial side of wrist past week. Taking meloxicam and using thumb spica brace. No new injuries. No numbness.  Past Medical History:  Diagnosis Date   Alpha thalassemia silent carrier 05/27/2018   Anemia    Cervical dysplasia 02/15/2017   02/2018: pap neg   Chlamydia    GDM (gestational diabetes mellitus), class A1 07/11/2021   Infection    UTI   Sciatica 07/16/2018    Current Outpatient Medications on File Prior to Visit  Medication Sig Dispense Refill   meloxicam (MOBIC) 15 MG tablet Take 1 tablet (15 mg total) by mouth daily. 30 tablet 0   Accu-Chek Softclix Lancets lancets 1 each by Other route 4 (four) times daily. (Patient not taking: Reported on 10/26/2022) 100 each 12   Blood Glucose Monitoring Suppl (ACCU-CHEK GUIDE) w/Device KIT 1 Device by Does not apply route 4 (four) times daily. (Patient not taking: Reported on 10/26/2022) 1 kit 0   Continuous Blood Gluc Sensor (FREESTYLE LIBRE 3 SENSOR) MISC 1 Package by Does not apply route as directed. Place 1 sensor on the skin every 14 days. Use to check glucose continuously (Patient not taking: Reported on 10/26/2022) 2 each 3   Continuous Blood Gluc Transmit (DEXCOM G6 TRANSMITTER) MISC 1 Package by Does not apply route every 3 (three) months. (Patient not taking: Reported on 10/26/2022) 1 each 2   dicloxacillin (DYNAPEN) 500 MG capsule Take 1 capsule (500 mg total) by mouth 4 (four) times daily. (Patient not taking: Reported on 10/26/2022) 40  capsule 0   glucose blood (ACCU-CHEK GUIDE) test strip Use to check blood sugars four times a day was instructed (Patient not taking: Reported on 10/26/2022) 50 each 12   sertraline (ZOLOFT) 25 MG tablet Take 1 tablet (25 mg total) by mouth daily. 30 tablet 3   No current facility-administered medications on file prior to visit.    Past Surgical History:  Procedure Laterality Date   CESAREAN SECTION N/A 04/05/2012   Procedure: CESAREAN SECTION;  Surgeon: Tilda Burrow, MD;  Location: [facility];  Service: Obstetrics;  Laterality: N/A;   CESAREAN SECTION WITH BILATERAL TUBAL LIGATION N/A 11/28/2018   Procedure: cesarean section;  Surgeon: Hermina Staggers, MD;  Location: [facility];  Service: Obstetrics;  Laterality: N/A;   COLPOSCOPY  11/28/2022   DILATION AND EVACUATION N/A 11/19/2020   Procedure: DILATATION AND EVACUATION;  Surgeon: Myna Hidalgo, DO;  Location: [facility];  Service: Gynecology;  Laterality: N/A;   OPERATIVE ULTRASOUND N/A 11/19/2020   Procedure: OPERATIVE ULTRASOUND;  Surgeon: Myna Hidalgo, DO;  Location: [facility] OR;  Service: Gynecology;  Laterality: N/A;    No Known Allergies  BP 108/82   Ht 5' (1.524 m)   Wt 196 lb (88.9 kg)   BMI 38.28 kg/m       No data to display              No data to display  Objective:  Physical Exam:  Gen: NAD, comfortable in exam room  Right hand/wrist: No deformity.  Mild swelling over 1st dorsal compartment. Tenderness to palpation 1st dorsal compartment.  No carpal tunnel, 1st CMC tenderness. NVI distally.   Assessment & Plan:  1. Right de Quervain's tenosynovitis - not improving with 1 injection, meloxicam, thumb spica brace.  Has appointment with hand surgeon in 1 week.  Discussed options - start prednisone dose pack x 6 days.  Icing, thumb spica brace.

## 2023-01-03 ENCOUNTER — Other Ambulatory Visit (INDEPENDENT_AMBULATORY_CARE_PROVIDER_SITE_OTHER): Payer: Self-pay

## 2023-01-03 ENCOUNTER — Ambulatory Visit (INDEPENDENT_AMBULATORY_CARE_PROVIDER_SITE_OTHER): Payer: Medicaid Other | Admitting: Orthopedic Surgery

## 2023-01-03 DIAGNOSIS — M654 Radial styloid tenosynovitis [de Quervain]: Secondary | ICD-10-CM | POA: Diagnosis not present

## 2023-01-03 DIAGNOSIS — M25531 Pain in right wrist: Secondary | ICD-10-CM

## 2023-01-03 NOTE — Progress Notes (Signed)
[name] - 27 y.o. female MRN [id number]  Date of birth: May 17, 1995  Office Visit Note: Visit Date: 01/03/2023 PCP: Patient, No Pcp Per Referred by: Lenda Kelp, MD  Subjective: No chief complaint on file.  HPI: Shirley Greene is a pleasant 27 y.o. female who presents today for evaluation of long long right wrist radial sided pain does been present now for greater than 3 months.  She states that the pain is worsening in nature.  She did undergo an injection in October of this year the right wrist first extensor compartment with mild relief, which only lasted approximately 1 week or so.  She is also been utilizing a brace which does include the thumb.  Her symptoms at this point are refractory to these conservative treatments.  Pertinent ROS were reviewed with the patient and found to be negative unless otherwise specified above in HPI.   Visit Reason: right radial sided wrist pain, de Quervain's tenosynovitis Duration of symptoms: 3+ months Hand dominance: right Occupation:  Diabetic: Yes Smoking: No Heart/Lung History: none Blood Thinners:  none  Prior Testing/EMG: [place] Injections (Date):  11/06/22 Treatments: injection, brace Prior Surgery: none  Assessment & Plan: Visit Diagnoses:  1. Pain in right wrist     Plan: Extensive discussion was had the patient today regarding her right radial sided wrist pain.  Her examination is consistent with ongoing right wrist de Quervain's tenosynovitis.  We discussed etiology and pathophysiology of this condition as well as the conservative versus surgical treatment algorithm.  From a conservative standpoint, she has trialed bracing extensively, previous injection as well as activity modification without lasting relief.  We discussed ongoing use of anti-inflammatory medication, activity modification and bracing with possible repeat injection.  However, given that she has not experienced much relief from these prior treatments,  she is more interested in surgical intervention which I am in agreement with.  At this juncture, she is indicated for right wrist for extensor compartment release.  Risks and benefits of the procedure were discussed, risks including but not limited to infection, bleeding, scarring, stiffness, nerve injury, tendon injury, vascular injury, possible tendon instability, recurrence of symptoms and need for subsequent operation.  Patient expressed understanding.  Will move forward with surgical scheduling at her convenience.  Surgery will be performed under IV sedation at her request.    Follow-up: No follow-ups on file.   Meds & Orders: No orders of the defined types were placed in this encounter.   Orders Placed This Encounter  Procedures   XR Wrist Complete Right     Procedures: No procedures performed      Clinical History: No specialty comments available.  She reports that she has never smoked. She has never used smokeless tobacco. No results for input(s): "HGBA1C", "LABURIC" in the last 8760 hours.  Objective:   Vital Signs: There were no vitals taken for this visit.  Physical Exam  Gen: Well-appearing, in no acute distress; non-toxic CV: Regular Rate. Well-perfused. Warm.  Resp: Breathing unlabored on room air; no wheezing. Psych: Fluid speech in conversation; appropriate affect; normal thought process  Ortho Exam General: Patient is well appearing and in no distress. Cervical spine mobility is full in all directions:  Skin and Muscle: No skin changes are apparent to upper extremities.  Muscle bulk and contour normal, no signs of atrophy.     Range of Motion and Palpation Tests: Mobility is full about the elbows with flexion and extension.  Forearm supination and  pronation are 85/85 bilaterally.  Wrist flexion/extension is 75/65 left side, wrist flexion/extension limited secondary to pain right side, approximately 45/45.  Digital flexion and extension are full.    No cords  or nodules are palpated.  No triggering is observed.   Finklestein test is positive right side, significant pain with associated swelling and tenderness.    Neurologic, Vascular, Motor: Sensation is intact to light touch in the median/radial/ulnar distributions.   Fingers pink and well perfused.  Capillary refill is brisk.     Imaging: XR Wrist Complete Right Result Date: 01/03/2023 X-rays of the right wrist, multiple views were obtained X-rays demonstrate stable appearance of the radiocarpal and midcarpal articulations without significant bony abnormalities.  Bone mineralization is appropriate.   Past Medical/Family/Surgical/Social History: Medications & Allergies reviewed per EMR, new medications updated. Patient Active Problem List   Diagnosis Date Noted   Current moderate episode of major depressive disorder without prior episode (HCC) 12/19/2022   History of hysterectomy leaving cervix intact 01/04/2022   Anemia affecting pregnancy, antepartum 11/14/2021   ASCUS with positive high risk HPV cervical on 04/05/2021 04/10/2021   Alpha thalassemia silent carrier 05/27/2018   Past Medical History:  Diagnosis Date   Alpha thalassemia silent carrier 05/27/2018   Anemia    Cervical dysplasia 02/15/2017   02/2018: pap neg   Chlamydia    GDM (gestational diabetes mellitus), class A1 07/11/2021   Infection    UTI   Sciatica 07/16/2018   Family History  Problem Relation Age of Onset   Hypertension Mother    Hypertension Father    Cancer Maternal Grandfather    Past Surgical History:  Procedure Laterality Date   CESAREAN SECTION N/A 04/05/2012   Procedure: CESAREAN SECTION;  Surgeon: Tilda Burrow, MD;  Location: [facility];  Service: Obstetrics;  Laterality: N/A;   CESAREAN SECTION WITH BILATERAL TUBAL LIGATION N/A 11/28/2018   Procedure: cesarean section;  Surgeon: Hermina Staggers, MD;  Location: [facility];  Service: Obstetrics;  Laterality: N/A;   COLPOSCOPY  11/28/2022    DILATION AND EVACUATION N/A 11/19/2020   Procedure: DILATATION AND EVACUATION;  Surgeon: Myna Hidalgo, DO;  Location: [facility] OR;  Service: Gynecology;  Laterality: N/A;   OPERATIVE ULTRASOUND N/A 11/19/2020   Procedure: OPERATIVE ULTRASOUND;  Surgeon: Myna Hidalgo, DO;  Location: [facility];  Service: Gynecology;  Laterality: N/A;   Social History   Occupational History   Not on file  Tobacco Use   Smoking status: Never   Smokeless tobacco: Never  Vaping Use   Vaping status: Never Used  Substance and Sexual Activity   Alcohol use: Not Currently    Comment: twice a month   Drug use: No   Sexual activity: Not Currently    Birth control/protection: None    Devell Parkerson Fara Boros) Denese Killings, M.D. [facility] 10:33 AM

## 2023-01-09 ENCOUNTER — Ambulatory Visit (HOSPITAL_COMMUNITY): Payer: Self-pay | Admitting: Licensed Clinical Social Worker

## 2023-02-08 NOTE — Progress Notes (Signed)
Psychiatric Initial Adult Assessment  Patient Identification: Shirley Greene MRN:  [id number] Date of Evaluation:  02/09/2023 Referral Source: Richardson Dopp LCSW   Assessment:  Shirley Greene is a 28 [id number] female with no formal psychiatric history and medication history of iron deficiency anemia, gestational diabetes who presents to [facility] via video conferencing for initial evaluation of depression.  Patient denies prior psychiatric history until 2 months postpartum (s/p delivery Nov 2023) following distressing hospital course requiring emergent C section at 33 weeks, NICU stay, and hysterectomy. She reports that being away from 2 older children elicited significant anxiety and since has felt heightened sense of anxiety when separated from children. Fortunately, she reports that she and baby boy are doing well physically. In the months subsequent to these events, she endorses onset of low mood, decreased energy, feelings of guilt, easy overwhelm, and disruption to sleep and appetite. Denies SI/HI and no acute safety concerns. She states these symptoms have been gradually improving over time especially as middle child has started preschool. Her main complaint today is loss of sense of self, feeling that majority of her time and energy is invested in others with little time for self care. Upon further exploration of patient's current schedule, it does appear there are areas in which time could be protected to devote to self-care and healthier boundaries could be drawn between home/work. She opts to focus on psychotherapy and behavioral changes for time being and reassess indication for medications at next visit. In the meantime, will obtain updated labs to rule out medical contributors to changes in mood.  RTC in approx. 2 months by video.  Plan:  # Postpartum depression, currently mild Past medication trials: none Status of problem: new problem to this  provider Interventions: -- Patient did not start Zoloft 25 mg daily as previously prescribed by Ob/Gyn; will not start at this time -- Patient opts to focus on psychotherapy for time being and reassess need for medications at next visit -- R/o medical contributors: CBC, CMP, TSH, Vitamin D ordered -- Continue individual psychotherapy with Richardson Dopp LCSW -- Encouraged patient to discuss with family areas in which they could provide childcare support to allow patient time for self-care; discussed option of working outside the home when possible to minimize disturbances from kids -- Patient is no longer breastfeeding  Patient was given contact information for behavioral health clinic and was instructed to call 911 for emergencies.   Subjective:  Chief Complaint:  Chief Complaint  Patient presents with   Medication Management    History of Present Illness:    Chart review: -- Referred by Ob/Gyn October 2024 for anxiety and depression beginning s/p delivery 11/29/21. Zoloft 25 mg daily started.  -- CCA by Richardson Dopp LCSW 12/19/22: diagnoses felt c/w postpartum depression, GAD. Did not start Zoloft as prescribed by Ob/Gyn due to concern for side effects and dependence.   Reports that since having her baby on 11/29/21 she has been experiencing postpartum depression. Ob/gyn recommended medication; she did not start it as she wanted to try to handle it herself. Has 3 kids (30 yo, 41 yo, 1 yo); boyfriend (who is father of 1 yo) lives in the home. She feels safe in the home. Pregnancy with youngest was "rough" physically and had a lot of complications; he was born at 13 weeks and she was admitted to the hospital 2 weeks prior for placenta previa and velamentous umbilical cord. She ended up requiring a hysterectomy. Already knew she  didn't want more kids but was a shock feeling forced into the procedure. Had to leave kids with family while she was in the hospital. 30 yo at the time was forced to  go into daycare and had trouble with the transition. Baby was in the NICU for 2 weeks and she had to return home to take care of kids.   Now, she and baby are doing well physically. She is no longer breastfeeding; breastfed until 7 months.   Denies history of depression predating pregnancy or during pregnancy. Onset began about 2 months after delivery in which she felt "stuck," experiencing easy overwhelm, feelings of guilt, difficulty keeping up with household responsibilities, decline in self care (although has kept up with regular showering and grooming). Reports increased anxiety surrounding kids and now feels she needs to know where her kids are and what they're doing; this began after being away from her kids for 2 weeks when she was in the hospital. Feels she overcompensates every day with her kids because the dads for her 2 oldest aren't around.   Reports intact interest in activities and motivation; however reports feeling tired with low energy. Sleeping about 5-6 hours; feels she functions well at 7 hours. Reports trouble staying asleep with 1-2 nighttime awakenings. Denies trouble falling asleep. Reports more vivid dreams lately; content not related to kids or hospitalization. Denies recurrent intrusive memories to hospitalization, hyperarousal symptoms, or hypervigilance.   Appetite has been a bit low for the past 6 months; denies any weight loss. Eats about 2 meals a day. Denies ever reaching place of SI or HI/thoughts of harm towards baby. Denies AVH; history of hypomania/mania.   Reports that all her time is devoted to taking care of kids - "There's no room for me to be me - I don't feel like a person - I'm the mother, girlfriend, the daughter but not Shirley Greene." She does feel this has been gradually improving however now that her 30 yo is in preschool.   She works on the weekend as a Associate Professor; self employed. Clients come to her and she often watches the kids while working. Prior to baby,  she worked outside the home and found this a more helpful balance. She does feel it would be possible to implement more strict work/life boundaries and work outside the home.   She identifies if she had extra time in the day her forms of self care would be: alone showers in which she has time to do a full wash; occasional nap; having moments of solitude; walking around a shopping center by herself  Medical conditions: -- Prediabetes, gestational diabetes  Diagnostic conceptualization discussed including diagnosis of postpartum depression currently mild. Treatment options reviewed including therapy and/or medications. She states she would like to remain off medications if possible as she does not want to become dependent on a medication. Discussed mechanism of action of medications, biologic and environmental contributors to depression, and provided normalization of use of medications for depression if warranted in the future. She opts to focus on psychotherapy for time being and reassess need for medications in 2 months.   Past Psychiatric History:  Diagnoses: recent diagnosis of postpartum depression Medication trials: denies Previous psychiatrist/therapist: denies Hospitalizations: denies Suicide attempts: denies SIB: denies Hx of violence towards others: denies Current access to guns: denies Hx of trauma/abuse: denies  Previous Psychotropic Medications: No   Substance Abuse History in the last 12 months:  No.  -- Etoh: every few months; approx. 1-2 drinks in  a sitting  -- Tobacco: denies  -- Cannabis/CBD/THC: denies  -- Illicit drugs: denies  Past Medical History:  Past Medical History:  Diagnosis Date   Alpha thalassemia silent carrier 05/27/2018   Anemia    Cervical dysplasia 02/15/2017   02/2018: pap neg   Chlamydia    GDM (gestational diabetes mellitus), class A1 07/11/2021   Infection    UTI   Postpartum depression    Sciatica 07/16/2018    Past Surgical History:   Procedure Laterality Date   CESAREAN SECTION N/A 04/05/2012   Procedure: CESAREAN SECTION;  Surgeon: Tilda Burrow, MD;  Location: [facility];  Service: Obstetrics;  Laterality: N/A;   CESAREAN SECTION WITH BILATERAL TUBAL LIGATION N/A 11/28/2018   Procedure: cesarean section;  Surgeon: Hermina Staggers, MD;  [facility]: [facility];  Service: Obstetrics;  Laterality: N/A;   COLPOSCOPY  11/28/2022   DILATION AND EVACUATION N/A 11/19/2020   Procedure: DILATATION AND EVACUATION;  Surgeon: Myna Hidalgo, DO;  Location: [facility];  Service: Gynecology;  Laterality: N/A;   OPERATIVE ULTRASOUND N/A 11/19/2020   Procedure: OPERATIVE ULTRASOUND;  Surgeon: Myna Hidalgo, DO;  Location: [facility];  Service: Gynecology;  Laterality: N/A;    Family Psychiatric History: denies  Family History:  Family History  Problem Relation Age of Onset   Hypertension Mother    Hypertension Father    Cancer Maternal Grandfather     Social History:   Academic/Vocational: completed high school and cosmetology school; self-employed as Associate Professor  Social History   Socioeconomic History   Marital status: Single    Spouse name: Not on file   Number of children: Not on file   Years of education: Not on file   Highest education level: Not on file  Occupational History   Not on file  Tobacco Use   Smoking status: Never   Smokeless tobacco: Never  Vaping Use   Vaping status: Never Used  Substance and Sexual Activity   Alcohol use: Yes    Comment: every few months   Drug use: No   Sexual activity: Not Currently    Birth control/protection: None  Other Topics Concern   Not on file  Social History Narrative   Not on file   Social Drivers of Health   Financial Resource Strain: Low Risk  (12/19/2022)   Overall Financial Resource Strain (CARDIA)    Difficulty of Paying Living Expenses: Not hard at all  Food Insecurity: No Food Insecurity (12/19/2022)   Hunger Vital Sign    Worried About Running Out of Food in the  Last Year: Never true    Ran Out of Food in the Last Year: Never true  Transportation Needs: No Transportation Needs (12/19/2022)   PRAPARE - Administrator, Civil Service (Medical): No    Lack of Transportation (Non-Medical): No  Physical Activity: Inactive (12/19/2022)   Exercise Vital Sign    Days of Exercise per Week: 0 days    Minutes of Exercise per Session: 0 min  Stress: Stress Concern Present (12/19/2022)   [employer] of Occupational Health - Occupational Stress Questionnaire    Feeling of Stress : To some extent  Social Connections: Moderately Isolated (12/19/2022)   Social Connection and Isolation Panel [NHANES]    Frequency of Communication with Friends and Family: More than three times a week    Frequency of Social Gatherings with Friends and Family: Once a week    Attends Religious Services: Never    Production manager of  Clubs or Organizations: No    Attends Banker Meetings: Never    Marital Status: Living with partner    Additional Social History: updated  Allergies:  No Known Allergies  Current Medications: Current Outpatient Medications  Medication Sig Dispense Refill   meloxicam (MOBIC) 15 MG tablet Take 1 tablet (15 mg total) by mouth daily. 30 tablet 0   No current facility-administered medications for this visit.    ROS: Does not endorse any physical complaints  Objective:  Psychiatric Specialty Exam: currently breastfeeding.There is no height or weight on file to calculate BMI.  General Appearance: Casual and Well Groomed  Eye Contact:  Good  Speech:  Clear and Coherent and Normal Rate  Volume:  Normal  Mood:   "overwhelmed"  Affect:   Euthymic; calm; pleasant  Thought Content:  Denies AVH; no delusional thought content on interview    Suicidal Thoughts:  No  Homicidal Thoughts:  No  Thought Process:  Goal Directed and Linear  Orientation:  Full (Time, Place, and Person)    Memory: Grossly intact  Judgment:  Good   Insight:  Good  Concentration:  Concentration: Good  Recall:  not formally assessed  Fund of Knowledge: Good  Language: Good  Psychomotor Activity:  Normal  Akathisia:  NA  AIMS (if indicated): NA  Assets:  Communication Skills Desire for Improvement Housing Intimacy Physical Health Resilience Social Support Talents/Skills Transportation Vocational/Educational  ADL's:  Intact  Cognition: WNL  Sleep:  Fair   PE: General: sits comfortably in view of camera; no acute distress  Pulm: no increased work of breathing on room air  MSK: all extremity movements appear intact  Neuro: no focal neurological deficits observed  Gait & Station: unable to assess by video    Metabolic Disorder Labs: Lab Results  Component Value Date   HGBA1C 5.9 (H) 06/30/2021   No results found for: "PROLACTIN" No results found for: "CHOL", "TRIG", "HDL", "CHOLHDL", "VLDL", "LDLCALC" Lab Results  Component Value Date   TSH 3.290 06/30/2021    Therapeutic Level Labs: No results found for: "LITHIUM" No results found for: "CBMZ" No results found for: "VALPROATE"  Screenings:  GAD-7    Flowsheet Row Counselor from 12/19/2022 in [facility]  Total GAD-7 Score 10      PHQ2-9    Flowsheet Row Counselor from 12/19/2022 in [facility] Nutrition from 07/27/2021 in [facility] Nut[facility] Diab Ed  - A Dept Of [place]  PHQ-2 Total Score 2 0  PHQ-9 Total Score 7 --      Flowsheet Row Counselor from 12/19/2022 in [facility] Admission (Discharged) from 11/17/2021 in [place] 1S Maternity Assessment Unit Admission (Discharged) from 09/20/2021 in [facility] 1S Maternity Assessment Unit  C-SSRS RISK CATEGORY No Risk No Risk No Risk       Collaboration of Care: Collaboration of Care: Psychiatrist AEB established with this provider and Referral or follow-up with counselor/therapist AEB established with individual  psychotherapy  Patient/Guardian was advised Release of Information must be obtained prior to any record release in order to collaborate their care with an outside provider. Patient/Guardian was advised if they have not already done so to contact the registration department to sign all necessary forms in order for [place] to release information regarding their care.   Consent: Patient/Guardian gives verbal consent for treatment and assignment of benefits for services provided during this visit. Patient/Guardian expressed understanding and agreed to proceed.  Televisit via video: I connected with Briannia O Huizar on 02/09/23 at 10:00 AM EST by a video enabled telemedicine application and verified that I am speaking with the correct person using two identifiers.  Location: Patient: home address in [place] Provider: remote office in [place]   I discussed the limitations of evaluation and management by telemedicine and the availability of in person appointments. The patient expressed understanding and agreed to proceed.  I discussed the assessment and treatment plan with the patient. The patient was provided an opportunity to ask questions and all were answered. The patient agreed with the plan and demonstrated an understanding of the instructions.   The patient was advised to call back or seek an in-person evaluation if the symptoms worsen or if the condition fails to improve as anticipated.  I provided 70 minutes dedicated to the care of this patient via video on the date of this encounter to include chart review, face-to-face time with the patient, overview of medication options, brief therapeutic support.  Michael Walrath A Linnea Todisco 1/24/20251:19 PM

## 2023-02-09 ENCOUNTER — Encounter (HOSPITAL_COMMUNITY): Payer: Self-pay | Admitting: Psychiatry

## 2023-02-09 ENCOUNTER — Ambulatory Visit (HOSPITAL_COMMUNITY): Payer: Medicaid Other | Admitting: Psychiatry

## 2023-02-09 DIAGNOSIS — F329 Major depressive disorder, single episode, unspecified: Secondary | ICD-10-CM | POA: Diagnosis not present

## 2023-02-09 DIAGNOSIS — F53 Postpartum depression: Secondary | ICD-10-CM | POA: Insufficient documentation

## 2023-02-09 NOTE — Patient Instructions (Signed)
Thank you for attending your appointment today.  -- We did not start any medications today and as discussed will first focus on therapy and reassess need for medications at our next appointment.   Please do not make any changes to medications without first discussing with your provider. If you are experiencing a psychiatric emergency, please call 911 or present to your nearest emergency department. Additional crisis, medication management, and therapy resources are included below.  [facility]  [address] [place], [place] [address] [phone] WALK-IN URGENT CARE 24/7 FOR ANYONE [address] [place], [place]  [phone] Fax: [phone] [email or web address] *Interpreters available *Accepts all insurance and uninsured for Urgent Care needs *Accepts Medicaid and uninsured for outpatient treatment (below)      ONLY FOR [facility]  Below:    Outpatient New Patient Assessment/Therapy Walk-ins:        Monday, Wednesday, and Thursday 8am until slots are full (first come, first served)                   New Patient Psychiatry/Medication Management        Monday-Friday 8am-11am (first come, first served)               For all walk-ins we ask that you arrive by 7:15am, because patients will be seen in the order of arrival.

## 2023-02-12 ENCOUNTER — Ambulatory Visit (INDEPENDENT_AMBULATORY_CARE_PROVIDER_SITE_OTHER): Payer: Medicaid Other | Admitting: Licensed Clinical Social Worker

## 2023-02-12 DIAGNOSIS — F53 Postpartum depression: Secondary | ICD-10-CM

## 2023-02-12 NOTE — Progress Notes (Signed)
THERAPIST PROGRESS NOTE   Virtual Visit via Video Note  I connected with Shirley Greene on 02/12/23 at  1:00 PM EST by a video enabled telemedicine application and verified that I am speaking with the correct person using two identifiers.  Location: Patient: [facility]  Provider: Providers Home    I discussed the limitations of evaluation and management by telemedicine and the availability of in person appointments. The patient expressed understanding and agreed to proceed.     I discussed the assessment and treatment plan with the patient. The patient was provided an opportunity to ask questions and all were answered. The patient agreed with the plan and demonstrated an understanding of the instructions.   The patient was advised to call back or seek an in-person evaluation if the symptoms worsen or if the condition fails to improve as anticipated.  I provided 55 minutes of non-face-to-face time during this encounter.   Weber Cooks, LCSW   Participation Level: Active  Behavioral Response: CasualAlertAnxious and Depressed  Type of Therapy: Individual Therapy  Treatment Goals addressed:  Active     Anxiety     STG: Shirley Greene will complete at least 80% of assigned homework      Start:  12/19/22    Expected End:  07/20/23         STG: Shirley Greene will practice problem solving skills 3 times per week for the next 4 weeks.      Start:  12/19/22    Expected End:  07/20/23         Create 3 coping skills for anxiety     Start:  12/19/22    Expected End:  07/20/23         Notify 3 triggers for anxiety (Progressing)     Start:  12/19/22    Expected End:  07/20/23         Work with Shirley Greene to track symptoms, triggers, and/or skill use through a mood chart, diary card, or journal     Start:  12/19/22       Intervention Note     Reviewed with patient during session.          Discuss risks and benefits of medication treatment options for this problem and  prescribe as indicated     Start:  12/19/22       Intervention Note     Reviewed with patient during session.          Encourage Shirley Greene to take psychotropic medication(s) as prescribed     Start:  12/19/22       Intervention Note     Reviewed with patient during session.          Review results of GAD-7 with Shirley Greene to track progress     Start:  12/19/22       Intervention Note     Reviewed with patient during session.          Perform psychoeducation regarding anxiety disorders     Start:  12/19/22       Intervention Note     Reviewed with patient during session.            OP Depression     LTG: Reduce frequency, intensity, and duration of depression symptoms so that daily functioning is improved (Progressing)     Start:  12/19/22    Expected End:  07/20/23         LTG: Increase coping skills to manage depression and  improve ability to perform daily activities (Progressing)     Start:  12/19/22    Expected End:  07/20/23         LTG: Shirley Greene will score less than 10 on the Patient Health Questionnaire (PHQ-9)      Start:  12/19/22    Expected End:  07/20/23         STG: Shirley Greene will participate in at least 80% of scheduled individual psychotherapy sessions  (Progressing)     Start:  12/19/22    Expected End:  07/20/23         Work with Shirley Greene to track symptoms, triggers, and/or skill use through a mood chart, diary card, or journal     Start:  12/19/22       Intervention Note     Reviewed with patient during session.          Encourage Shirley Greene to participate in recovery peer support activities weekly      Start:  12/19/22         Therapist will administer the PHQ-9 at weekly intervals for the next 8 weeks     Start:  12/19/22         Provide Shirley Greene educational information and reading material on dissociation, its causes, and symptoms     Start:  12/19/22         Shirley Greene will identify 3 personal goals for managing depression  symptoms to work on during the current treatment episode     Start:  12/19/22            ProgressTowards Goals: Progressing  Interventions: CBT, Motivational Interviewing, and Supportive   Suicidal/Homicidal: Nowithout intent/plan  Therapist Response:     Patient was alert and oriented x 5. Shirley Greene was pleasant, cooperative, maintained good eye contact.  She presented today with anxious and depressed mood\affect.  She engaged well in therapy session was dressed casually.  Patient comes in today with primary stressors as life, work, family balance.  Patient reports that she is always prioritizing her family over herself.  She feels judged when she asked for help.  Patient reports example of people saying things like "you should have thought of that before having kids" when patient asked for help especially from her friends and family. Shirley Greene reports in order to avoid this anxiety and those situations she just does not ask for help. Patient is at times she does not feel supported by family including her spouse.  Patient reports that often times her boundaries personally are crossed.  She reports that she ha has struggled with saying "no" in the past.     Intervention/Plan:  LCSW utilized cognitive behavioral therapy for cognitive restructuring.  LCSW spoke with patient's on the utilization of a planner to help herself and her family keep more organized.  LCSW utilized example of a Cabin crew".  This is encountered working on the house that people can add things that they need done for the day this includes her children and her spouse.  LCSW educated patient on setting healthy boundaries such as "normalizing now".  LCSW utilized supportive therapy for praise and encouragement.  LCSW utilized psycho analytic therapy for patient to express thoughts, feelings, and concerns and nonjudgmental environment.  Plan: Return again in 3 weeks.  Diagnosis: Postpartum depression  Collaboration of  Care: Other None today   Patient/Guardian was advised Release of Information must be obtained prior to any record release in order to collaborate their care with an outside provider.  Patient/Guardian was advised if they have not already done so to contact the registration department to sign all necessary forms in order for [place] to release information regarding their care.   Consent: Patient/Guardian gives verbal consent for treatment and assignment of benefits for services provided during this visit. Patient/Guardian expressed understanding and agreed to proceed.   Weber Cooks, LCSW 02/12/2023

## 2023-02-26 ENCOUNTER — Other Ambulatory Visit (HOSPITAL_COMMUNITY): Payer: Medicaid Other

## 2023-03-05 ENCOUNTER — Ambulatory Visit (HOSPITAL_COMMUNITY): Payer: Medicaid Other | Admitting: Licensed Clinical Social Worker

## 2023-03-06 ENCOUNTER — Ambulatory Visit (INDEPENDENT_AMBULATORY_CARE_PROVIDER_SITE_OTHER): Payer: Medicaid Other | Admitting: Licensed Clinical Social Worker

## 2023-03-06 DIAGNOSIS — F321 Major depressive disorder, single episode, moderate: Secondary | ICD-10-CM

## 2023-03-06 NOTE — Progress Notes (Signed)
THERAPIST PROGRESS NOTE  Virtual Visit via Video Note  I connected with Shirley Greene on 03/06/23 at  1:00 PM EST by a video enabled telemedicine application and verified that I am speaking with the correct person using two identifiers.  Location: Patient: [facility]  Provider: Providers Home    I discussed the limitations of evaluation and management by telemedicine and the availability of in person appointments. The patient expressed understanding and agreed to proceed.    I discussed the assessment and treatment plan with the patient. The patient was provided an opportunity to ask questions and all were answered. The patient agreed with the plan and demonstrated an understanding of the instructions.   The patient was advised to call back or seek an in-person evaluation if the symptoms worsen or if the condition fails to improve as anticipated.  I provided 45 minutes of non-face-to-face time during this encounter.   Shirley Cooks, LCSW   Participation Level: Active  Behavioral Response: CasualAlertAnxious and Depressed  Type of Therapy: Individual Therapy  Treatment Goals addressed:  Active     Anxiety     STG: Shirley Greene will complete at least 80% of assigned homework  (Progressing)     Start:  12/19/22    Expected End:  07/20/23         STG: Shirley Greene will practice problem solving skills 3 times per week for the next 4 weeks.  (Progressing)     Start:  12/19/22    Expected End:  07/20/23         Create 3 coping skills for anxiety     Start:  12/19/22    Expected End:  07/20/23         Notify 3 triggers for anxiety (Progressing)     Start:  12/19/22    Expected End:  07/20/23         Work with Shirley Greene to track symptoms, triggers, and/or skill use through a mood chart, diary card, or journal     Start:  12/19/22       Intervention Note     Reviewed with patient during session.          Discuss risks and benefits of medication treatment options  for this problem and prescribe as indicated     Start:  12/19/22       Intervention Note     Reviewed with patient during session.          Encourage Shirley Greene to take psychotropic medication(s) as prescribed     Start:  12/19/22       Intervention Note     Reviewed with patient during session.          Review results of GAD-7 with Shirley Greene to track progress     Start:  12/19/22       Intervention Note     Reviewed with patient during session.          Perform psychoeducation regarding anxiety disorders     Start:  12/19/22       Intervention Note     Reviewed with patient during session.            OP Depression     LTG: Reduce frequency, intensity, and duration of depression symptoms so that daily functioning is improved (Progressing)     Start:  12/19/22    Expected End:  07/20/23         LTG: Increase coping skills to manage depression and  improve ability to perform daily activities (Progressing)     Start:  12/19/22    Expected End:  07/20/23         LTG: Shirley Greene will score less than 10 on the Patient Health Questionnaire (PHQ-9)      Start:  12/19/22    Expected End:  07/20/23         STG: Shirley Greene will participate in at least 80% of scheduled individual psychotherapy sessions  (Progressing)     Start:  12/19/22    Expected End:  07/20/23         Work with Shirley Greene to track symptoms, triggers, and/or skill use through a mood chart, diary card, or journal     Start:  12/19/22       Intervention Note     Reviewed with patient during session.          Encourage Shirley Greene to participate in recovery peer support activities weekly      Start:  12/19/22         Therapist will administer the PHQ-9 at weekly intervals for the next 8 weeks     Start:  12/19/22         Provide Shirley Greene educational information and reading material on dissociation, its causes, and symptoms     Start:  12/19/22         Shirley Greene will identify 3 personal goals for  managing depression symptoms to work on during the current treatment episode     Start:  12/19/22            ProgressTowards Goals: Progressing  Interventions: CBT, Motivational Interviewing, and Supportive  Suicidal/Homicidal: Nowithout intent/plan  Therapist Response:   Patient was alert and oriented x 5.  She was pleasant, cooperative, maintained good eye contact.  Patient presented today with depressed and anxious mood\affect.  She was dressed casually and engaged well in therapy session.  Shirley Greene today that her primary stressors as housing and self-care.  She Greene struggling with the recent move of moving to a bigger place.  She Greene overall she was excited about the move but was not excited about packing or the actual move itself.  She Greene now that they are moved in she sees the benefits as it is a lot more her and her family.  Shirley Greene struggling with setting healthy boundaries.  She Greene that this is due to her lack of direct communication.  She Greene that because she is always letting boundaries get crossed she is struggling with her self-care.  Patient Greene lack of motivation as she Greene that she is consistently giving to her friends and family rather than to herself.  Intervention/plan: LCSW utilized psycho analytic therapy for patient to express thoughts, feelings, and concerns and nonjudgmental environment.  LCSW utilized supportive therapy for praise and encouragement.  LCSW educated patient on setting healthy boundaries such as knowing her limits.  LCSW educated patient on the benefits of exercise both for physical and mental health.  LCSW educated patient on proper self-care and designated certain times of the day for being able to take time to herself.    Plan: Return again in 3 weeks.  Diagnosis: Current moderate episode of major depressive disorder without prior episode (HCC)  Collaboration of Care: Other None today   Patient/Guardian  was advised Release of Information must be obtained prior to any record release in order to collaborate their care with an outside provider. Patient/Guardian was advised if they have not already  done so to contact the registration department to sign all necessary forms in order for [place] to release information regarding their care.   Consent: Patient/Guardian gives verbal consent for treatment and assignment of benefits for services provided during this visit. Patient/Guardian expressed understanding and agreed to proceed.   Shirley Cooks, LCSW 03/06/2023

## 2023-03-12 ENCOUNTER — Other Ambulatory Visit: Payer: Self-pay

## 2023-03-12 ENCOUNTER — Other Ambulatory Visit (INDEPENDENT_AMBULATORY_CARE_PROVIDER_SITE_OTHER): Payer: Medicaid Other

## 2023-03-12 ENCOUNTER — Ambulatory Visit
Admission: RE | Admit: 2023-03-12 | Discharge: 2023-03-12 | Disposition: A | Discharge status: Home / Self Care | Payer: Medicaid Other | Source: Ambulatory Visit | Attending: Physician Assistant | Admitting: Physician Assistant

## 2023-03-12 VITALS — BP 119/84 | HR 99 | Temp 98.0°F | Resp 16

## 2023-03-12 DIAGNOSIS — R6883 Chills (without fever): Secondary | ICD-10-CM | POA: Diagnosis not present

## 2023-03-12 DIAGNOSIS — R110 Nausea: Secondary | ICD-10-CM | POA: Diagnosis not present

## 2023-03-12 DIAGNOSIS — F53 Postpartum depression: Secondary | ICD-10-CM

## 2023-03-12 DIAGNOSIS — Z79899 Other long term (current) drug therapy: Secondary | ICD-10-CM | POA: Diagnosis not present

## 2023-03-12 DIAGNOSIS — R1010 Upper abdominal pain, unspecified: Secondary | ICD-10-CM | POA: Diagnosis not present

## 2023-03-12 DIAGNOSIS — F411 Generalized anxiety disorder: Secondary | ICD-10-CM

## 2023-03-12 DIAGNOSIS — B349 Viral infection, unspecified: Secondary | ICD-10-CM | POA: Diagnosis not present

## 2023-03-12 DIAGNOSIS — F321 Major depressive disorder, single episode, moderate: Secondary | ICD-10-CM

## 2023-03-12 LAB — POCT URINALYSIS DIP (MANUAL ENTRY)
Bilirubin, UA: NEGATIVE
Blood, UA: NEGATIVE
Glucose, UA: NEGATIVE mg/dL
Ketones, POC UA: NEGATIVE mg/dL
Leukocytes, UA: NEGATIVE
Nitrite, UA: NEGATIVE
Protein Ur, POC: NEGATIVE mg/dL
Spec Grav, UA: 1.02 (ref 1.010–1.025)
Urobilinogen, UA: 1 U/dL
pH, UA: 8.5 — AB (ref 5.0–8.0)

## 2023-03-12 LAB — POCT FASTING CBG KUC MANUAL ENTRY: POCT Glucose (KUC): 99 mg/dL (ref 70–99)

## 2023-03-12 LAB — POCT URINE PREGNANCY: Preg Test, Ur: NEGATIVE

## 2023-03-12 MED ORDER — ONDANSETRON 4 MG PO TBDP
4.0000 mg | ORAL_TABLET | Freq: Once | ORAL | Status: AC
  Administered 2023-03-12: 4 mg via ORAL

## 2023-03-12 MED ORDER — ONDANSETRON 4 MG PO TBDP: 4.0000 mg | ORAL_TABLET | Freq: Three times a day (TID) | ORAL | 0 refills | Status: DC | PRN

## 2023-03-12 MED ORDER — ALUM & MAG HYDROXIDE-SIMETH 200-200-20 MG/5ML PO SUSP
15.0000 mL | Freq: Once | ORAL | Status: AC
  Administered 2023-03-12: 15 mL via ORAL

## 2023-03-12 NOTE — ED Provider Notes (Signed)
 [facility]    CSN: [id number] Arrival date & time: 03/12/23  1746      History   Chief Complaint Chief Complaint  Patient presents with   Hypertension   Headache    HPI Shirley Greene is a 28 y.o. female.   Patient today with a 2-day history of abdominal pain, nausea, chills, headache.  She denies any vomiting, diarrhea, cough, congestion, sore throat, fever.  She had a bowel movement earlier today that was normal and denies any melena or hematochezia.  She denies any suspicious food intake, recent travel, known sick contacts, antibiotic use.  She has been taking Tylenol without improvement of symptoms.  Denies regular NSAID use.  She does not take GLP-1 agonist.  Denies significant alcohol consumption or history of pancreatitis.  Denies previous abdominal surgery and still has gallbladder and appendix.  She denies any significant urinary symptoms including dysuria, frequency, urgency.  She reports abdominal discomfort is rated 5 on a 0-10 pain scale, localized to her upper abdomen, no aggravating relieving factors identified.    Past Medical History:  Diagnosis Date   Alpha thalassemia silent carrier 05/27/2018   Anemia    Cervical dysplasia 02/15/2017   02/2018: pap neg   Chlamydia    GDM (gestational diabetes mellitus), class A1 07/11/2021   Infection    UTI   Postpartum depression    Sciatica 07/16/2018    Patient Active Problem List   Diagnosis Date Noted   Postpartum depression    Current moderate episode of major depressive disorder without prior episode (HCC) 12/19/2022   History of hysterectomy leaving cervix intact 01/04/2022   Anemia affecting pregnancy, antepartum 11/14/2021   ASCUS with positive high risk HPV cervical on 04/05/2021 04/10/2021   Alpha thalassemia silent carrier 05/27/2018    Past Surgical History:  Procedure Laterality Date   CESAREAN SECTION N/A 04/05/2012   Procedure: CESAREAN SECTION;  Surgeon: Tilda Burrow, MD;   Location: [facility];  Service: Obstetrics;  Laterality: N/A;   CESAREAN SECTION WITH BILATERAL TUBAL LIGATION N/A 11/28/2018   Procedure: cesarean section;  Surgeon: Hermina Staggers, MD;  Location: [facility];  Service: Obstetrics;  Laterality: N/A;   COLPOSCOPY  11/28/2022   DILATION AND EVACUATION N/A 11/19/2020   Procedure: DILATATION AND EVACUATION;  Surgeon: Myna Hidalgo, DO;  Location: [facility];  Service: Gynecology;  Laterality: N/A;   OPERATIVE ULTRASOUND N/A 11/19/2020   Procedure: OPERATIVE ULTRASOUND;  Surgeon: Myna Hidalgo, DO;  Location: [facility] OR;  Service: Gynecology;  Laterality: N/A;    OB History     Gravida  4   Para  2   Term  2   Preterm      AB  1   Living  2      SAB      IAB  1   Ectopic      Multiple  0   Live Births  2        Obstetric Comments  "OP" presentation, got stuck at 9cm          Home Medications    Prior to Admission medications   Medication Sig Start Date End Date Taking? Authorizing Provider  ondansetron (ZOFRAN-ODT) 4 MG disintegrating tablet Take 1 tablet (4 mg total) by mouth every 8 (eight) hours as needed for nausea or vomiting. 03/12/23  Yes Leory Allinson, Noberto Retort, PA-C    Family History Family History  Problem Relation Age of Onset   Hypertension Mother  Hypertension Father    Cancer Maternal Grandfather     Social History Social History   Tobacco Use   Smoking status: Never   Smokeless tobacco: Never  Vaping Use   Vaping status: Never Used  Substance Use Topics   Alcohol use: Yes    Comment: every few months   Drug use: No     Allergies   Patient has no known allergies.   Review of Systems Review of Systems  Constitutional:  Positive for activity change, appetite change and chills. Negative for fatigue and fever.  HENT:  Negative for congestion and sore throat.   Respiratory:  Negative for cough and shortness of breath.   Cardiovascular:  Negative for chest pain.  Gastrointestinal:  Positive for  abdominal pain and nausea. Negative for constipation, diarrhea and vomiting.  Genitourinary:  Negative for dysuria, frequency and urgency.  Neurological:  Positive for headaches. Negative for dizziness and light-headedness.     Physical Exam Triage Vital Signs ED Triage Vitals  Encounter Vitals Group     BP 03/12/23 1755 119/84     Systolic BP Percentile --      Diastolic BP Percentile --      Pulse Rate 03/12/23 1755 99     Resp 03/12/23 1755 16     Temp 03/12/23 1755 98 F (36.7 C)     Temp Source 03/12/23 1755 Oral     SpO2 03/12/23 1755 100 %     Weight --      Height --      Head Circumference --      Peak Flow --      Pain Score 03/12/23 1759 7     Pain Loc --      Pain Education --      Exclude from Growth Chart --    No data found.  Updated Vital Signs BP 119/84   Pulse 99   Temp 98 F (36.7 C) (Oral)   Resp 16   LMP  (LMP Unknown)   SpO2 100%   Visual Acuity Right Eye Distance:   Left Eye Distance:   Bilateral Distance:    Right Eye Near:   Left Eye Near:    Bilateral Near:     Physical Exam Vitals reviewed.  Constitutional:      General: She is awake. She is not in acute distress.    Appearance: Normal appearance. She is well-developed. She is not ill-appearing.     Comments: Very pleasant female appears stated age in no acute distress sitting comfortably in exam room  HENT:     Head: Normocephalic and atraumatic.     Right Ear: Tympanic membrane, ear canal and external ear normal. Tympanic membrane is not erythematous or bulging.     Left Ear: Tympanic membrane, ear canal and external ear normal. Tympanic membrane is not erythematous or bulging.     Mouth/Throat:     Pharynx: Uvula midline. No oropharyngeal exudate or posterior oropharyngeal erythema.  Cardiovascular:     Rate and Rhythm: Normal rate and regular rhythm.     Heart sounds: Normal heart sounds, S1 normal and S2 normal. No murmur heard. Pulmonary:     Effort: Pulmonary effort is  normal.     Breath sounds: Normal breath sounds. No wheezing, rhonchi or rales.     Comments: Clear to auscultation bilaterally Abdominal:     General: Bowel sounds are normal.     Palpations: Abdomen is soft.     Tenderness: There  is abdominal tenderness in the epigastric area. There is no right CVA tenderness, left CVA tenderness, guarding or rebound. Negative signs include Murphy's sign.     Comments: Mild tenderness palpation throughout upper abdomen.  No evidence of acute abdomen on physical exam.  Psychiatric:        Behavior: Behavior is cooperative.      UC Treatments / Results  Labs (all labs ordered are listed, but only abnormal results are displayed) Labs Reviewed  POCT URINALYSIS DIP (MANUAL ENTRY) - Abnormal; Notable for the following components:      Result Value   Clarity, UA cloudy (*)    pH, UA 8.5 (*)    All other components within normal limits  LIPASE  POCT URINE PREGNANCY  POCT FASTING CBG [facility] MANUAL ENTRY    EKG   Radiology No results found.  Procedures Procedures (including critical care time)  Medications Ordered in UC Medications  ondansetron (ZOFRAN-ODT) disintegrating tablet 4 mg (4 mg Oral Given 03/12/23 1824)  alum & mag hydroxide-simeth (MAALOX/MYLANTA) 200-200-20 MG/5ML suspension 15 mL (15 mLs Oral Given 03/12/23 1824)    Initial Impression / Assessment and Plan / UC Course  I have reviewed the triage vital signs and the nursing notes.  Pertinent labs & imaging results that were available during my care of the patient were reviewed by me and considered in my medical decision making (see chart for details).     Patient is well-appearing, afebrile, nontoxic, nontachycardic.  Vital signs of physical exam are reassuring with no indication for emergent evaluation or imaging.  Urine pregnancy was negative.  UA did not show any evidence of infection or dehydration.  She was given Zofran and Maalox with improvement of symptoms in clinic.  We  discussed that given her clinical presentation I am concerned for beginning of viral gastroenteritis and she may develop diarrhea and additional symptoms in the next few days.  We discussed that she can use the Zofran to help manage the nausea and vomiting but that if she has recurrent nausea/emesis despite antiemetic medication she would need to go to the emergency room.  Viral testing for flu and COVID were deferred as she had no URI symptoms.  She had blood work obtained earlier with her psychiatry office including a CBC and CMP so this was not repeated.  We attempted to draw lipase given her upper abdominal pain but were unsuccessful; she will return tomorrow to have this drawn.  She was encouraged to eat a bland diet and drink plenty of fluids.  We discussed that if anything changes and she develops severe abdominal pain, fever, melena, nausea/vomiting despite antiemetics, hematemesis she needs to be seen emergently.  Strict return precautions given.  Excuse note provided.  Final Clinical Impressions(s) / UC Diagnoses   Final diagnoses:  Upper abdominal pain  Nausea without vomiting  Viral illness  Chills (without fever)     Discharge Instructions      Your urine was essentially normal.  Your blood sugar was normal.  Use Zofran every 8 hours as needed for nausea and vomiting symptoms.  Eat a bland diet and drink plenty of fluids.  As we discussed, I think you will likely have a virus and it may be that you develop some diarrhea.  If anything is severe and you have severe abdominal pain, nausea/vomiting despite the medication, blood in your stool, blood in your vomit, more than 10 bowel movements per day, difficulty eating and drinking, high fever you need to  go to the emergency room immediately.  Follow-up with your primary care later this week if your symptoms have not resolved.     ED Prescriptions     Medication Sig Dispense Auth. Provider   ondansetron (ZOFRAN-ODT) 4 MG disintegrating  tablet Take 1 tablet (4 mg total) by mouth every 8 (eight) hours as needed for nausea or vomiting. 20 tablet Savvy Peeters, Noberto Retort, PA-C      PDMP not reviewed this encounter.   Jeani Hawking, PA-C 03/12/23 2008

## 2023-03-12 NOTE — Discharge Instructions (Addendum)
 Your urine was essentially normal.  Your blood sugar was normal.  Use Zofran every 8 hours as needed for nausea and vomiting symptoms.  Eat a bland diet and drink plenty of fluids.  As we discussed, I think you will likely have a virus and it may be that you develop some diarrhea.  If anything is severe and you have severe abdominal pain, nausea/vomiting despite the medication, blood in your stool, blood in your vomit, more than 10 bowel movements per day, difficulty eating and drinking, high fever you need to go to the emergency room immediately.  Follow-up with your primary care later this week if your symptoms have not resolved.

## 2023-03-12 NOTE — Progress Notes (Signed)
 Pt tolerated Venipuncture well with no complaints in right hand.   JNL, CMA

## 2023-03-12 NOTE — ED Notes (Signed)
 Patient to go to [facility] in morning for blood draw, pt notified.

## 2023-03-12 NOTE — ED Notes (Signed)
 Unsuccessful venipuncture x 2, E Raspet PA-C notified

## 2023-03-12 NOTE — ED Triage Notes (Signed)
 Has had abd pain, nausea, chills, headache x 2 days. elevated bp 145/98 earlier today. No fever. Has taken tylenol.

## 2023-03-15 LAB — CBC WITH DIFFERENTIAL/PLATELET
Basophils Absolute: 0 10*3/uL (ref 0.0–0.2)
Basos: 0 %
EOS (ABSOLUTE): 0.1 10*3/uL (ref 0.0–0.4)
Eos: 1 %
Hematocrit: 53.1 % — ABNORMAL HIGH (ref 34.0–46.6)
Hemoglobin: 16.7 g/dL — ABNORMAL HIGH (ref 11.1–15.9)
Immature Grans (Abs): 0 10*3/uL (ref 0.0–0.1)
Immature Granulocytes: 0 %
Lymphocytes Absolute: 1.4 10*3/uL (ref 0.7–3.1)
Lymphs: 27 %
MCH: 24.7 pg — ABNORMAL LOW (ref 26.6–33.0)
MCHC: 31.5 g/dL (ref 31.5–35.7)
MCV: 78 fL — ABNORMAL LOW (ref 79–97)
Monocytes Absolute: 0.4 10*3/uL (ref 0.1–0.9)
Monocytes: 7 %
Neutrophils Absolute: 3.4 10*3/uL (ref 1.4–7.0)
Neutrophils: 65 %
Platelets: 369 10*3/uL (ref 150–450)
RBC: 6.77 x10E6/uL — ABNORMAL HIGH (ref 3.77–5.28)
RDW: 17.4 % — ABNORMAL HIGH (ref 11.7–15.4)
WBC: 5.3 10*3/uL (ref 3.4–10.8)

## 2023-03-15 LAB — COMPREHENSIVE METABOLIC PANEL

## 2023-03-15 LAB — TSH

## 2023-03-15 LAB — VITAMIN D 25 HYDROXY (VIT D DEFICIENCY, FRACTURES)

## 2023-03-16 ENCOUNTER — Other Ambulatory Visit: Payer: Self-pay

## 2023-03-16 ENCOUNTER — Encounter (HOSPITAL_BASED_OUTPATIENT_CLINIC_OR_DEPARTMENT_OTHER): Payer: Self-pay | Admitting: Orthopedic Surgery

## 2023-03-20 ENCOUNTER — Encounter (HOSPITAL_COMMUNITY): Payer: Self-pay

## 2023-03-23 ENCOUNTER — Other Ambulatory Visit: Payer: Self-pay

## 2023-03-23 ENCOUNTER — Ambulatory Visit (HOSPITAL_BASED_OUTPATIENT_CLINIC_OR_DEPARTMENT_OTHER): Admitting: Anesthesiology

## 2023-03-23 ENCOUNTER — Encounter (HOSPITAL_BASED_OUTPATIENT_CLINIC_OR_DEPARTMENT_OTHER)
Admission: RE | Disposition: A | Discharge status: Home / Self Care | Payer: Self-pay | Source: Home / Self Care | Attending: Orthopedic Surgery

## 2023-03-23 ENCOUNTER — Ambulatory Visit (HOSPITAL_BASED_OUTPATIENT_CLINIC_OR_DEPARTMENT_OTHER)
Admission: RE | Admit: 2023-03-23 | Discharge: 2023-03-23 | Disposition: A | Discharge status: Home / Self Care | Payer: Medicaid Other | Attending: Orthopedic Surgery | Admitting: Orthopedic Surgery

## 2023-03-23 ENCOUNTER — Encounter (HOSPITAL_BASED_OUTPATIENT_CLINIC_OR_DEPARTMENT_OTHER): Payer: Self-pay | Admitting: Orthopedic Surgery

## 2023-03-23 DIAGNOSIS — M654 Radial styloid tenosynovitis [de Quervain]: Secondary | ICD-10-CM

## 2023-03-23 DIAGNOSIS — E669 Obesity, unspecified: Secondary | ICD-10-CM | POA: Diagnosis not present

## 2023-03-23 DIAGNOSIS — E119 Type 2 diabetes mellitus without complications: Secondary | ICD-10-CM | POA: Insufficient documentation

## 2023-03-23 DIAGNOSIS — Z6837 Body mass index (BMI) 37.0-37.9, adult: Secondary | ICD-10-CM | POA: Insufficient documentation

## 2023-03-23 DIAGNOSIS — F32A Depression, unspecified: Secondary | ICD-10-CM | POA: Diagnosis not present

## 2023-03-23 HISTORY — PX: MINOR RELEASE DORSAL COMPARTMENT (DEQUERVAINS): SHX5980

## 2023-03-23 SURGERY — MINOR RELEASE DORSAL COMPARTMENT (DEQUERVAINS)
Anesthesia: Monitor Anesthesia Care | Laterality: Right

## 2023-03-23 MED ORDER — CEFAZOLIN SODIUM-DEXTROSE 2-4 GM/100ML-% IV SOLN
2.0000 g | INTRAVENOUS | Status: AC
  Administered 2023-03-23: 2 g via INTRAVENOUS

## 2023-03-23 MED ORDER — PROPOFOL 10 MG/ML IV BOLUS
INTRAVENOUS | Status: DC | PRN
  Administered 2023-03-23: 30 mg via INTRAVENOUS
  Administered 2023-03-23: 50 mg via INTRAVENOUS

## 2023-03-23 MED ORDER — ROPIVACAINE HCL 5 MG/ML IJ SOLN
INTRAMUSCULAR | Status: DC | PRN
  Administered 2023-03-23: 10 mL

## 2023-03-23 MED ORDER — ONDANSETRON HCL 4 MG/2ML IJ SOLN
INTRAMUSCULAR | Status: AC
  Filled 2023-03-23: qty 2

## 2023-03-23 MED ORDER — PROPOFOL 10 MG/ML IV BOLUS
INTRAVENOUS | Status: AC
  Filled 2023-03-23: qty 20

## 2023-03-23 MED ORDER — FENTANYL CITRATE (PF) 100 MCG/2ML IJ SOLN
INTRAMUSCULAR | Status: DC | PRN
  Administered 2023-03-23 (×2): 25 ug via INTRAVENOUS
  Administered 2023-03-23: 50 ug via INTRAVENOUS

## 2023-03-23 MED ORDER — FENTANYL CITRATE (PF) 100 MCG/2ML IJ SOLN
INTRAMUSCULAR | Status: AC
  Filled 2023-03-23: qty 2

## 2023-03-23 MED ORDER — PHENYLEPHRINE 80 MCG/ML (10ML) SYRINGE FOR IV PUSH (FOR BLOOD PRESSURE SUPPORT)
PREFILLED_SYRINGE | INTRAVENOUS | Status: AC
  Filled 2023-03-23: qty 10

## 2023-03-23 MED ORDER — FENTANYL CITRATE (PF) 100 MCG/2ML IJ SOLN
INTRAMUSCULAR | Status: AC
Start: 2023-03-23 — End: ?
  Filled 2023-03-23: qty 2

## 2023-03-23 MED ORDER — DEXAMETHASONE SODIUM PHOSPHATE 10 MG/ML IJ SOLN
INTRAMUSCULAR | Status: AC
  Filled 2023-03-23: qty 1

## 2023-03-23 MED ORDER — LACTATED RINGERS IV SOLN: INTRAVENOUS | Status: DC | PRN

## 2023-03-23 MED ORDER — MIDAZOLAM HCL 2 MG/2ML IJ SOLN
INTRAMUSCULAR | Status: DC | PRN
  Administered 2023-03-23: 2 mg via INTRAVENOUS

## 2023-03-23 MED ORDER — LIDOCAINE 2% (20 MG/ML) 5 ML SYRINGE
INTRAMUSCULAR | Status: AC
  Filled 2023-03-23: qty 5

## 2023-03-23 MED ORDER — FENTANYL CITRATE (PF) 100 MCG/2ML IJ SOLN
25.0000 ug | INTRAMUSCULAR | Status: DC | PRN
  Administered 2023-03-23: 25 ug via INTRAVENOUS

## 2023-03-23 MED ORDER — AMISULPRIDE (ANTIEMETIC) 5 MG/2ML IV SOLN: 10.0000 mg | Freq: Once | INTRAVENOUS | Status: DC | PRN

## 2023-03-23 MED ORDER — OXYCODONE HCL 5 MG PO TABS: 5.0000 mg | ORAL_TABLET | Freq: Once | ORAL | Status: DC | PRN

## 2023-03-23 MED ORDER — OXYCODONE HCL 5 MG/5ML PO SOLN: 5.0000 mg | Freq: Once | ORAL | Status: DC | PRN

## 2023-03-23 MED ORDER — LACTATED RINGERS IV SOLN: INTRAVENOUS | Status: DC

## 2023-03-23 MED ORDER — ACETAMINOPHEN 500 MG PO TABS
ORAL_TABLET | ORAL | Status: AC
  Filled 2023-03-23: qty 2

## 2023-03-23 MED ORDER — PROPOFOL 500 MG/50ML IV EMUL
INTRAVENOUS | Status: DC | PRN
  Administered 2023-03-23: 100 ug/kg/min via INTRAVENOUS

## 2023-03-23 MED ORDER — ACETAMINOPHEN 500 MG PO TABS
1000.0000 mg | ORAL_TABLET | Freq: Once | ORAL | Status: AC
Start: 2023-03-23 — End: 2023-03-23
  Administered 2023-03-23: 1000 mg via ORAL

## 2023-03-23 MED ORDER — BUPIVACAINE HCL (PF) 0.5 % IJ SOLN
INTRAMUSCULAR | Status: AC
  Filled 2023-03-23: qty 30

## 2023-03-23 MED ORDER — CEFAZOLIN SODIUM-DEXTROSE 2-4 GM/100ML-% IV SOLN
INTRAVENOUS | Status: AC
  Filled 2023-03-23: qty 100

## 2023-03-23 MED ORDER — LIDOCAINE HCL (PF) 1 % IJ SOLN
INTRAMUSCULAR | Status: AC
  Filled 2023-03-23: qty 30

## 2023-03-23 MED ORDER — 0.9 % SODIUM CHLORIDE (POUR BTL) OPTIME
TOPICAL | Status: DC | PRN
  Administered 2023-03-23: 1000 mL

## 2023-03-23 MED ORDER — OXYCODONE HCL 5 MG PO TABS: 5.0000 mg | ORAL_TABLET | Freq: Four times a day (QID) | ORAL | 0 refills | Status: DC | PRN

## 2023-03-23 MED ORDER — MIDAZOLAM HCL 2 MG/2ML IJ SOLN
INTRAMUSCULAR | Status: AC
  Filled 2023-03-23: qty 2

## 2023-03-23 SURGICAL SUPPLY — 37 items
BLADE SURG 15 STRL LF DISP TIS (BLADE) ×2 IMPLANT
BNDG COHESIVE 4X5 TAN STRL LF (GAUZE/BANDAGES/DRESSINGS) ×1 IMPLANT
BNDG ELASTIC 4INX 5YD STR LF (GAUZE/BANDAGES/DRESSINGS) ×1 IMPLANT
BNDG GAUZE DERMACEA FLUFF 4 (GAUZE/BANDAGES/DRESSINGS) IMPLANT
CHLORAPREP W/TINT 26 (MISCELLANEOUS) ×2 IMPLANT
CORD BIPOLAR FORCEPS 12FT (ELECTRODE) ×1 IMPLANT
COVER BACK TABLE 60X90IN (DRAPES) ×1 IMPLANT
CUFF TOURN SGL QUICK 18X4 (TOURNIQUET CUFF) IMPLANT
DERMABOND ADVANCED .7 DNX12 (GAUZE/BANDAGES/DRESSINGS) IMPLANT
DRAPE HAND 77X146 (DRAPES) ×1 IMPLANT
GAUZE SPONGE 4X4 12PLY STRL (GAUZE/BANDAGES/DRESSINGS) ×1 IMPLANT
GAUZE STRETCH 2X75IN STRL (MISCELLANEOUS) ×1 IMPLANT
GAUZE XEROFORM 1X8 LF (GAUZE/BANDAGES/DRESSINGS) IMPLANT
GLOVE BIO SURGEON STRL SZ7.5 (GLOVE) ×1 IMPLANT
GLOVE BIOGEL PI IND STRL 7.5 (GLOVE) ×1 IMPLANT
GOWN STRL REUS W/ TWL LRG LVL3 (GOWN DISPOSABLE) ×2 IMPLANT
GOWN STRL REUS W/TWL XL LVL3 (GOWN DISPOSABLE) IMPLANT
GOWN STRL SURGICAL XL XLNG (GOWN DISPOSABLE) ×1 IMPLANT
NDL HYPO 25X5/8 SAFETYGLIDE (NEEDLE) IMPLANT
NEEDLE HYPO 25X5/8 SAFETYGLIDE (NEEDLE) ×1 IMPLANT
NS IRRIG 1000ML POUR BTL (IV SOLUTION) IMPLANT
PACK BASIN DAY SURGERY FS (CUSTOM PROCEDURE TRAY) ×1 IMPLANT
PADDING CAST ABS COTTON 4X4 ST (CAST SUPPLIES) IMPLANT
SHEET MEDIUM DRAPE 40X70 STRL (DRAPES) ×1 IMPLANT
SPIKE FLUID TRANSFER (MISCELLANEOUS) IMPLANT
SPLINT PLASTER CAST XFAST 4X15 (CAST SUPPLIES) ×1 IMPLANT
STOCKINETTE IMPERVIOUS 9X36 MD (GAUZE/BANDAGES/DRESSINGS) ×1 IMPLANT
STRIP CLOSURE SKIN 1/2X4 (GAUZE/BANDAGES/DRESSINGS) IMPLANT
SUCTION TUBE FRAZIER 10FR DISP (SUCTIONS) IMPLANT
SUT ETHILON 4 0 PS 2 18 (SUTURE) IMPLANT
SUT MNCRL AB 3-0 PS2 18 (SUTURE) IMPLANT
SUT MNCRL AB 4-0 PS2 18 (SUTURE) IMPLANT
SUT VIC AB 3-0 PS2 18XBRD (SUTURE) IMPLANT
SYR BULB EAR ULCER 3OZ GRN STR (SYRINGE) ×2 IMPLANT
SYR CONTROL 10ML LL (SYRINGE) IMPLANT
TOWEL GREEN STERILE FF (TOWEL DISPOSABLE) ×2 IMPLANT
TUBE CONNECTING 20X1/4 (TUBING) IMPLANT

## 2023-03-23 NOTE — Transfer of Care (Signed)
 Immediate Anesthesia Transfer of Care Note  Patient: Shirley Greene  Procedure(s) Performed: RIGHT WRIST FIRST EXTENSOR COMPARTMENT RELEASE  (DEQUERVAINS) (Right)  Patient Location: PACU  Anesthesia Type:MAC  Level of Consciousness: awake, alert , and oriented  Airway & Oxygen Therapy: Patient Spontanous Breathing and Patient connected to face mask oxygen  Post-op Assessment: Report given to RN and Post -op Vital signs reviewed and stable  Post vital signs: Reviewed and stable  Last Vitals:  Vitals Value Taken Time  BP 116/80 03/23/23 1018  Temp    Pulse 86 03/23/23 1019  Resp 19 03/23/23 1019  SpO2 100 % 03/23/23 1019  Vitals shown include unfiled device data.  Last Pain:  Vitals:   03/23/23 0700  TempSrc:   PainSc: 5          Complications: No notable events documented.

## 2023-03-23 NOTE — Op Note (Signed)
 NAME: Shirley Greene MEDICAL RECORD NO: [id number] DATE OF BIRTH: 1995-12-24 FACILITY: [facility] LOCATION:  SURGERY CENTER PHYSICIAN: Samuella Cota, MD   OPERATIVE REPORT   DATE OF PROCEDURE: 03/23/23    PREOPERATIVE DIAGNOSIS: Right wrist de Quervain's tenosynovitis   POSTOPERATIVE DIAGNOSIS: Right wrist de Quervain's tenosynovitis   PROCEDURE: Right wrist first extensor compartment release   SURGEON:  Samuella Cota, M.D.   ASSISTANT: None   ANESTHESIA:  Local with sedation   INTRAVENOUS FLUIDS:  Per anesthesia flow sheet.   ESTIMATED BLOOD LOSS:  Minimal.   COMPLICATIONS:  None.   SPECIMENS:  none   TOURNIQUET TIME:    Total Tourniquet Time Documented: Upper Arm (Right) - 16 minutes Total: Upper Arm (Right) - 16 minutes    DISPOSITION:  Stable to PACU.   INDICATIONS:  This is a 28 year old female was seen in the outpatient setting and found to have signs and symptoms consistent with right wrist de Quervain's tenosynovitis that was refractory to conservative care.  After extensive discussion, patient was indicated for right wrist first extensor compartment release.  Risks and benefits of surgery were discussed including the risks of infection, bleeding, scarring, stiffness, nerve injury, vascular injury, tendon injury, tendon instability, need for subsequent operation, , recurrence.  She voiced understanding of these risks and elected to proceed.  OPERATIVE COURSE: Patient was seen and identified in the preoperative area and marked appropriately.  Surgical consent had been signed. Preoperative IV antibiotic prophylaxis was given. She was transferred to the operating room and placed in supine position with the Right upper extremity on an arm board.  Sedation was induced by the anesthesiologist.  Right upper extremity was prepped and draped in normal sterile orthopedic fashion.  A surgical pause was performed between the surgeons, anesthesia, and operating  room staff and all were in agreement as to the patient, procedure, and site of procedure.  Tourniquet was placed and padded appropriately to the right upper arm.  10cc of 0.5% ropivacaine was utilized for anesthetic purposes around the planned incisional site.  A 3 cm incision was designed distal to the radial styloid at the glabrous/nonglabrous border of the dorsal radial wrist.  Crossing branches of the radial sensory nerve were identified and carefully protected.  The first extensor compartment was identified.  This was sharply incised along its dorsal most aspect.  The abductor pollicis longus was identified and released.  The subsheath to the extensor pollicis brevis was identified and sharply divided as well.   Following tendon sheath incision over the radial styloid, extensor tenosynovectomy was completed.  Inflammatory tissue was identified and sharply excised.  A tenolysis was performed for both the extensor pollicis brevis and abductor pollicis longus tendons as they were adherent to each other.  Smooth gliding to the tendon services was noted following extensor tenolysis.  There was some mild evidence of subluxation of the extensor pollicis brevis with wrist circumduction, decision was made to perform loose closure of the first extensor compartment utilizing 3-0 Vicryl in figure-of-eight fashion.  Shirley Greene was placed beneath the extensor compartment surface during closure to prevent excess tightening with loose closure.  Once again, there was noted to be smooth tendon gliding, no further subluxation with wrist circumduction.  The tourniquet was deflated at 16 minutes and bipolar electrocautery was utilized for hemostasis.  Wound was closed in layers utilizing 3-0 Monocryl for the subcutaneous tissue and a running 4-0 Monocryl stitch for the skin surface.  Dermabond was utilized.  Sterile dressings were applied  followed by application of a thumb spica splint utilizing plaster.  Fingertips were pink with  brisk capillary refill after deflation of tourniquet.  The operative drapes were broken down.  The patient was awoken from anesthesia safely and taken to PACU in stable condition.  I will see her back in the office in 2 weeks for postoperative followup.    Post-operative plan: The patient will recover in the post-anesthesia care unit and then be discharged home.  The patient will be non weight bearing on the right upper extremity in a thumb spica splint.   I will see the patient back in the office in 2 weeks for postoperative followup.  Discharge instructions were provided for appropriate wound and dressing care as well as utilization of pain medicine.  Samuella Cota, MD Electronically signed, 03/23/23

## 2023-03-23 NOTE — Discharge Instructions (Addendum)
    Hand Surgery Postop Instructions   Dressings: Maintain postoperative dressing until orthopedic follow-up.  Keep operative site clean and dry until orthopedic follow-up.  Wound Care: Keep your hand elevated above the level of your heart.  Do not allow it to dangle by your side. Moving your fingers is advised to stimulate circulation but will depend on the site of your surgery.  If you have a splint applied, your doctor will advise you regarding movement.  Activity: Do not drive or operate machinery until clearance given from physician. No heavy lifting with operative extremity.  Diet:  Drink liquids today or eat a light diet.  You may resume a regular diet tomorrow.    General expectations: Take prescribed medication if given, transition to over-the-counter medication as quickly as possible. Fingers may become slightly swollen.  Call your doctor if any of the following occur: Severe pain not relieved by pain medication. Elevated temperature. Dressing soaked with blood. Inability to move fingers. White or bluish color to fingers.   Per [facility] clinic policy, our goal is ensure optimal postoperative pain control with a multimodal pain management strategy. For all [facility] patients, our goal is to wean post-operative narcotic medications by 6 weeks post-operatively. If this is not possible due to utilization of pain medication prior to surgery, your [facility] doctor will support your acute post-operative pain control for the first 6 weeks postoperatively, with a plan to transition you back to your primary pain team following that. Cyndia Skeeters will work to ensure a Therapist, occupational.  Anshul Trevor Mace, M.D. Hand Surgery [facility]    Post Anesthesia Home Care Instructions  Activity: Get plenty of rest for the remainder of the day. A responsible individual must stay with you for 24 hours following the procedure.  For the next 24 hours, DO NOT: -Drive a  car -Advertising copywriter -Drink alcoholic beverages -Take any medication unless instructed by your physician -Make any legal decisions or sign important papers.  Meals: Start with liquid foods such as gelatin or soup. Progress to regular foods as tolerated. Avoid greasy, spicy, heavy foods. If nausea and/or vomiting occur, drink only clear liquids until the nausea and/or vomiting subsides. Call your physician if vomiting continues.  Special Instructions/Symptoms: Your throat may feel dry or sore from the anesthesia or the breathing tube placed in your throat during surgery. If this causes discomfort, gargle with warm salt water. The discomfort should disappear within 24 hours.  If you had a scopolamine patch placed behind your ear for the management of post- operative nausea and/or vomiting:  1. The medication in the patch is effective for 72 hours, after which it should be removed.  Wrap patch in a tissue and discard in the trash. Wash hands thoroughly with soap and water. 2. You may remove the patch earlier than 72 hours if you experience unpleasant side effects which may include dry mouth, dizziness or visual disturbances. 3. Avoid touching the patch. Wash your hands with soap and water after contact with the patch.     May take Tylenol today after 1:00 PM

## 2023-03-23 NOTE — Anesthesia Postprocedure Evaluation (Signed)
 Anesthesia Post Note  Patient: Shirley Greene  Procedure(s) Performed: RIGHT WRIST FIRST EXTENSOR COMPARTMENT RELEASE  (DEQUERVAINS) (Right)     Patient location during evaluation: PACU Anesthesia Type: MAC Level of consciousness: awake and alert Pain management: pain level controlled Vital Signs Assessment: post-procedure vital signs reviewed and stable Respiratory status: spontaneous breathing, nonlabored ventilation, respiratory function stable and patient connected to nasal cannula oxygen Cardiovascular status: stable and blood pressure returned to baseline Postop Assessment: no apparent nausea or vomiting Anesthetic complications: no  There were no known notable events for this encounter.  Last Vitals:  Vitals:   03/23/23 1045 03/23/23 1130  BP: 125/89 118/80  Pulse: 76 69  Resp: 12 16  Temp:  (!) 36.1 C  SpO2: 97% 96%    Last Pain:  Vitals:   03/23/23 1130  TempSrc: Temporal  PainSc: 3                  Alfonse Garringer L Nikolai Wilczak

## 2023-03-23 NOTE — Anesthesia Preprocedure Evaluation (Addendum)
 Anesthesia Evaluation  Patient identified by MRN, date of birth, ID band Patient awake    Reviewed: Allergy & Precautions, NPO status , Patient's Chart, lab work & pertinent test results  Airway Mallampati: III  TM Distance: >3 FB Neck ROM: Full    Dental no notable dental hx. (+) Teeth Intact, Dental Advisory Given   Pulmonary neg pulmonary ROS   Pulmonary exam normal breath sounds clear to auscultation       Cardiovascular negative cardio ROS Normal cardiovascular exam Rhythm:Regular Rate:Normal     Neuro/Psych  PSYCHIATRIC DISORDERS  Depression    negative neurological ROS     GI/Hepatic negative GI ROS, Neg liver ROS,,,  Endo/Other  diabetes  Obese BMI 39  Renal/GU negative Renal ROS  negative genitourinary   Musculoskeletal negative musculoskeletal ROS (+)    Abdominal   Peds  Hematology negative hematology ROS (+)   Anesthesia Other Findings   Reproductive/Obstetrics                              Anesthesia Physical Anesthesia Plan  ASA: 2  Anesthesia Plan: MAC   Post-op Pain Management: Tylenol PO (pre-op)*   Induction: Intravenous  PONV Risk Score and Plan: 2 and Propofol infusion, Treatment may vary due to age or medical condition, Midazolam, Ondansetron and Dexamethasone  Airway Management Planned: Natural Airway  Additional Equipment:   Intra-op Plan:   Post-operative Plan:   Informed Consent: I have reviewed the patients History and Physical, chart, labs and discussed the procedure including the risks, benefits and alternatives for the proposed anesthesia with the patient or authorized representative who has indicated his/her understanding and acceptance.     Dental advisory given  Plan Discussed with: CRNA  Anesthesia Plan Comments:         Anesthesia Quick Evaluation

## 2023-03-23 NOTE — H&P (Signed)
 @LOGODEPT @  LAKASHA MCFALL - 28 y.o. female MRN [id number]  Date of birth: Feb 06, 1995   HAND SURGERY H&P UPDATE   HPI: Patient is a 28 y.o. female who presents with ongoing right wrist de Quervains tenosynovitis.  Patient denies any changes to their medical history or new systemic symptoms today.    Past Medical History:  Diagnosis Date   Alpha thalassemia silent carrier 05/27/2018   Anemia    Cervical dysplasia 02/15/2017   02/2018: pap neg   Chlamydia    GDM (gestational diabetes mellitus), class A1 07/11/2021   Infection    UTI   Postpartum depression    Sciatica 07/16/2018   Past Surgical History:  Procedure Laterality Date   CESAREAN SECTION N/A 04/05/2012   Procedure: CESAREAN SECTION;  Surgeon: Tilda Burrow, MD;  Location: [facility] ORS;  Service: Obstetrics;  Laterality: N/A;   CESAREAN SECTION WITH BILATERAL TUBAL LIGATION N/A 11/28/2018   Procedure: cesarean section;  Surgeon: Hermina Staggers, MD;  Location: [facility] ORS;  Service: Obstetrics;  Laterality: N/A;   COLPOSCOPY  11/28/2022   DILATION AND EVACUATION N/A 11/19/2020   Procedure: DILATATION AND EVACUATION;  Surgeon: Myna Hidalgo, DO;  Location: [facility] OR;  Service: Gynecology;  Laterality: N/A;   OPERATIVE ULTRASOUND N/A 11/19/2020   Procedure: OPERATIVE ULTRASOUND;  Surgeon: Myna Hidalgo, DO;  Location: [facility] OR;  Service: Gynecology;  Laterality: N/A;   Social History   Socioeconomic History   Marital status: Single    Spouse name: Not on file   Number of children: Not on file   Years of education: Not on file   Highest education level: Not on file  Occupational History   Not on file  Tobacco Use   Smoking status: Never   Smokeless tobacco: Never  Vaping Use   Vaping status: Never Used  Substance and Sexual Activity   Alcohol use: Yes    Comment: every few months   Drug use: No   Sexual activity: Not Currently    Birth control/protection: None  Other Topics Concern   Not on file  Social History  Narrative   Not on file   Social Drivers of Health   Financial Resource Strain: Low Risk  (12/19/2022)   Overall Financial Resource Strain (CARDIA)    Difficulty of Paying Living Expenses: Not hard at all  Food Insecurity: No Food Insecurity (12/19/2022)   Hunger Vital Sign    Worried About Running Out of Food in the Last Year: Never true    Ran Out of Food in the Last Year: Never true  Transportation Needs: No Transportation Needs (12/19/2022)   PRAPARE - Administrator, Civil Service (Medical): No    Lack of Transportation (Non-Medical): No  Physical Activity: Inactive (12/19/2022)   Exercise Vital Sign    Days of Exercise per Week: 0 days    Minutes of Exercise per Session: 0 min  Stress: Stress Concern Present (12/19/2022)   [employer] of Occupational Health - Occupational Stress Questionnaire    Feeling of Stress : To some extent  Social Connections: Moderately Isolated (12/19/2022)   Social Connection and Isolation Panel [NHANES]    Frequency of Communication with Friends and Family: More than three times a week    Frequency of Social Gatherings with Friends and Family: Once a week    Attends Religious Services: Never    Database administrator or Organizations: No    Attends Banker Meetings: Never  Marital Status: Living with partner   Family History  Problem Relation Age of Onset   Hypertension Mother    Hypertension Father    Cancer Maternal Grandfather    - negative except otherwise stated in the family history section No Known Allergies Prior to Admission medications   Medication Sig Start Date End Date Taking? Authorizing Provider  ondansetron (ZOFRAN-ODT) 4 MG disintegrating tablet Take 1 tablet (4 mg total) by mouth every 8 (eight) hours as needed for nausea or vomiting. 03/12/23   Raspet, Erin K, PA-C   No results found. - Positive ROS: All other systems have been reviewed and were otherwise negative with the exception of those  mentioned in the HPI and as above.  Physical Exam: General: No acute distress, resting comfortably Cardiovascular: BUE warm and well perfused, normal rate Respiratory: Normal WOB on RA Skin: Warm and dry Neurologic: Sensation intact distally Psychiatric: Patient is at baseline mood and affect  Right Upper Extremity  Skin and Muscle: No skin changes are apparent to upper extremities.  Muscle bulk and contour normal, no signs of atrophy.      Range of Motion and Palpation Tests: Mobility is full about the elbows with flexion and extension.  Forearm supination and pronation are 85/85 bilaterally.  Wrist flexion/extension is 75/65 left side, wrist flexion/extension limited secondary to pain right side, approximately 45/45.  Digital flexion and extension are full.     No cords or nodules are palpated.  No triggering is observed.   Finklestein test is positive right side, significant pain with associated swelling and tenderness.     Neurologic, Vascular, Motor: Sensation is intact to light touch in the median/radial/ulnar distributions.   Fingers pink and well perfused.  Capillary refill is brisk.     Assessment/Plan: OR today for Right wrist first extensor compartment release. We again reviewed the risks of surgery which include bleeding, infection, damage to neurovascular structures, persistent symptoms, need for additional surgery.  Informed consent was signed.  All questions were answered.   Paylin Hailu [facility], Hand Surgery

## 2023-03-24 ENCOUNTER — Encounter (HOSPITAL_BASED_OUTPATIENT_CLINIC_OR_DEPARTMENT_OTHER): Payer: Self-pay | Admitting: Orthopedic Surgery

## 2023-03-26 ENCOUNTER — Telehealth: Payer: Self-pay | Admitting: Orthopedic Surgery

## 2023-03-26 ENCOUNTER — Ambulatory Visit (HOSPITAL_COMMUNITY): Payer: Medicaid Other | Admitting: Licensed Clinical Social Worker

## 2023-03-26 ENCOUNTER — Encounter (HOSPITAL_COMMUNITY): Payer: Self-pay

## 2023-03-26 NOTE — Telephone Encounter (Signed)
 Patient called requesting for a call back please. Shirley Greene [phone]

## 2023-03-26 NOTE — Telephone Encounter (Signed)
 Called patient back an spoke with her. Explained her pain is normal. Told her to take a oxycodone for severe pain, and motrin in between.

## 2023-03-26 NOTE — Telephone Encounter (Signed)
 Patient called requesting for a call back regarding a few questions.

## 2023-03-27 ENCOUNTER — Other Ambulatory Visit: Payer: Self-pay | Admitting: Orthopedic Surgery

## 2023-03-27 DIAGNOSIS — M25531 Pain in right wrist: Secondary | ICD-10-CM

## 2023-03-28 ENCOUNTER — Telehealth: Payer: Self-pay

## 2023-03-28 NOTE — Telephone Encounter (Signed)
 Patient called stating that she is having some burning and sharp pains in her right wrist.  Would like an appt.to see Dr. Fara Boros.  CB#  [phone].  Please advise.  Thank you

## 2023-04-02 ENCOUNTER — Ambulatory Visit (INDEPENDENT_AMBULATORY_CARE_PROVIDER_SITE_OTHER): Admitting: Orthopedic Surgery

## 2023-04-02 DIAGNOSIS — M654 Radial styloid tenosynovitis [de Quervain]: Secondary | ICD-10-CM

## 2023-04-02 NOTE — Progress Notes (Signed)
   MAHRUKH SEGUIN - 28 y.o. female MRN [id number]  Date of birth: 01-Apr-1995  Office Visit Note: Visit Date: 04/02/2023 PCP: Patient, No Pcp Per Referred by: No ref. provider found  Subjective:  HPI: Shirley Greene is a 28 y.o. female who presents today for follow up 1 week status post right wrist first extensor compartment release.  She has been having some soreness along the radial aspect of the wrist with associated hypersensitivity.  Has felt better over the past 48 hours or so, is no longer requiring pain medicine.  Pertinent ROS were reviewed with the patient and found to be negative unless otherwise specified above in HPI.   Assessment & Plan: Visit Diagnoses:  1. De Quervain's disease (radial styloid tenosynovitis)     Plan: She is doing well overall, does have some hypersensitivity along the dorsal radial sensory nerve distribution.  Demonstrates appropriate wound healing.  Splint was placed today.  She will be seen by occupational therapy later this week for fabrication of a thumb spica orthosis.  She will begin gentle range of motion per protocol at the 2-week interval as well.  I will plan on seeing her back next week for a wound check.  Follow-up: No follow-ups on file.   Meds & Orders: No orders of the defined types were placed in this encounter.  No orders of the defined types were placed in this encounter.    Procedures: No procedures performed       Objective:   Vital Signs: LMP  (LMP Unknown)   Ortho Exam Right wrist: - Well-healed incision at the glabrous/nonglabrous juncture radial wrist - Thumb circumduction without significant pain or crepitus - Hand is warm well-perfused, sensation intact in all distributions including DRSN, positive Tinel's over the DRSN - Thumb pinch strength not tested today  Imaging: No results found.   Westin Knotts Trevor Mace, M.D. [facility], Hand Surgery

## 2023-04-05 ENCOUNTER — Encounter: Payer: Medicaid Other | Admitting: Orthopedic Surgery

## 2023-04-06 ENCOUNTER — Encounter (HOSPITAL_COMMUNITY): Payer: Self-pay

## 2023-04-06 ENCOUNTER — Ambulatory Visit: Attending: Orthopedic Surgery | Admitting: Occupational Therapy

## 2023-04-06 ENCOUNTER — Other Ambulatory Visit: Payer: Self-pay

## 2023-04-06 ENCOUNTER — Telehealth (HOSPITAL_COMMUNITY): Payer: Medicaid Other | Admitting: Psychiatry

## 2023-04-06 ENCOUNTER — Encounter (HOSPITAL_COMMUNITY): Payer: Self-pay | Admitting: Psychiatry

## 2023-04-06 DIAGNOSIS — R29898 Other symptoms and signs involving the musculoskeletal system: Secondary | ICD-10-CM | POA: Diagnosis present

## 2023-04-06 DIAGNOSIS — F411 Generalized anxiety disorder: Secondary | ICD-10-CM

## 2023-04-06 DIAGNOSIS — F53 Postpartum depression: Secondary | ICD-10-CM

## 2023-04-06 DIAGNOSIS — R278 Other lack of coordination: Secondary | ICD-10-CM | POA: Diagnosis present

## 2023-04-06 DIAGNOSIS — R208 Other disturbances of skin sensation: Secondary | ICD-10-CM | POA: Insufficient documentation

## 2023-04-06 DIAGNOSIS — M6281 Muscle weakness (generalized): Secondary | ICD-10-CM | POA: Insufficient documentation

## 2023-04-06 DIAGNOSIS — M79641 Pain in right hand: Secondary | ICD-10-CM | POA: Insufficient documentation

## 2023-04-06 DIAGNOSIS — M25531 Pain in right wrist: Secondary | ICD-10-CM | POA: Insufficient documentation

## 2023-04-06 MED ORDER — HYDROXYZINE HCL 10 MG PO TABS: 10.0000 mg | ORAL_TABLET | Freq: Every day | ORAL | 1 refills | Status: DC | PRN

## 2023-04-06 NOTE — Addendum Note (Signed)
 Addended by: Wynetta Emery on: 04/06/2023 06:10 PM   Modules accepted: Orders

## 2023-04-06 NOTE — Therapy (Signed)
 OUTPATIENT OCCUPATIONAL THERAPY ORTHO EVALUATION  Patient Name: Shirley Greene MRN: [id number] DOB:12-16-1995, 28 y.o., female Today's Date: 04/06/2023  PCP: No PCP per pt report REFERRING PROVIDER: Samuella Cota, MD   END OF SESSION:  OT End of Session - 04/06/23 1740     Visit Number 1    Number of Visits 13   including eval   Date for OT Re-Evaluation 06/01/23    Authorization Type [employer], Auth Required, VL: 27    OT Start Time 1107    OT Stop Time 1225    OT Time Calculation (min) 78 min    Activity Tolerance Patient limited by pain    Behavior During Therapy [facility] for tasks assessed/performed;Anxious             Past Medical History:  Diagnosis Date   Alpha thalassemia silent carrier 05/27/2018   Anemia    Cervical dysplasia 02/15/2017   02/2018: pap neg   Chlamydia    GDM (gestational diabetes mellitus), class A1 07/11/2021   Infection    UTI   Postpartum depression    Sciatica 07/16/2018   Past Surgical History:  Procedure Laterality Date   CESAREAN SECTION N/A 04/05/2012   Procedure: CESAREAN SECTION;  Surgeon: Tilda Burrow, MD;  Location: [facility];  Service: Obstetrics;  Laterality: N/A;   CESAREAN SECTION WITH BILATERAL TUBAL LIGATION N/A 11/28/2018   Procedure: cesarean section;  Surgeon: Hermina Staggers, MD;  Location: [facility];  Service: Obstetrics;  Laterality: N/A;   COLPOSCOPY  11/28/2022   DILATION AND EVACUATION N/A 11/19/2020   Procedure: DILATATION AND EVACUATION;  Surgeon: Myna Hidalgo, DO;  Location: [facility];  Service: Gynecology;  Laterality: N/A;   MINOR RELEASE DORSAL COMPARTMENT (DEQUERVAINS) Right 03/23/2023   Procedure: RIGHT WRIST FIRST EXTENSOR COMPARTMENT RELEASE  (DEQUERVAINS);  Surgeon: Samuella Cota, MD;  Location: [facility];  Service: Orthopedics;  Laterality: Right;  RNFA   OPERATIVE ULTRASOUND N/A 11/19/2020   Procedure: OPERATIVE ULTRASOUND;  Surgeon: Myna Hidalgo, DO;  Location: [facility];  Service:  Gynecology;  Laterality: N/A;   Patient Active Problem List   Diagnosis Date Noted   Suzette Battiest disease (radial styloid tenosynovitis) 03/23/2023   Postpartum depression    Current moderate episode of major depressive disorder without prior episode (HCC) 12/19/2022   History of hysterectomy leaving cervix intact 01/04/2022   Anemia affecting pregnancy, antepartum 11/14/2021   ASCUS with positive high risk HPV cervical on 04/05/2021 04/10/2021   Alpha thalassemia silent carrier 05/27/2018    ONSET DATE: 03/27/2023 (referral date), S/p De'Quervains release on 03/23/23, OT 2 week visit for ROM  REFERRING DIAG: M25.531 (ICD-10-CM) - Pain in right wrist   THERAPY DIAG:  Pain in right wrist  Pain in right hand  Other symptoms and signs involving the musculoskeletal system  Other disturbances of skin sensation  Muscle weakness (generalized)  Other lack of coordination  Rationale for Evaluation and Treatment: Rehabilitation  SUBJECTIVE:   SUBJECTIVE STATEMENT: Pt 2 weeks s/p today.  Pt reported new splint given on Monday. Pt presented today with bulky compressive covering. Pt reported upcoming appointment with Dr. Fara Boros next week to remove stitches.  Pt accompanied by: self and young son (80 year old)  PERTINENT HISTORY: anemia, sciatica, chlamydia, De Quervain's s/p Berline Lopes release 03/23/23, current episode of major depressive disorder without prior episode 12/24, postpartum depression  PRECAUTIONS: Other:   Gentle ROM per protocol at 2-week interval, progress per [facility] Hand Protocol 5th Edition  Per Dr. Fara Boros (03/29/23) : Pt demo'd some tendon instability intra op., fabricate forearm-based thumb spica splint then convert to hand-based at 4 weeks, recommended to remove splint for nerve and tendon glides to target hypersensitivity  RED FLAGS: Per Dr. Fara Boros (03/29/23):  Hypersensitivity concerns, tendon instability intra op    WEIGHT BEARING RESTRICTIONS: Yes, per [place] 5th  Edition Protocol  PAIN:  Are you having pain? Yes: NPRS scale: 0/10 at rest, 7/10 at worst today (throbbing which resolved with rest) when hand out of splint/protective coverings and pt attempted to move around room for childcare Pain location: over incision site, over lateral side of wrist Pain description: throbbing when pain occurs, which resolves with rest Aggravating factors: movement Relieving factors: rest  FALLS: Has patient fallen in last 6 months? No  LIVING ENVIRONMENT: Lives with: lives with their family Lives in: House/apartment  PLOF: ind  PATIENT GOALS: "to get back to normal"  NEXT MD VISIT: Dr. Fara Boros: 04/09/23  OBJECTIVE:  Note: Objective measures were completed at Evaluation unless otherwise noted.  HAND DOMINANCE: Right  ADLs: Eating: sore R hand, compensating with L hand PRN Grooming: sore R hand, compensating with L hand PRN Upper body dressing: sore R hand, compensating with L hand  PRN Lower body dressing: sore R hand, compensating with L hand  PRN Toileting: sore R hand, compensating with L hand PRN Bathing: sore R hand, compensating with L hand PRN  FUNCTIONAL OUTCOME MEASURES: QuickDash: 61.4% deficit     UPPER EXTREMITY ROM:     AROM - to be further assessed. AROM limited secondary to current precautions.   Active ROM Right eval Left eval  Shoulder flexion    Shoulder abduction    Shoulder adduction    Shoulder extension    Shoulder internal rotation    Shoulder external rotation    Elbow flexion    Elbow extension    Wrist flexion    Wrist extension    Wrist ulnar deviation    Wrist radial deviation    Wrist pronation    Wrist supination    (Blank rows = not tested)  Active ROM Right eval Left eval  Thumb MCP (0-60)    Thumb IP (0-80)    Thumb Radial abd/add (0-55)     Thumb Palmar abd/add (0-45)     Thumb Opposition to Small Finger     Index MCP (0-90)     Index PIP (0-100)     Index DIP (0-70)      Long MCP (0-90)       Long PIP (0-100)      Long DIP (0-70)      Ring MCP (0-90)      Ring PIP (0-100)      Ring DIP (0-70)      Little MCP (0-90)      Little PIP (0-100)      Little DIP (0-70)      (Blank rows = not tested)  Digits 2-5 AROM - WFL  HAND FUNCTION: Grip strength: Not tested per precautions  COORDINATION: 9 Hole Peg test: Not tested per precautions  SENSATION: Pt reported tingling of R thumb.  EDEMA: mild R wrist/hand  COGNITION: Overall cognitive status: Within functional limits for tasks assessed  OBSERVATIONS: Pt ambulated ind without A/E. Pt was pleasant. Pt expressed concerns about long healing process expected after surgery d/t need to care for children and other daily activities. Pt accompanied by young son in stroller today. Pt presented to OT session  today with bulky compressive covering at R wrist and hand and Xeroform over surgical site. OT removed Xeroform and noted surgical site appeared to be healing well with no apparent signs of infection. Pt demo'd some difficulty attending to precautions secondary to need to complete childcare tasks during evaluation today though pt attended to precautions to best of ability following education.    TREATMENT DATE: 04/06/23                                                                                                                            No treatment charged due to pt's payer source:  Self-Care OT educated pt on precautions, protocol, desensitization, monitoring skin integrity and monitoring for signs of infection, splint wear and care (see handout), strategies to don/doff splint, strategies to wash affected hand, functional tasks which may involve strengthening and caution to avoid strengthening secondary to current precautions. Pt verbalized understanding of all.  OT removed bulky compressive covering from pt's R arm. Xeroform replaced over surgical site. Surgical incision noted to be healing well, no apparent signs of  infection.   Orthotics OT fabricated forearm-based thumb spica splint for pt's RUE. OT provided pt with x3 stockinette and extra Velcro straps. Pt returned demo to ind don/doff splint.  TherEx HEP update: OT initiated tendon glides. Pt returned demo both in splint and out of splint. OT instructed pt to attempt tendon glides out of splint though may complete in splint if pain level increases above tolerance. Pt confirmed understanding.   PATIENT EDUCATION: Education details: see today's tx above Person educated: Patient Education method: Explanation Education comprehension: verbalized understanding  HOME EXERCISE PROGRAM: 04/06/23 - tendon glides, splint wear/care  GOALS: Goals reviewed with patient? Yes  SHORT TERM GOALS: Target date: 05/04/23  Pt will be ind with initial HEP using visual handouts.  Baseline: new to outpt OT, tendon glides issued Goal status: INITIAL  2.  Pt will demo understanding of splint wear/care schedule and ind don/doff splint. Baseline: splint fabricated at eval Goal status: INITIAL  3. Pt will return demo of scar management strategies.  Baseline: new to outpt OT  Goal Status: INITIAL  4. Pt will verbalize understanding of edema management and home modality to decrease pain, decrease edema, and decrease stiffness of affected UE.  Baseline: new ot outpt OT  Goal status: INITIAL  5.  Pt will demo understanding of desensitization strategies of affected UE. Baseline: hypersensitivity of R hand Goal status: INITIAL  6.  Pt will demo understanding of adaptive strategies and A/E options PRN to improve ind for functional ADL/IADL tasks, including but not limited to childcare tasks.  Baseline: limited d/t RUE soreness, compensating with LUE Goal status: INITIAL  LONG TERM GOALS: Target date: 06/01/23  Strength goal TBD pending protocol progression and precautions. Baseline: Strength not tested d/t current precautions. Goal status: INITIAL  2.  9-hole  peg FM goal TBD pending protocol progression and precautions. Baseline: FM coordination not tested d/t  current precautions. Goal status: INITIAL  3.  Pt will demo functional AROM to complete tip pinch, 3-point pinch, and lateral pinch as needed for ADL/IADL tasks using RUE.  Baseline: Limited AROM secondary to recent surgery and current precautions Goal status: INITIAL  4.  Pt will demo [facility] wrist AROM as needed to complete ADL/IADL tasks using RUE. Baseline:  Goal status: INITIAL  ASSESSMENT:  CLINICAL IMPRESSION: Patient is a 28 y.o. female who was seen today for occupational therapy evaluation for S/p De'Quervains release on 03/23/23,  Pain in right wrist. Hx includes anemia, sciatica, chlamydia, Suzette Battiest s/p Berline Lopes release 03/23/23, current episode of major depressive disorder without prior episode 12/24, postpartum depression. Pt demo'd some difficulty attending to precautions today secondary to need to complete childcare tasks though verbalized good understanding of education and attended to precautions to best of ability following education. Patient currently presents below baseline level of functioning demonstrating functional deficits and impairments as noted below. Pt would benefit from skilled OT services in the outpatient setting to work on impairments as noted below to help pt return to PLOF as able.     PERFORMANCE DEFICITS: in functional skills including ADLs, IADLs, coordination, dexterity, proprioception, sensation, edema, ROM, strength, pain, flexibility, Fine motor control, Gross motor control, body mechanics, endurance, wound, skin integrity, and UE functional use, cognitive skills including energy/drive, and psychosocial skills including environmental adaptation.   IMPAIRMENTS: are limiting patient from ADLs, IADLs, rest and sleep, work, play, leisure, and social participation.   COMORBIDITIES: may have co-morbidities  that affects occupational performance. Patient  will benefit from skilled OT to address above impairments and improve overall function.  MODIFICATION OR ASSISTANCE TO COMPLETE EVALUATION: Min-Moderate modification of tasks or assist with assess necessary to complete an evaluation.  OT OCCUPATIONAL PROFILE AND HISTORY: Detailed assessment: Review of records and additional review of physical, cognitive, psychosocial history related to current functional performance.  CLINICAL DECISION MAKING: Moderate - several treatment options, min-mod task modification necessary  REHAB POTENTIAL: Good  EVALUATION COMPLEXITY: Moderate      PLAN:  OT FREQUENCY: 2x/week  OT DURATION: 6 weeks (dates extended to allow for scheduling)  PLANNED INTERVENTIONS: 95638 OT Re-evaluation, 97535 self care/ADL training, 75643 therapeutic exercise, 97530 therapeutic activity, 97112 neuromuscular re-education, 97140 manual therapy, 97035 ultrasound, 97018 paraffin, 32951 fluidotherapy, 97032 electrical stimulation (manual), 97014 electrical stimulation unattended, 97760 Orthotics management and training, 88416 Splinting (initial encounter), [id number] Subsequent splinting/medication, scar mobilization, passive range of motion, functional mobility training, compression bandaging, visual/perceptual remediation/compensation, energy conservation, patient/family education, and DME and/or AE instructions  RECOMMENDED OTHER SERVICES: n/a  CONSULTED AND AGREED WITH PLAN OF CARE: Patient  PLAN FOR NEXT SESSION:  Review Specific goals with pt Assess fit of splint- adjustments PRN Progress per protocol: add additional exercises to HEP, review tendon glides, review precautions  For all possible CPT codes, reference the Planned Interventions line above.     Check all conditions that are expected to impact treatment: {Conditions expected to impact treatment:Musculoskeletal disorders   If treatment provided at initial evaluation, no treatment charged due to lack of  authorization.        Wynetta Emery, OT 04/06/2023, 6:09 PM

## 2023-04-06 NOTE — Patient Instructions (Addendum)
   WEARING SCHEDULE:  Wear splint at ALL times except for hygiene care (May remove splint for exercises and then immediately place back on ONLY if directed by the therapist)  PURPOSE:  To prevent movement and for protection until injury can heal  CARE OF SPLINT:  Keep splint away from heat sources including: stove, radiator or furnace, or a car in sunlight. The splint can melt and will no longer fit you properly  Keep away from pets and children  Clean the splint with rubbing alcohol 1-2 times per day.  * During this time, make sure you also clean your hand/arm as instructed by your therapist and/or perform dressing changes as needed. Then dry hand/arm completely before replacing splint. (When cleaning hand/arm, keep it immobilized in same position until splint is replaced)  PRECAUTIONS/POTENTIAL PROBLEMS: *If you notice or experience increased pain, swelling, numbness, or a lingering reddened area from the splint: Contact your therapist immediately by calling [phone]. You must wear the splint for protection, but we will get you scheduled for adjustments as quickly as possible.  (If only straps or hooks need to be replaced and NO adjustments to the splint need to be made, just call the office ahead and let them know you are coming in)  If you have any medical concerns or signs of infection, please call your doctor immediately

## 2023-04-06 NOTE — Progress Notes (Signed)
 BH MD Outpatient Progress Note  04/06/2023 12:22 PM Shirley Greene  MRN:  [id number]  Assessment:  Shirley Greene presents for follow-up evaluation. Today, 04/06/23, patient reports she had been experiencing relative stability of mood and anxiety up until receiving hand surgery that significantly limited independence and ability to provide typical childcare. This has led to more easy overwhelm and sleep disruption. She is hopeful these symptoms will abate as she recovers and is due to have hand split removed today. She was amenable to initiation of PRN anxiolytic during this period of acute stress however standing psychotropic does not appear indicated at this time given situational nature of symptoms. She reports significant benefit from psychotherapy for mood symptoms thus far.  RTC in approx. 2 months by video.  Identifying Information: Shirley Greene is a 28 y.o. [phone] female with a history of postpartum depression,  iron deficiency anemia, and gestational diabetes who is an established patient with [facility]. On initial evaluation, patient denied prior psychiatric history until 2 months postpartum (s/p delivery Nov 2023) following distressing hospital course requiring emergent C section at 33 weeks, NICU stay, and hysterectomy. She reports that being away from 2 older children elicited significant anxiety and since has felt heightened sense of anxiety when separated from children. Fortunately, she reports that she and baby boy are doing well physically. In the months subsequent to these events, she endorses onset of low mood, decreased energy, feelings of guilt, easy overwhelm, and disruption to sleep and appetite. Denies SI/HI and no acute safety concerns. She states these symptoms have been gradually improving over time especially as middle child has started preschool. Her main complaint since that time is loss of sense of self, feeling that majority of her time and  energy is invested in others with little time for self care. She has opted to focus on psychotherapy and behavioral changes for time being and have deferred initiation of standing psychotropic.  Plan:  # Postpartum depression, currently mild # Anxiety Past medication trials: none Status of problem: recent exacerbation Interventions: -- START Atarax 10 mg daily PRN anxiety/sleep -- Risks, benefits, and side effects including but not limited to dizziness, sedation were reviewed with informed consent provided -- Does not appear to be indication to start standing psychotropic at this time -- Patient opts to focus on psychotherapy for time being and reassess need for medications at next visit -- R/o medical contributors: will reorder CBC, CMP, TSH, Vitamin D as patient was dehydrated impacting last lab draw; ordered for [facility] -- Continue individual psychotherapy with Richardson Dopp LCSW -- Patient is no longer breastfeeding  Patient was given contact information for behavioral health clinic and was instructed to call 911 for emergencies.   Subjective:  Chief Complaint:  Chief Complaint  Patient presents with   Medication Management   Interval History:   Shirley Greene reports things have been rough lately with recovery from hand surgery. Didn't realize how handicapped she would be from surgery. Boyfriend was able to take off and provide a lot of support. Hopeful she will get splint off today and will get removable brace. Will be able to do a lot more independently.   Mentally, was experiencing elevated anxiety related to leaning on others and not being as independent as she likes to be. Reports more difficulty sleeping this past week since boyfriend returned to work; feeling more bothered by small stressors. Getting about 4-5 hours nightly although some nights she gets more. They moved in Jan  which has been nice for extra space but has had some issues with neighbors.  Prior to hand surgery felt  anxiety was well controlled. Mood was much more calm and stable. Wasn't feeling as pressured and overwhelmed. Felt life was flowing more smoothly. Has found it helpful to meet with therapist.   Discussed repeat labs; will obtain at [facility] as she is historically a difficult stick.   Amenable to initiation of Atarax PRN for anxiety/sleep as she navigates this period of acute stress.  Visit Diagnosis:    ICD-10-CM   1. Postpartum depression  F53.0 CBC w/Diff/Platelet    Comprehensive Metabolic Panel (CMET)    TSH    Vitamin D (25 hydroxy)    2. GAD (generalized anxiety disorder)  F41.1       Past Psychiatric History:  Diagnoses: recent diagnosis of postpartum depression Medication trials: denies Previous psychiatrist/therapist: denies Hospitalizations: denies Suicide attempts: denies SIB: denies Hx of violence towards others: denies Current access to guns: denies Hx of trauma/abuse: denies Substance use:              -- Etoh: every few months; approx. 1-2 drinks in a sitting             -- Tobacco: denies             -- Cannabis/CBD/THC: denies             -- Illicit drugs: denies  Past Medical History:  Past Medical History:  Diagnosis Date   Alpha thalassemia silent carrier 05/27/2018   Anemia    Cervical dysplasia 02/15/2017   02/2018: pap neg   Chlamydia    GDM (gestational diabetes mellitus), class A1 07/11/2021   Infection    UTI   Postpartum depression    Sciatica 07/16/2018    Past Surgical History:  Procedure Laterality Date   CESAREAN SECTION N/A 04/05/2012   Procedure: CESAREAN SECTION;  Surgeon: Tilda Burrow, MD;  Location: [facility];  Service: Obstetrics;  Laterality: N/A;   CESAREAN SECTION WITH BILATERAL TUBAL LIGATION N/A 11/28/2018   Procedure: cesarean section;  Surgeon: Hermina Staggers, MD;  Location: [facility];  Service: Obstetrics;  Laterality: N/A;   COLPOSCOPY  11/28/2022   DILATION AND EVACUATION N/A 11/19/2020   Procedure: DILATATION AND  EVACUATION;  Surgeon: Myna Hidalgo, DO;  Location: [facility] OR;  Service: Gynecology;  Laterality: N/A;   MINOR RELEASE DORSAL COMPARTMENT (DEQUERVAINS) Right 03/23/2023   Procedure: RIGHT WRIST FIRST EXTENSOR COMPARTMENT RELEASE  (DEQUERVAINS);  Surgeon: Samuella Cota, MD;  Location: [facility];  Service: Orthopedics;  Laterality: Right;  RNFA   OPERATIVE ULTRASOUND N/A 11/19/2020   Procedure: OPERATIVE ULTRASOUND;  Surgeon: Myna Hidalgo, DO;  Location: [facility] OR;  Service: Gynecology;  Laterality: N/A;    Family Psychiatric History: denies  Family History:  Family History  Problem Relation Age of Onset   Hypertension Mother    Hypertension Father    Cancer Maternal Grandfather     Social History:  Academic/Vocational: completed high school and cosmetology school; self-employed as Associate Professor   Social History   Socioeconomic History   Marital status: Single    Spouse name: Not on file   Number of children: Not on file   Years of education: Not on file   Highest education level: Not on file  Occupational History   Not on file  Tobacco Use   Smoking status: Never   Smokeless tobacco: Never  Vaping Use   Vaping status: Never  Used  Substance and Sexual Activity   Alcohol use: Yes    Comment: every few months   Drug use: No   Sexual activity: Not Currently    Birth control/protection: None  Other Topics Concern   Not on file  Social History Narrative   Not on file   Social Drivers of Health   Financial Resource Strain: Low Risk  (12/19/2022)   Overall Financial Resource Strain (CARDIA)    Difficulty of Paying Living Expenses: Not hard at all  Food Insecurity: No Food Insecurity (12/19/2022)   Hunger Vital Sign    Worried About Running Out of Food in the Last Year: Never true    Ran Out of Food in the Last Year: Never true  Transportation Needs: No Transportation Needs (12/19/2022)   PRAPARE - Administrator, Civil Service (Medical): No    Lack  of Transportation (Non-Medical): No  Physical Activity: Inactive (12/19/2022)   Exercise Vital Sign    Days of Exercise per Week: 0 days    Minutes of Exercise per Session: 0 min  Stress: Stress Concern Present (12/19/2022)   [employer] of Occupational Health - Occupational Stress Questionnaire    Feeling of Stress : To some extent  Social Connections: Moderately Isolated (12/19/2022)   Social Connection and Isolation Panel [NHANES]    Frequency of Communication with Friends and Family: More than three times a week    Frequency of Social Gatherings with Friends and Family: Once a week    Attends Religious Services: Never    Database administrator or Organizations: No    Attends Engineer, structural: Never    Marital Status: Living with partner    Allergies: No Known Allergies  Current Medications: Current Outpatient Medications  Medication Sig Dispense Refill   hydrOXYzine (ATARAX) 10 MG tablet Take 1 tablet (10 mg total) by mouth daily as needed for anxiety (or sleep). 30 tablet 1   ondansetron (ZOFRAN-ODT) 4 MG disintegrating tablet Take 1 tablet (4 mg total) by mouth every 8 (eight) hours as needed for nausea or vomiting. 20 tablet 0   oxyCODONE (ROXICODONE) 5 MG immediate release tablet Take 1 tablet (5 mg total) by mouth every 6 (six) hours as needed for severe pain (pain score 7-10). (Patient not taking: Reported on 04/06/2023) 20 tablet 0   No current facility-administered medications for this visit.    ROS: Reports hand pain s/p hand surgery  Objective:  Psychiatric Specialty Exam: There were no vitals taken for this visit.There is no height or weight on file to calculate BMI.  General Appearance: Casual and Well Groomed  Eye Contact:  Good  Speech:  Clear and Coherent and Normal Rate  Volume:  Normal  Mood:   "anxious"  Affect:   Euthymic; calm; pleasant  Thought Content:  Denies AVH; no delusional thought content on interview      Suicidal Thoughts:   No  Homicidal Thoughts:  No  Thought Process:  Goal Directed and Linear  Orientation:  Full (Time, Place, and Person)    Memory:  Grossly intact   Judgment:  Good  Insight:  Good  Concentration:  Concentration: Good  Recall:  not formally assessed   Fund of Knowledge: Good  Language: Good  Psychomotor Activity:  Normal  Akathisia:  NA  AIMS (if indicated): NA  Assets:  Communication Skills Desire for Improvement Housing Intimacy Leisure Time Physical Health Resilience Social Support Talents/Skills Transportation  ADL's:  Intact  Cognition: WNL  Sleep:   recently disrupted   PE: General: sits comfortably in view of camera; no acute distress  Pulm: no increased work of breathing on room air  MSK: all extremity movements appear intact  Neuro: no focal neurological deficits observed  Gait & Station: unable to assess by video    Metabolic Disorder Labs: Lab Results  Component Value Date   HGBA1C 5.9 (H) 06/30/2021   No results found for: "PROLACTIN" No results found for: "CHOL", "TRIG", "HDL", "CHOLHDL", "VLDL", "LDLCALC" Lab Results  Component Value Date   TSH CANCELED 03/12/2023   TSH 3.290 06/30/2021    Therapeutic Level Labs: No results found for: "LITHIUM" No results found for: "VALPROATE" No results found for: "CBMZ"  Screenings:  GAD-7    Flowsheet Row Counselor from 12/19/2022 in [facility]  Total GAD-7 Score 10      PHQ2-9    Flowsheet Row Counselor from 12/19/2022 in [facility] Nutrition from 07/27/2021 in [facility]  - [facility]  PHQ-2 Total Score 2 0  PHQ-9 Total Score 7 --      Flowsheet Row Admission (Discharged) from 03/23/2023 in [facility] from [facility]/12/2023 in [facility] from 12/19/2022 in [facility]  C-SSRS RISK CATEGORY No Risk No Risk No Risk        Collaboration of Care: Collaboration of Care: Medication Management AEB active medication management, Psychiatrist AEB established with this provider, and Referral or follow-up with counselor/therapist AEB established with individual psychotherapy  Patient/Guardian was advised Release of Information must be obtained prior to any record release in order to collaborate their care with an outside provider. Patient/Guardian was advised if they have not already done so to contact the registration department to sign all necessary forms in order for [place] to release information regarding their care.   Consent: Patient/Guardian gives verbal consent for treatment and assignment of benefits for services provided during this visit. Patient/Guardian expressed understanding and agreed to proceed.   Televisit via video: I connected with patient on 04/06/23 at  9:00 AM EDT by a video enabled telemedicine application and verified that I am speaking with the correct person using two identifiers.  Location: Patient: home address in [place] Provider: remote office in [place]   I discussed the limitations of evaluation and management by telemedicine and the availability of in person appointments. The patient expressed understanding and agreed to proceed.  I discussed the assessment and treatment plan with the patient. The patient was provided an opportunity to ask questions and all were answered. The patient agreed with the plan and demonstrated an understanding of the instructions.   The patient was advised to call back or seek an in-person evaluation if the symptoms worsen or if the condition fails to improve as anticipated.  I provided 30 minutes dedicated to the care of this patient via video on the date of this encounter to include chart review, face-to-face time with the patient, medication management/counseling, documentation, brief therapeutic support.  Tavionna Grout A Cicilia Clinger 04/06/2023, 12:22 PM

## 2023-04-06 NOTE — Patient Instructions (Signed)
 Thank you for attending your appointment today.  -- START Atarax 10 mg daily as needed for anxiety/sleep -- Continue other medications as prescribed.  Please do not make any changes to medications without first discussing with your provider. If you are experiencing a psychiatric emergency, please call 911 or present to your nearest emergency department. Additional crisis, medication management, and therapy resources are included below.  [facility]  [address], [place], [place] [address] [phone] WALK-IN URGENT CARE 24/7 FOR ANYONE [address], [place], [place]  [phone] Fax: [phone] [email or web address] *Interpreters available *Accepts all insurance and uninsured for Urgent Care needs *Accepts Medicaid and uninsured for outpatient treatment (below)      ONLY FOR [facility]  Below:    Outpatient New Patient Assessment/Therapy Walk-ins:        Monday, Wednesday, and Thursday 8am until slots are full (first come, first served)                   New Patient Psychiatry/Medication Management        Monday-Friday 8am-11am (first come, first served)               For all walk-ins we ask that you arrive by 7:15am, because patients will be seen in the order of arrival.

## 2023-04-09 ENCOUNTER — Ambulatory Visit (INDEPENDENT_AMBULATORY_CARE_PROVIDER_SITE_OTHER): Admitting: Orthopedic Surgery

## 2023-04-09 DIAGNOSIS — M654 Radial styloid tenosynovitis [de Quervain]: Secondary | ICD-10-CM

## 2023-04-09 NOTE — Progress Notes (Signed)
   MERLA SAWKA - 28 y.o. female MRN [id number]  Date of birth: November 22, 1995  Office Visit Note: Visit Date: 04/09/2023 PCP: Patient, No Pcp Per Referred by: No ref. provider found  Subjective:  HPI: Shirley Greene is a 28 y.o. female who presents today for follow up 2 weeks status post right wrist first extensor compartment release.  Her pain and sensitivity is improving, she has been seen by occupational therapy was fabricated her thumb spica orthosis as instructed.  She has begin range of motion exercises of the digits.  Pertinent ROS were reviewed with the patient and found to be negative unless otherwise specified above in HPI.   Assessment & Plan: Visit Diagnoses:  1. De Quervain's disease (radial styloid tenosynovitis)     Plan: She continues to do well postoperatively.  I am pleased to see that her pain and hypersensitivity is improving.  Her orthosis is well-fitting.  I did explain that she can begin range of motion exercises of the wrist at this point, utilize the orthosis when not doing exercise for additional 2 weeks.  Continue with digital range of motion exercises as instructed.  Follow-up with myself in approximate 4 weeks to track her progress.  Follow-up: No follow-ups on file.   Meds & Orders: No orders of the defined types were placed in this encounter.  No orders of the defined types were placed in this encounter.    Procedures: No procedures performed       Objective:   Vital Signs: LMP  (LMP Unknown)   Ortho Exam Right wrist with well-healing dorsal radial incision, suture tails were trimmed today, skin edges well-approximated without evidence of erythema or drainage, gentle wrist circumduction and thumb circumduction without significant pain, no evidence of ongoing instability at the first extensor compartment tendons  Imaging: No results found.   Maydell Knoebel Trevor Mace, M.D. [facility], Hand Surgery

## 2023-04-10 ENCOUNTER — Ambulatory Visit: Admitting: Occupational Therapy

## 2023-04-12 ENCOUNTER — Ambulatory Visit: Admitting: Occupational Therapy

## 2023-04-12 DIAGNOSIS — M6281 Muscle weakness (generalized): Secondary | ICD-10-CM

## 2023-04-12 DIAGNOSIS — R29898 Other symptoms and signs involving the musculoskeletal system: Secondary | ICD-10-CM

## 2023-04-12 DIAGNOSIS — M25531 Pain in right wrist: Secondary | ICD-10-CM

## 2023-04-12 DIAGNOSIS — R208 Other disturbances of skin sensation: Secondary | ICD-10-CM

## 2023-04-12 DIAGNOSIS — M79641 Pain in right hand: Secondary | ICD-10-CM

## 2023-04-12 NOTE — Patient Instructions (Signed)
 Desensitization Techniques  Perform these exercises ever 2 hours for 15 minute sessions.  Progress to the next exercises when the exercises you are doing become easy.  1)  Using light pressure, rub the various textures along with the hypersensitive area:  A.  Flannel  E.  Polyester  B.  Velvet  F.  Corduroy  C.  Wool  G.  Cotton material  D.  Terry cloth  2)  With the same textures use a firmer pressure.  Scar Massage Purpose: To soften/smooth scar tissue.   To desensitize sensitive areas after surgery.   To mechanically break up inner scar tissue, adhesions, therefore allowing freer        movement of injured tendons and muscle.  Technique: Use a cream to massage with, as it insures a smooth gliding motion and avoids irritation  caused by rubbing skin to skin.  Cream is preferred over a lotion.   May begin as soon as any suture areas are healed.   Apply a firm, steady pressure with your finger-tip, pulling the skin in a circular motion over the scarred area.  Do not rub.  Message 5 minutes, at least 2 times a day, unless you are getting tender afterwards   AROM: Wrist Extension    With right palm down, bend wrist up. Repeat _15___ times per set. Do _3-4___ sessions per day.   AROM: Wrist Radial / Ulnar Deviation    Gently bend left wrist from side to side as far as possible. Repeat _15___ times per set.  Do __3-4__ sessions per day.   Opposition (Active)    Touch tip of thumb to nail tip of each finger in turn, making an "O" shape. Repeat ___5_ times to each finger. Do __3-4__ sessions per day.   MP Flexion (Active)    Bend thumb to touch base of long to little finger, keeping tip joint straight. Repeat __15__ times. Do _3-4___ sessions per day.  Radial Adduction/Abduction (Active)    Move thumb out to side. Move back alongside index finger. Repeat __15__ times. Do __3-4__ sessions per day.  Palmar Adduction/Abduction (Active)    Move thumb away in  front of palm. Move back to rest along palm. Repeat _15___ times. Do _3-4___ sessions per day.

## 2023-04-12 NOTE — Therapy (Signed)
 OUTPATIENT OCCUPATIONAL THERAPY ORTHO TREATMENT  Patient Name: Shirley Greene MRN: [id number] DOB:1995/02/16, 28 y.o., female Today's Date: 04/12/2023  PCP: No PCP per pt report REFERRING PROVIDER: Samuella Cota, MD   END OF SESSION:  OT End of Session - 04/12/23 0854     Visit Number 2    Number of Visits 13   including eval   Date for OT Re-Evaluation 06/01/23    Authorization Type [employer], Auth Required, VL: 27    OT Start Time 0853    OT Stop Time 0931    OT Time Calculation (min) 38 min    Activity Tolerance Patient limited by pain    Behavior During Therapy [facility] for tasks assessed/performed;Anxious             Past Medical History:  Diagnosis Date   Alpha thalassemia silent carrier 05/27/2018   Anemia    Cervical dysplasia 02/15/2017   02/2018: pap neg   Chlamydia    GDM (gestational diabetes mellitus), class A1 07/11/2021   Infection    UTI   Postpartum depression    Sciatica 07/16/2018   Past Surgical History:  Procedure Laterality Date   CESAREAN SECTION N/A 04/05/2012   Procedure: CESAREAN SECTION;  Surgeon: Tilda Burrow, MD;  Location: [facility];  Service: Obstetrics;  Laterality: N/A;   CESAREAN SECTION WITH BILATERAL TUBAL LIGATION N/A 11/28/2018   Procedure: cesarean section;  Surgeon: Hermina Staggers, MD;  Location: [facility];  Service: Obstetrics;  Laterality: N/A;   COLPOSCOPY  11/28/2022   DILATION AND EVACUATION N/A 11/19/2020   Procedure: DILATATION AND EVACUATION;  Surgeon: Myna Hidalgo, DO;  Location: [facility] OR;  Service: Gynecology;  Laterality: N/A;   MINOR RELEASE DORSAL COMPARTMENT (DEQUERVAINS) Right 03/23/2023   Procedure: RIGHT WRIST FIRST EXTENSOR COMPARTMENT RELEASE  (DEQUERVAINS);  Surgeon: Samuella Cota, MD;  Location: [facility];  Service: Orthopedics;  Laterality: Right;  RNFA   OPERATIVE ULTRASOUND N/A 11/19/2020   Procedure: OPERATIVE ULTRASOUND;  Surgeon: Myna Hidalgo, DO;  Location: [facility];  Service:  Gynecology;  Laterality: N/A;   Patient Active Problem List   Diagnosis Date Noted   Suzette Battiest disease (radial styloid tenosynovitis) 03/23/2023   Postpartum depression    Current moderate episode of major depressive disorder without prior episode (HCC) 12/19/2022   History of hysterectomy leaving cervix intact 01/04/2022   Anemia affecting pregnancy, antepartum 11/14/2021   ASCUS with positive high risk HPV cervical on 04/05/2021 04/10/2021   Alpha thalassemia silent carrier 05/27/2018    ONSET DATE: 03/27/2023 (referral date), S/p De'Quervains release on 03/23/23, OT 2 week visit for ROM  REFERRING DIAG: M25.531 (ICD-10-CM) - Pain in right wrist   THERAPY DIAG:  Pain in right wrist  Pain in right hand  Other disturbances of skin sensation  Other symptoms and signs involving the musculoskeletal system  Muscle weakness (generalized)  Rationale for Evaluation and Treatment: Rehabilitation  SUBJECTIVE:   SUBJECTIVE STATEMENT: Pt almost 3 weeks s/p release today.  Pt with no pain today and decreased sensitivity  Pt accompanied by: self   PERTINENT HISTORY: anemia, sciatica, chlamydia, Suzette Battiest s/p Berline Lopes release 03/23/23, current episode of major depressive disorder without prior episode 12/24, postpartum depression  PRECAUTIONS: Other:   Gentle ROM per protocol at 2-week interval, progress per [facility] Hand Protocol 5th Edition  Per Dr. Fara Boros (03/29/23) : Pt demo'd some tendon instability intra op., fabricate forearm-based thumb spica splint then convert to hand-based at 4 weeks, recommended to  remove splint for nerve and tendon glides to target hypersensitivity  RED FLAGS: Per Dr. Fara Boros (03/29/23):  Hypersensitivity concerns, tendon instability intra op    WEIGHT BEARING RESTRICTIONS: Yes, per [place] 5th Edition Protocol  PAIN:  Are you having pain? Yes: NPRS scale: 0/10 at rest, 7/10 at worst today (throbbing which resolved with rest) when hand out of  splint/protective coverings and pt attempted to move around room for childcare Pain location: over incision site, over lateral side of wrist Pain description: throbbing when pain occurs, which resolves with rest Aggravating factors: movement Relieving factors: rest  FALLS: Has patient fallen in last 6 months? No  LIVING ENVIRONMENT: Lives with: lives with their family Lives in: House/apartment  PLOF: ind  PATIENT GOALS: "to get back to normal"  NEXT MD VISIT: Dr. Fara Boros: 04/09/23  OBJECTIVE:  Note: Objective measures were completed at Evaluation unless otherwise noted.  HAND DOMINANCE: Right  ADLs: Eating: sore R hand, compensating with L hand PRN Grooming: sore R hand, compensating with L hand PRN Upper body dressing: sore R hand, compensating with L hand  PRN Lower body dressing: sore R hand, compensating with L hand  PRN Toileting: sore R hand, compensating with L hand PRN Bathing: sore R hand, compensating with L hand PRN  FUNCTIONAL OUTCOME MEASURES: QuickDash: 61.4% deficit     UPPER EXTREMITY ROM:     AROM - to be further assessed. AROM limited secondary to current precautions.   Active ROM Right eval Left eval  Shoulder flexion    Shoulder abduction    Shoulder adduction    Shoulder extension    Shoulder internal rotation    Shoulder external rotation    Elbow flexion    Elbow extension    Wrist flexion    Wrist extension    Wrist ulnar deviation    Wrist radial deviation    Wrist pronation    Wrist supination    (Blank rows = not tested)  Active ROM Right eval Left eval  Thumb MCP (0-60)    Thumb IP (0-80)    Thumb Radial abd/add (0-55)     Thumb Palmar abd/add (0-45)     Thumb Opposition to Small Finger     Index MCP (0-90)     Index PIP (0-100)     Index DIP (0-70)      Long MCP (0-90)      Long PIP (0-100)      Long DIP (0-70)      Ring MCP (0-90)      Ring PIP (0-100)      Ring DIP (0-70)      Little MCP (0-90)      Little PIP  (0-100)      Little DIP (0-70)      (Blank rows = not tested)  Digits 2-5 AROM - WFL  HAND FUNCTION: Grip strength: Not tested per precautions  COORDINATION: 9 Hole Peg test: Not tested per precautions  SENSATION: Pt reported tingling of R thumb.  EDEMA: mild R wrist/hand  COGNITION: Overall cognitive status: Within functional limits for tasks assessed  OBSERVATIONS: Pt ambulated ind without A/E. Pt was pleasant. Pt expressed concerns about long healing process expected after surgery d/t need to care for children and other daily activities. Pt accompanied by young son in stroller today. Pt presented to OT session today with bulky compressive covering at R wrist and hand and Xeroform over surgical site. OT removed Xeroform and noted surgical site appeared to be healing  well with no apparent signs of infection. Pt demo'd some difficulty attending to precautions secondary to need to complete childcare tasks during evaluation today though pt attended to precautions to best of ability following education.    TREATMENT DATE: 04/12/23                                                                                                                            Pt reports splint fitting well w/ no issues.   Pt issued wrist and thumb A/ROM HEP - see pt instructions for details. Pt performed each x 15 reps. Pt instructed she can progress to PROM to thumb/wrist in 2-3 days per tolerance (pt also given pictures of ex's for DeQuervains release from Vol 3 of [place] protocol) Pt instructed to continue wearing splint b/t exercises and at night for additional 2 weeks per MD recommendations and d/t pt having small children.   Pt also educated in desensitization techniques and scar massage - see pt instructions for details. Therapist demo scar massage - recommended not adding cream to scar massage for another week.   Pt also issued gel padding to apply at night under compression stockinette (tensogrip - also  provided today) and splint to help flatten scar. Pt verbalized understanding   PATIENT EDUCATION: Education details: see today's tx above Person educated: Patient Education method: Explanation Education comprehension: verbalized understanding  HOME EXERCISE PROGRAM: 04/06/23 - tendon glides, splint wear/care 04/12/23: HEP for wrist/thumb, desensitization and scar massage  GOALS: Goals reviewed with patient? Yes  SHORT TERM GOALS: Target date: 05/04/23  Pt will be ind with initial HEP using visual handouts.  Baseline: new to outpt OT, tendon glides issued Goal status: IN PROGRESS  2.  Pt will demo understanding of splint wear/care schedule and ind don/doff splint. Baseline: splint fabricated at eval Goal status: MET  3. Pt will return demo of scar management strategies.  Baseline: new to outpt OT  Goal Status: IN PROGRESS  4. Pt will verbalize understanding of edema management and home modality to decrease pain, decrease edema, and decrease stiffness of affected UE.  Baseline: new ot outpt OT  Goal status: IN PROGRESS  5.  Pt will demo understanding of desensitization strategies of affected UE. Baseline: hypersensitivity of R hand Goal status: MET   6.  Pt will demo understanding of adaptive strategies and A/E options PRN to improve ind for functional ADL/IADL tasks, including but not limited to childcare tasks.  Baseline: limited d/t RUE soreness, compensating with LUE Goal status: INITIAL  LONG TERM GOALS: Target date: 06/01/23  Strength goal TBD pending protocol progression and precautions. Baseline: Strength not tested d/t current precautions. Goal status: INITIAL  2.  9-hole peg FM goal TBD pending protocol progression and precautions. Baseline: FM coordination not tested d/t current precautions. Goal status: INITIAL  3.  Pt will demo functional AROM to complete tip pinch, 3-point pinch, and lateral pinch as needed for ADL/IADL tasks using RUE.  Baseline: Limited  AROM secondary to recent surgery and current precautions Goal status: INITIAL  4.  Pt will demo [facility] wrist AROM as needed to complete ADL/IADL tasks using RUE. Baseline:  Goal status: INITIAL  ASSESSMENT:  CLINICAL IMPRESSION: Patient seen today for occupational therapy treatment for S/p De'Quervains release on 03/23/23,  Pain in right wrist. Hx includes anemia, sciatica, chlamydia, Suzette Battiest s/p Berline Lopes release 03/23/23, current episode of major depressive disorder without prior episode 12/24, postpartum depression. Pt with no pain today and less hypersensitivity. Tolerating progression of protocol well today. Patient currently presents below baseline level of functioning demonstrating functional deficits and impairments as noted below. Pt would benefit from skilled OT services in the outpatient setting to work on impairments as noted below to help pt return to PLOF as able.     PERFORMANCE DEFICITS: in functional skills including ADLs, IADLs, coordination, dexterity, proprioception, sensation, edema, ROM, strength, pain, flexibility, Fine motor control, Gross motor control, body mechanics, endurance, wound, skin integrity, and UE functional use, cognitive skills including energy/drive, and psychosocial skills including environmental adaptation.   IMPAIRMENTS: are limiting patient from ADLs, IADLs, rest and sleep, work, play, leisure, and social participation.   COMORBIDITIES: may have co-morbidities  that affects occupational performance. Patient will benefit from skilled OT to address above impairments and improve overall function.  MODIFICATION OR ASSISTANCE TO COMPLETE EVALUATION: Min-Moderate modification of tasks or assist with assess necessary to complete an evaluation.  OT OCCUPATIONAL PROFILE AND HISTORY: Detailed assessment: Review of records and additional review of physical, cognitive, psychosocial history related to current functional performance.  CLINICAL DECISION MAKING:  Moderate - several treatment options, min-mod task modification necessary  REHAB POTENTIAL: Good  EVALUATION COMPLEXITY: Moderate      PLAN:  OT FREQUENCY: 2x/week  OT DURATION: 6 weeks (dates extended to allow for scheduling)  PLANNED INTERVENTIONS: 53664 OT Re-evaluation, 97535 self care/ADL training, 40347 therapeutic exercise, 97530 therapeutic activity, 97112 neuromuscular re-education, 97140 manual therapy, 97035 ultrasound, 97018 paraffin, 42595 fluidotherapy, 97032 electrical stimulation (manual), 97014 electrical stimulation unattended, 97760 Orthotics management and training, 63875 Splinting (initial encounter), [id number] Subsequent splinting/medication, scar mobilization, passive range of motion, functional mobility training, compression bandaging, visual/perceptual remediation/compensation, energy conservation, patient/family education, and DME and/or AE instructions  RECOMMENDED OTHER SERVICES: n/a  CONSULTED AND AGREED WITH PLAN OF CARE: Patient  PLAN FOR NEXT SESSION:  Consider fluido for stiffness, desensitization, continue A/ROM and progress to more P/ROM at thumb and wrist.   For all possible CPT codes, reference the Planned Interventions line above.     Check all conditions that are expected to impact treatment: {Conditions expected to impact treatment:Musculoskeletal disorders   If treatment provided at initial evaluation, no treatment charged due to lack of authorization.        Sheran Lawless, OT 04/12/2023, 8:57 AM

## 2023-04-16 ENCOUNTER — Ambulatory Visit (INDEPENDENT_AMBULATORY_CARE_PROVIDER_SITE_OTHER): Payer: Medicaid Other | Admitting: Licensed Clinical Social Worker

## 2023-04-16 DIAGNOSIS — F321 Major depressive disorder, single episode, moderate: Secondary | ICD-10-CM | POA: Diagnosis not present

## 2023-04-16 IMAGING — US US BREAST*L* LIMITED INC AXILLA
1 series · 3 of 3 positions shown · non-contrast
Comparison: None.

CLINICAL DATA: LEFT breast pain. Patient describes generalized pain
within the lower LEFT breast and generalized burning throughout the
LEFT breast.

EXAM:
ULTRASOUND OF THE LEFT BREAST

[Series 1: us breast*left* limited inc axilla · 0.08mm/px · 3 of 3 slices shown]
[im 1/3]
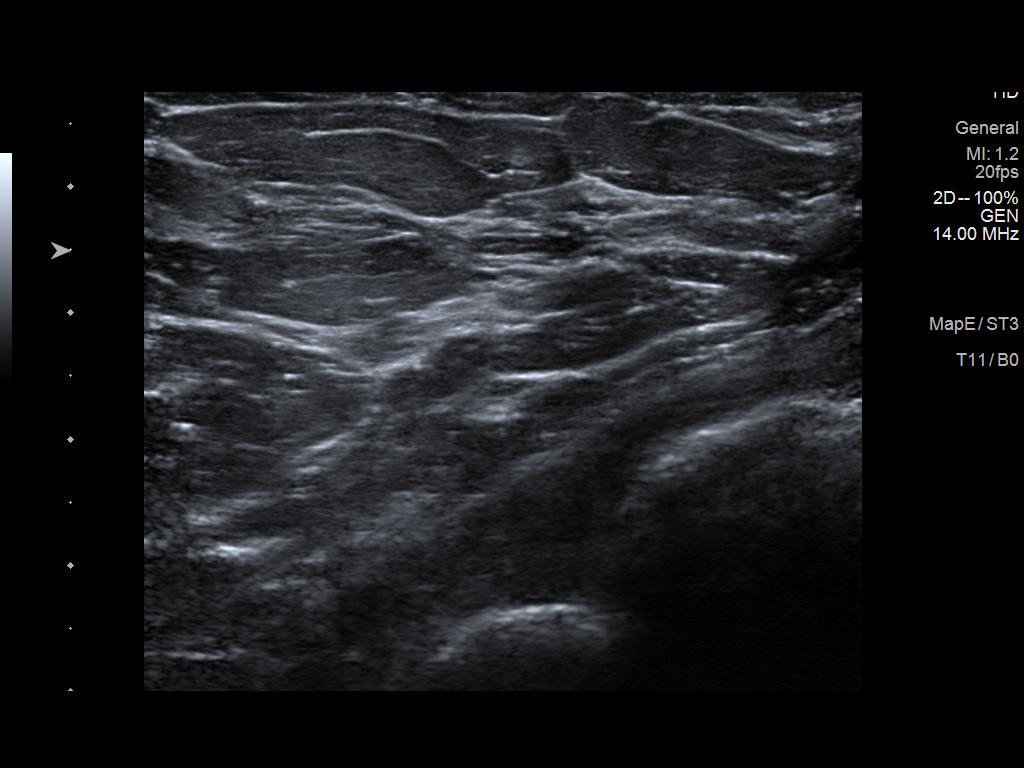
[im 2/3]
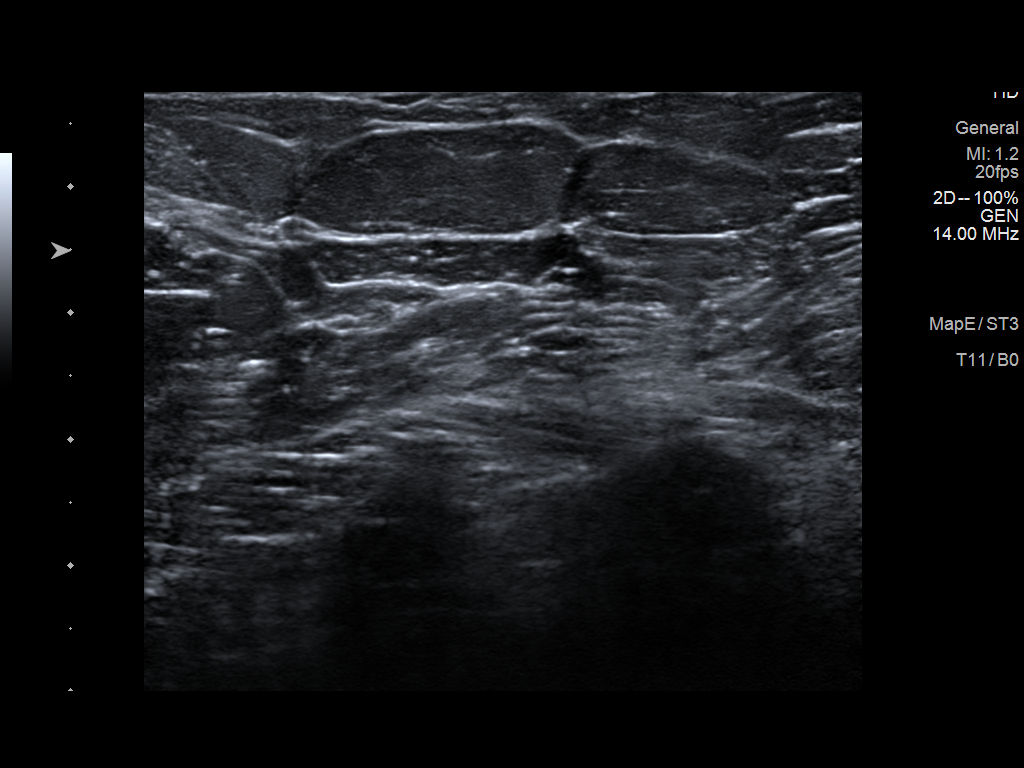
[im 3/3]
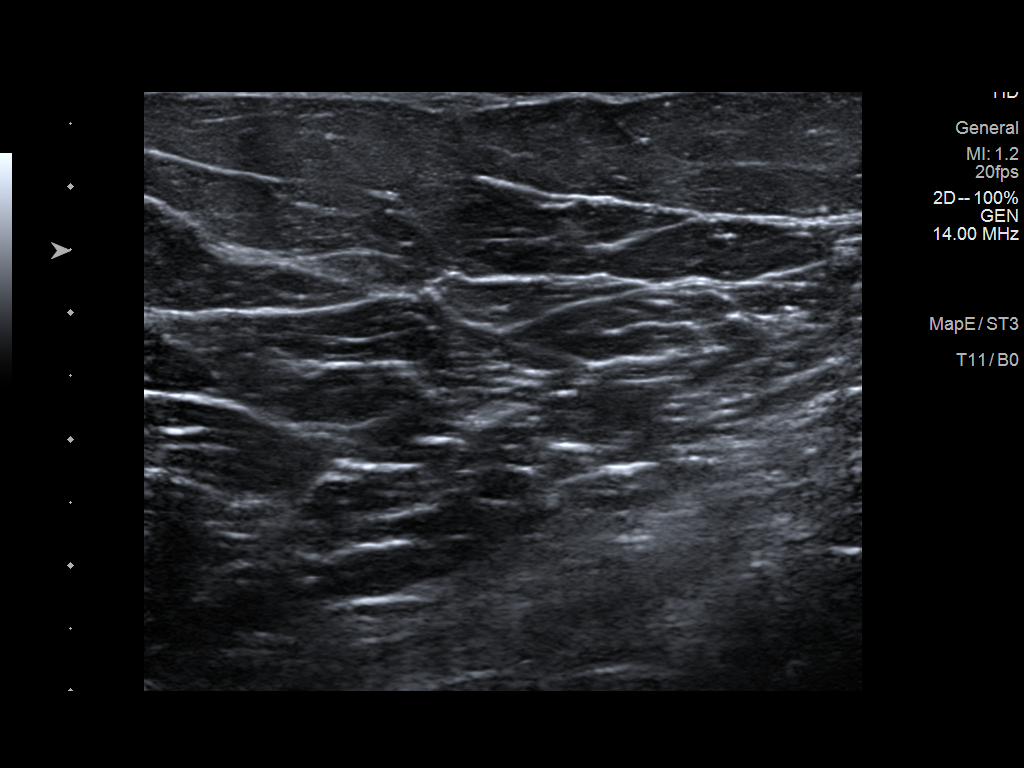

[3 of 3 positions shown; findings below may reference images not displayed]

FINDINGS: Targeted ultrasound is performed, evaluating the lower LEFT breast,
showing only normal fibroglandular tissues and fat lobules
throughout. No solid or cystic mass.
IMPRESSION: No evidence of malignancy.

RECOMMENDATION:
1. Screening mammogram at age 38 unless there are persistent or
intervening clinical concerns. (Code:[id number])
2. Breast pain is a common condition which will often resolve on its
own without intervention. Benign causes of breast pain were
discussed with the patient. Breast pain can be improved by
over-the-counter pain medications such as Aspercreme, oral NSAIDs,
and topical NSAIDs. Studies have shown an improvement with use of
evening primrose oil and Vitamin E. The patient was encouraged to
follow-up with referring physician if the pain persisted or worsened
as this might indicate a need to evaluate the deeper structures of
the underlying chest wall. Patient was instructed to return for
additional imaging if a palpable lump developed.

I have discussed the findings and recommendations with the patient.
If applicable, a reminder letter will be sent to the patient
regarding the next appointment.

BI-RADS CATEGORY  1: Negative.

## 2023-04-16 NOTE — Progress Notes (Signed)
 THERAPIST PROGRESS NOTE  Virtual Visit via Video Note  I connected with Argentina O Treaster on 04/16/23 at  1:00 PM EDT by a video enabled telemedicine application and verified that I am speaking with the correct person using two identifiers.  Location: Patient: [facility]  Provider: Providers Home    I discussed the limitations of evaluation and management by telemedicine and the availability of in person appointments. The patient expressed understanding and agreed to proceed.    I discussed the assessment and treatment plan with the patient. The patient was provided an opportunity to ask questions and all were answered. The patient agreed with the plan and demonstrated an understanding of the instructions.   The patient was advised to call back or seek an in-person evaluation if the symptoms worsen or if the condition fails to improve as anticipated.  I provided 30 minutes of non-face-to-face time during this encounter.   Weber Cooks, LCSW   Participation Level: Active  Behavioral Response: CasualAlertAnxious and Depressed  Type of Therapy: Individual Therapy  Treatment Goals addressed:  Active     Anxiety     STG: Haeley will complete at least 80% of assigned homework  (Progressing)     Start:  12/19/22    Expected End:  07/20/23         STG: [name] will practice problem solving skills 3 times per week for the next 4 weeks.  (Progressing)     Start:  12/19/22    Expected End:  07/20/23         Create 3 coping skills for anxiety     Start:  12/19/22    Expected End:  07/20/23         Notify 3 triggers for anxiety (Progressing)     Start:  12/19/22    Expected End:  07/20/23       Goal Note     Post op surgical syptoms  Financials due to not being able to work           Encourage Santiaga to take psychotropic medication(s) as prescribed     Start:  12/19/22       Intervention Note     Reviewed with patient during session.           Review results of GAD-7 with Eveleigh to track progress     Start:  12/19/22       Intervention Note     Reviewed with patient during session.          Perform psychoeducation regarding anxiety disorders     Start:  12/19/22       Intervention Note     Reviewed with patient during session.          Work with [name] to track symptoms, triggers, and/or skill use through a mood chart, diary card, or journal (Completed)     Start:  12/19/22    End:  04/16/23    Intervention Note     Reviewed with patient during session.          Discuss risks and benefits of medication treatment options for this problem and prescribe as indicated (Completed)     Start:  12/19/22    End:  04/16/23    Intervention Note     Reviewed with patient during session.            OP Depression     LTG: Reduce frequency, intensity, and duration of depression symptoms so that  daily functioning is improved (Progressing)     Start:  12/19/22    Expected End:  07/20/23         LTG: Increase coping skills to manage depression and improve ability to perform daily activities (Progressing)     Start:  12/19/22    Expected End:  07/20/23         LTG: Jakai will score less than 10 on the Patient Health Questionnaire (PHQ-9)  (Progressing)     Start:  12/19/22    Expected End:  07/20/23         STG: [name] will participate in at least 80% of scheduled individual psychotherapy sessions  (Progressing)     Start:  12/19/22    Expected End:  07/20/23         Provide Twyla educational information and reading material on dissociation, its causes, and symptoms     Start:  12/19/22         Tanyiah will identify 3 personal goals for managing depression symptoms to work on during the current treatment episode     Start:  12/19/22         Work with [name] to track symptoms, triggers, and/or skill use through a mood chart, diary card, or journal (Completed)     Start:  12/19/22    End:   04/16/23    Intervention Note     Reviewed with patient during session.          Encourage [place] to participate in recovery peer support activities weekly  (Completed)     Start:  12/19/22    End:  04/16/23      Therapist will administer the PHQ-9 at weekly intervals for the next 8 weeks (Completed)     Start:  12/19/22    End:  04/16/23       ProgressTowards Goals: Progressing  Interventions: CBT, Strength-based, and Supportive    Suicidal/Homicidal: Nowithout intent/plan  Therapist Response:    Patient was alert and oriented x 5.  She was pleasant, cooperative, maintained good eye contact. Sarahmarie presented with anxious mood\affect.  Patient comes in today with primary stressors as illness due to postop surgical symptoms.  She reports that she thought that the surgery would have a 4 week recovery time table and it is turning out to be a 6 to 8-week recovery. Bayyinah reports symptoms for tension, worry, restlessness, and insomnia.  She reports that she struggles with sleep due to not being able to work and getting behind on her finances due to being dependent on being a 2 income household.  Patient reports frustration with the doctors due to not explaining the surgical procedures and recovery time properly. Taleigh reports struggling with other people controlling her life such as physical therapist and surgeons.   Intervention/plan: LCSW utilized psychoanalytic therapy for patient to express thoughts, feelings and concerns and nonjudgmental environment.  LCSW utilized strength-based therapy to review goals with patient.  LCSW spoke with patient about triggers for anxiety such as finances and postop surgical recovery.  LCSW utilized supportive therapy for praise and encouragement.  LCSW spoke with patient about utilization of support systems to help decrease anxiety.  Plan: Return again in 4 weeks.  Diagnosis: Current moderate episode of major depressive disorder without prior  episode (HCC)  Collaboration of Care: Other None today   Patient/Guardian was advised Release of Information must be obtained prior to any record release in order to collaborate their care with an outside provider. Patient/Guardian was  advised if they have not already done so to contact the registration department to sign all necessary forms in order for [place] to release information regarding their care.   Consent: Patient/Guardian gives verbal consent for treatment and assignment of benefits for services provided during this visit. Patient/Guardian expressed understanding and agreed to proceed.   Weber Cooks, LCSW 04/16/2023

## 2023-04-17 ENCOUNTER — Ambulatory Visit: Admitting: Occupational Therapy

## 2023-04-19 ENCOUNTER — Ambulatory Visit: Admitting: Occupational Therapy

## 2023-04-23 ENCOUNTER — Ambulatory Visit: Attending: Orthopedic Surgery | Admitting: Occupational Therapy

## 2023-04-23 ENCOUNTER — Telehealth: Payer: Self-pay | Admitting: Occupational Therapy

## 2023-04-23 DIAGNOSIS — M79641 Pain in right hand: Secondary | ICD-10-CM | POA: Insufficient documentation

## 2023-04-23 DIAGNOSIS — M25531 Pain in right wrist: Secondary | ICD-10-CM | POA: Insufficient documentation

## 2023-04-23 DIAGNOSIS — R208 Other disturbances of skin sensation: Secondary | ICD-10-CM | POA: Insufficient documentation

## 2023-04-23 DIAGNOSIS — M6281 Muscle weakness (generalized): Secondary | ICD-10-CM | POA: Insufficient documentation

## 2023-04-23 DIAGNOSIS — R29898 Other symptoms and signs involving the musculoskeletal system: Secondary | ICD-10-CM | POA: Insufficient documentation

## 2023-04-23 NOTE — Telephone Encounter (Signed)
 Pt no-showed for OT appointment today, which is 3rd no-show/cancel within 24 hours. Therefore, OT called pt's listed phone number [phone]). Pt reported that she thought OT appointment was scheduled for tomorrow. Pt reported not feeling like she needed to continue with therapy at this point d/t feeling much better, completing exercises, and no longer wearing splint because pt felt splint "was holding myself back." OT provided option for one additional f/u OT visit or OT D/C today. Pt stated preference to come for one additional OT visit. OT provided date/time of upcoming OT appointment. Pt confirmed understanding. OT canceled all except 1 OT appointment.

## 2023-04-27 ENCOUNTER — Ambulatory Visit: Admitting: Occupational Therapy

## 2023-04-27 DIAGNOSIS — M25531 Pain in right wrist: Secondary | ICD-10-CM

## 2023-04-27 DIAGNOSIS — R29898 Other symptoms and signs involving the musculoskeletal system: Secondary | ICD-10-CM | POA: Diagnosis present

## 2023-04-27 DIAGNOSIS — M6281 Muscle weakness (generalized): Secondary | ICD-10-CM | POA: Diagnosis present

## 2023-04-27 DIAGNOSIS — R208 Other disturbances of skin sensation: Secondary | ICD-10-CM

## 2023-04-27 DIAGNOSIS — M79641 Pain in right hand: Secondary | ICD-10-CM

## 2023-04-27 NOTE — Therapy (Signed)
 OUTPATIENT OCCUPATIONAL THERAPY ORTHO TREATMENT / DISCHARGE  Patient Name: Shirley Greene MRN: [id number] DOB:[birth year], 28 y.o., female Today's Date: 04/27/2023  PCP: No PCP per pt report REFERRING PROVIDER: Samuella Cota, MD   OCCUPATIONAL THERAPY DISCHARGE SUMMARY  Visits from Start of Care: 3  Current functional level related to goals / functional outcomes: Pt has met 100% of STG and LTG to satisfactory levels and is pleased with outcomes.   Remaining deficits: Pt continuing to gradually return to functional tasks as tolerated.  Education / Equipment: Pt has all needed materials and education. Pt understands how to continue on with self-management. See tx notes for more details.    Patient goals were met. Patient is being discharged due to meeting the stated rehab goals. and pt is pleased with current functional level.      END OF SESSION:  OT End of Session - 04/27/23 1213     Visit Number 3    Number of Visits 13   including eval   Date for OT Re-Evaluation 06/01/23    Authorization Type [employer], Auth Required, VL: 27    OT Start Time 0848    OT Stop Time [phone]    OT Time Calculation (min) 40 min    Activity Tolerance Patient tolerated treatment well    Behavior During Therapy [facility] for tasks assessed/performed              Past Medical History:  Diagnosis Date   Alpha thalassemia silent carrier 05/27/2018   Anemia    Cervical dysplasia 02/15/2017   02/2018: pap neg   Chlamydia    GDM (gestational diabetes mellitus), class A1 07/11/2021   Infection    UTI   Postpartum depression    Sciatica 07/16/2018   Past Surgical History:  Procedure Laterality Date   CESAREAN SECTION N/A 04/05/2012   Procedure: CESAREAN SECTION;  Surgeon: Tilda Burrow, MD;  Location: [facility];  Service: Obstetrics;  Laterality: N/A;   CESAREAN SECTION WITH BILATERAL TUBAL LIGATION N/A 11/28/2018   Procedure: cesarean section;  Surgeon: Hermina Staggers, MD;  Location:  [facility];  Service: Obstetrics;  Laterality: N/A;   COLPOSCOPY  11/28/2022   DILATION AND EVACUATION N/A 11/19/2020   Procedure: DILATATION AND EVACUATION;  Surgeon: Myna Hidalgo, DO;  Location: [facility] OR;  Service: Gynecology;  Laterality: N/A;   MINOR RELEASE DORSAL COMPARTMENT (DEQUERVAINS) Right 03/23/2023   Procedure: RIGHT WRIST FIRST EXTENSOR COMPARTMENT RELEASE  (DEQUERVAINS);  Surgeon: Samuella Cota, MD;  Location: [facility];  Service: Orthopedics;  Laterality: Right;  RNFA   OPERATIVE ULTRASOUND N/A 11/19/2020   Procedure: OPERATIVE ULTRASOUND;  Surgeon: Myna Hidalgo, DO;  Location: [facility] OR;  Service: Gynecology;  Laterality: N/A;   Patient Active Problem List   Diagnosis Date Noted   Tommi Rumps Quervain's disease (radial styloid tenosynovitis) 03/23/2023   Postpartum depression    Current moderate episode of major depressive disorder without prior episode (HCC) 12/19/2022   History of hysterectomy leaving cervix intact 01/04/2022   Anemia affecting pregnancy, antepartum 11/14/2021   ASCUS with positive high risk HPV cervical on 04/05/2021 04/10/2021   Alpha thalassemia silent carrier 05/27/2018    ONSET DATE: 03/27/2023 (referral date), S/p De'Quervains release on 03/23/23, OT 2 week visit for ROM  REFERRING DIAG: M25.531 (ICD-10-CM) - Pain in right wrist   THERAPY DIAG:  Pain in right wrist  Pain in right hand  Other disturbances of skin sensation  Other symptoms and signs involving the musculoskeletal system  Muscle weakness (generalized)  Rationale for Evaluation and Treatment: Rehabilitation  SUBJECTIVE:   SUBJECTIVE STATEMENT: Pt reported tenderness at scar site. Pt reported improved feeling to thumb "not so much numbness." Pt reported some numbness and pointed out distal end of scar. Pt reported completing scar massage 2x per day. Pt reported doing most "normal" activities with hand though avoiding heavy lifting (e.g. lifting kids). Pt no longer wearing  brace.   Pt accompanied by: self   PERTINENT HISTORY: anemia, sciatica, chlamydia, Suzette Battiest s/p Berline Lopes release 03/23/23, current episode of major depressive disorder without prior episode 12/24, postpartum depression  PRECAUTIONS: Other:   Gentle ROM per protocol at 2-week interval, progress per [facility] Protocol 5th Edition  Per Dr. Fara Boros (03/29/23) : Pt demo'd some tendon instability intra op., fabricate forearm-based thumb spica splint then convert to hand-based at 4 weeks, recommended to remove splint for nerve and tendon glides to target hypersensitivity  RED FLAGS: Per Dr. Fara Boros (03/29/23):  Hypersensitivity concerns, tendon instability intra op    WEIGHT BEARING RESTRICTIONS: Yes, per [place] 5th Edition Protocol  PAIN:  Are you having pain? No  FALLS: Has patient fallen in last 6 months? No  LIVING ENVIRONMENT: Lives with: lives with their family Lives in: House/apartment  PLOF: ind  PATIENT GOALS: "to get back to normal"  NEXT MD VISIT: Dr. Fara Boros: 04/09/23  OBJECTIVE:  Note: Objective measures were completed at Evaluation unless otherwise noted.  HAND DOMINANCE: Right  ADLs: Eating: sore R hand, compensating with L hand PRN Grooming: sore R hand, compensating with L hand PRN Upper body dressing: sore R hand, compensating with L hand  PRN Lower body dressing: sore R hand, compensating with L hand  PRN Toileting: sore R hand, compensating with L hand PRN Bathing: sore R hand, compensating with L hand PRN  FUNCTIONAL OUTCOME MEASURES: QuickDash: 61.4% deficit     UPPER EXTREMITY ROM:     AROM - to be further assessed. AROM limited secondary to current precautions.   Active ROM Right eval Left eval  Shoulder flexion    Shoulder abduction    Shoulder adduction    Shoulder extension    Shoulder internal rotation    Shoulder external rotation    Elbow flexion    Elbow extension    Wrist flexion    Wrist extension    Wrist ulnar deviation     Wrist radial deviation    Wrist pronation    Wrist supination    (Blank rows = not tested)  Active ROM Right eval Left eval  Thumb MCP (0-60)    Thumb IP (0-80)    Thumb Radial abd/add (0-55)     Thumb Palmar abd/add (0-45)     Thumb Opposition to Small Finger     Index MCP (0-90)     Index PIP (0-100)     Index DIP (0-70)      Long MCP (0-90)      Long PIP (0-100)      Long DIP (0-70)      Ring MCP (0-90)      Ring PIP (0-100)      Ring DIP (0-70)      Little MCP (0-90)      Little PIP (0-100)      Little DIP (0-70)      (Blank rows = not tested)  Digits 2-5 AROM - WFL  HAND FUNCTION: Grip strength: Not tested per precautions  COORDINATION: 9 Hole Peg test: Not tested per  precautions  SENSATION: Pt reported tingling of R thumb.  EDEMA: mild R wrist/hand  COGNITION: Overall cognitive status: Within functional limits for tasks assessed  OBSERVATIONS: Pt ambulated ind without A/E. Pt was pleasant. Pt expressed concerns about long healing process expected after surgery d/t need to care for children and other daily activities. Pt accompanied by young son in stroller today. Pt presented to OT session today with bulky compressive covering at R wrist and hand and Xeroform over surgical site. OT removed Xeroform and noted surgical site appeared to be healing well with no apparent signs of infection. Pt demo'd some difficulty attending to precautions secondary to need to complete childcare tasks during evaluation today though pt attended to precautions to best of ability following education.    TREATMENT DATE:                                                                                                                        Self-Care OT educated pt on increasing frequency of scar massage and desensitization. Pt verbalized understanding. OT educated pt on scar remodeling process, UE anatomy, and scar tissue. Pt verbalized understanding. OT reviewed scar management  techniques, e.g. scar massage and option to use vitamin E oil. Pt returned demo of scar massage.   OT educated pt on precautions, pain threshold, joint protection. Pt acknowledged understanding.  OT educated pt on joint protection. Handout provided, see pt instructions. Pt acknowledged understanding.   Pt asked about Compression gloves - OT educated pt on compression gloves purpose and options, such as wearing compression gloves during repetitive work tasks for joint protection. OT showed pt examples in clinic and online. OT advised pt to ensure proper fit, monitor skin integrity. Pt acknowledged understanding.   TherAct OT assessed pt's progress towards goals, see below for updates.   OT educated pt on good progress towards goals and option to d/c today. Pt agreeable to d/c.   OT reviewed pt's HEP to improve carryover. Pt returned demo ind.   PATIENT EDUCATION: Education details: see today's tx above Person educated: Patient Education method: Explanation Education comprehension: verbalized understanding  HOME EXERCISE PROGRAM: 04/06/23 - tendon glides, splint wear/care 04/12/23: HEP for wrist/thumb, desensitization and scar massage  GOALS: Goals reviewed with patient? Yes  SHORT TERM GOALS: Target date: 05/04/23  Pt will be ind with initial HEP using visual handouts.  Baseline: new to outpt OT, tendon glides issued 04/27/23 - Pt returned demo of HEP. Goal status: MET  2.  Pt will demo understanding of splint wear/care schedule and ind don/doff splint. Baseline: splint fabricated at eval Goal status: MET  3. Pt will return demo of scar management strategies.  Baseline: new to outpt OT 04/27/23 - Pt returned demo of scar management.  Goal Status: MET  4. Pt will verbalize understanding of edema management and home modality to decrease pain, decrease edema, and decrease stiffness of affected UE.  Baseline: new ot outpt OT 04/27/23 - Pt ind recalled compression, rest, take a  break, medication management e.g. Advil, exercises.  Goal status: MET  5.  Pt will demo understanding of desensitization strategies of affected UE. Baseline: hypersensitivity of R hand Goal status: MET   6.  Pt will demo understanding of adaptive strategies and A/E options PRN to improve ind for functional ADL/IADL tasks, including but not limited to childcare tasks.  Baseline: limited d/t RUE soreness, compensating with LUE 04/27/23 - Pt reported return to normal activity, no concerns. Continues to be cautious with heavy lifting. Goal status: MET  LONG TERM GOALS: Target date: 06/01/23  Patient will demonstrate at least RUE lbs 30 lbs grip strength as needed to open jars and other containers.  Baseline: Strength not tested d/t current precautions. 04/27/23 - RUE - 30.6 lbs LUE - 35.2 lbs. Pt denied pain. Goal status: MET  2. Patient will demo improved FM coordination as evidenced by completing nine-hole peg with use of RUE in 25 seconds or less.  Baseline: FM coordination not tested d/t current precautions. 04/27/23 - RUE - 17 seconds. LUE - 25 seconds. Goal status: MET  3.  Pt will demo functional AROM to complete tip pinch, 3-point pinch, and lateral pinch as needed for ADL/IADL tasks using RUE.  Baseline: Limited AROM secondary to recent surgery and current precautions 04/27/23 - Pt demo'd good ability to complete tip pinch, 3-point pinch, and lateral pinch. Pt reported completing all motions during functional tasks without difficulty.  Goal status: MET  4.  Pt will demo [facility] wrist AROM as needed to complete ADL/IADL tasks using RUE. Baseline:  04/27/22 - WFL RUE AROM wrist Goal status: MET  ASSESSMENT:  CLINICAL IMPRESSION: Pt demo'd good understanding of education today. Pt demo'd good carryover of previous OT session education. Pt has met 100% of STG and LTG to satisfactory levels and is pleased with outcomes. Pt continuing to gradually return to functional tasks as tolerated. Pt  has all needed materials and education. Pt understands how to continue on with self-management. See tx notes for more details. Patient goals were met. Patient is being discharged due to meeting the stated rehab goals and pt is pleased with current functional level.  PERFORMANCE DEFICITS: in functional skills including ADLs, IADLs, coordination, dexterity, proprioception, sensation, edema, ROM, strength, pain, flexibility, Fine motor control, Gross motor control, body mechanics, endurance, wound, skin integrity, and UE functional use, cognitive skills including energy/drive, and psychosocial skills including environmental adaptation.   IMPAIRMENTS: are limiting patient from ADLs, IADLs, rest and sleep, work, play, leisure, and social participation.   COMORBIDITIES: may have co-morbidities  that affects occupational performance. Patient will benefit from skilled OT to address above impairments and improve overall function.  MODIFICATION OR ASSISTANCE TO COMPLETE EVALUATION: Min-Moderate modification of tasks or assist with assess necessary to complete an evaluation.  OT OCCUPATIONAL PROFILE AND HISTORY: Detailed assessment: Review of records and additional review of physical, cognitive, psychosocial history related to current functional performance.  CLINICAL DECISION MAKING: Moderate - several treatment options, min-mod task modification necessary  REHAB POTENTIAL: Good  EVALUATION COMPLEXITY: Moderate      PLAN:  OT FREQUENCY: 2x/week  OT DURATION: 6 weeks (dates extended to allow for scheduling)  PLANNED INTERVENTIONS: 97168 OT Re-evaluation, 97535 self care/ADL training, 57846 therapeutic exercise, 97530 therapeutic activity, 97112 neuromuscular re-education, 97140 manual therapy, 97035 ultrasound, 97018 paraffin, 96295 fluidotherapy, 97032 electrical stimulation (manual), 97014 electrical stimulation unattended, 97760 Orthotics management and training, 28413 Splinting (initial  encounter), [id number] Subsequent splinting/medication, scar mobilization, passive range of motion, functional mobility training,  compression bandaging, visual/perceptual remediation/compensation, energy conservation, patient/family education, and DME and/or AE instructions  RECOMMENDED OTHER SERVICES: n/a  CONSULTED AND AGREED WITH PLAN OF CARE: Patient  PLAN FOR NEXT SESSION:  N/A - OT D/C today  For all possible CPT codes, reference the Planned Interventions line above.     Check all conditions that are expected to impact treatment: {Conditions expected to impact treatment:Musculoskeletal disorders   If treatment provided at initial evaluation, no treatment charged due to lack of authorization.        Wynetta Emery, OT 04/27/2023, 12:19 PM

## 2023-05-01 ENCOUNTER — Ambulatory Visit (HOSPITAL_COMMUNITY): Admitting: Licensed Clinical Social Worker

## 2023-05-01 ENCOUNTER — Encounter: Admitting: Occupational Therapy

## 2023-05-03 ENCOUNTER — Encounter: Admitting: Occupational Therapy

## 2023-05-08 ENCOUNTER — Encounter: Admitting: Occupational Therapy

## 2023-05-10 ENCOUNTER — Ambulatory Visit (INDEPENDENT_AMBULATORY_CARE_PROVIDER_SITE_OTHER): Admitting: Orthopedic Surgery

## 2023-05-10 ENCOUNTER — Ambulatory Visit (HOSPITAL_COMMUNITY): Admitting: Licensed Clinical Social Worker

## 2023-05-10 ENCOUNTER — Encounter (HOSPITAL_COMMUNITY): Payer: Self-pay

## 2023-05-10 ENCOUNTER — Encounter: Admitting: Occupational Therapy

## 2023-05-10 DIAGNOSIS — M654 Radial styloid tenosynovitis [de Quervain]: Secondary | ICD-10-CM

## 2023-05-10 NOTE — Progress Notes (Signed)
   Shirley Greene - 28 y.o. female MRN [id number]  Date of birth: Apr 07, 1995  Office Visit Note: Visit Date: 05/10/2023 PCP: Patient, No Pcp Per Referred by: No ref. provider found  Subjective:  HPI: Shirley Greene is a 28 y.o. female who presents today for follow up 7 weeks status post right wrist first extensor compartment release.  She is doing well overall, is returning to activities as tolerated.  Utilizing wrist brace occasionally.  Pertinent ROS were reviewed with the patient and found to be negative unless otherwise specified above in HPI.   Assessment & Plan: Visit Diagnoses: No diagnosis found.  Plan: At this juncture, she can progress with activities as tolerated.  She can continue to wean from the wrist brace at this time.  I will plan on seeing her back in approxi-6 weeks to track her progress.  Follow-up: No follow-ups on file.   Meds & Orders: No orders of the defined types were placed in this encounter.  No orders of the defined types were placed in this encounter.    Procedures: No procedures performed       Objective:   Vital Signs: LMP  (LMP Unknown)   Ortho Exam Right wrist with well-old dorsal radial incision wrist circumduction and thumb circumduction without significant pain, no evidence of ongoing instability at the first extensor compartment tendons   Wrist flexion/extension 55/45, composite fist without restriction, sensation intact M/R/U, AIN/PIN/interosseous intact motor, hand remains warm well-perfused  Imaging: No results found.   Kilo Eshelman Alvia Jointer, M.D. [facility], Hand Surgery

## 2023-05-11 ENCOUNTER — Ambulatory Visit (INDEPENDENT_AMBULATORY_CARE_PROVIDER_SITE_OTHER): Admitting: Family

## 2023-05-11 VITALS — BP 118/79 | HR 97 | Temp 98.5°F | Resp 16 | Ht 61.0 in | Wt 193.2 lb

## 2023-05-11 DIAGNOSIS — Z1329 Encounter for screening for other suspected endocrine disorder: Secondary | ICD-10-CM

## 2023-05-11 DIAGNOSIS — Z13228 Encounter for screening for other metabolic disorders: Secondary | ICD-10-CM | POA: Diagnosis not present

## 2023-05-11 DIAGNOSIS — Z130 Encounter for screening for diseases of the blood and blood-forming organs and certain disorders involving the immune mechanism: Secondary | ICD-10-CM | POA: Diagnosis not present

## 2023-05-11 DIAGNOSIS — Z841 Family history of disorders of kidney and ureter: Secondary | ICD-10-CM

## 2023-05-11 DIAGNOSIS — Z0000 Encounter for general adult medical examination without abnormal findings: Secondary | ICD-10-CM

## 2023-05-11 DIAGNOSIS — Z7689 Persons encountering health services in other specified circumstances: Secondary | ICD-10-CM

## 2023-05-11 DIAGNOSIS — Z131 Encounter for screening for diabetes mellitus: Secondary | ICD-10-CM | POA: Diagnosis not present

## 2023-05-11 DIAGNOSIS — Z13220 Encounter for screening for lipoid disorders: Secondary | ICD-10-CM | POA: Diagnosis not present

## 2023-05-11 DIAGNOSIS — E1165 Type 2 diabetes mellitus with hyperglycemia: Secondary | ICD-10-CM | POA: Diagnosis not present

## 2023-05-11 DIAGNOSIS — D649 Anemia, unspecified: Secondary | ICD-10-CM | POA: Diagnosis not present

## 2023-05-11 NOTE — Progress Notes (Signed)
 Subjective:    Shirley Greene - 28 y.o. female MRN [id number]  Date of birth: 12-31-1995  HPI  Shirley Greene is to establish care and annual exam.   Current issues and/or concerns: - States family history of kidney disease and was told hereditary.  - Established with Gynecology for women's health maintenance and screenings.  - Weight gain. She watches what she eats. She exercises. She declines pharmacological therapy and referral to weight specialist.  - No further issues/concerns for discussion today.  ROS per HPI    Health Maintenance:  Health Maintenance Due  Topic Date Due   COVID-19 Vaccine (1) Never done   HPV VACCINES (2 - 3-dose series) 12/27/2015     Past Medical History: Patient Active Problem List   Diagnosis Date Noted   Shirley Greene Quervain's disease (radial styloid tenosynovitis) 03/23/2023   Postpartum depression    Current moderate episode of major depressive disorder without prior episode (HCC) 12/19/2022   History of hysterectomy leaving cervix intact 01/04/2022   Anemia affecting pregnancy, antepartum 11/14/2021   ASCUS with positive high risk HPV cervical on 04/05/2021 04/10/2021   Alpha thalassemia silent carrier 05/27/2018      Social History   reports that she has never smoked. She has never used smokeless tobacco. She reports current alcohol use. She reports that she does not use drugs.   Family History  family history includes Cancer in her maternal grandfather; Hypertension in her father and mother.   Medications: reviewed and updated   Objective:   Physical Exam BP 118/79   Pulse 97   Temp 98.5 F (36.9 C) (Oral)   Resp 16   Ht 5\' 1"  (1.549 m)   Wt 193 lb 3.2 oz (87.6 kg)   LMP  (LMP Unknown)   SpO2 95%   BMI 36.50 kg/m   Physical Exam HENT:     Head: Normocephalic and atraumatic.     Right Ear: Tympanic membrane, ear canal and external ear normal.     Left Ear: Tympanic membrane, ear canal and external ear normal.     Nose:  Nose normal.     Mouth/Throat:     Mouth: Mucous membranes are moist.     Pharynx: Oropharynx is clear.  Eyes:     Extraocular Movements: Extraocular movements intact.     Conjunctiva/sclera: Conjunctivae normal.     Pupils: Pupils are equal, round, and reactive to light.  Neck:     Thyroid : No thyroid  mass, thyromegaly or thyroid  tenderness.  Cardiovascular:     Rate and Rhythm: Normal rate and regular rhythm.     Pulses: Normal pulses.     Heart sounds: Normal heart sounds.  Pulmonary:     Effort: Pulmonary effort is normal.     Breath sounds: Normal breath sounds.  Chest:     Comments: Patient declined. Abdominal:     General: Bowel sounds are normal.     Palpations: Abdomen is soft.  Genitourinary:    Comments: Patient declined. Musculoskeletal:        General: Normal range of motion.     Right shoulder: Normal.     Left shoulder: Normal.     Right upper arm: Normal.     Left upper arm: Normal.     Right elbow: Normal.     Left elbow: Normal.     Right forearm: Normal.     Left forearm: Normal.     Right wrist: Normal.     Left wrist: Normal.  Right hand: Normal.     Left hand: Normal.     Cervical back: Normal, normal range of motion and neck supple.     Thoracic back: Normal.     Lumbar back: Normal.     Right hip: Normal.     Left hip: Normal.     Right upper leg: Normal.     Left upper leg: Normal.     Right knee: Normal.     Left knee: Normal.     Right lower leg: Normal.     Left lower leg: Normal.     Right ankle: Normal.     Left ankle: Normal.     Right foot: Normal.     Left foot: Normal.  Skin:    General: Skin is warm and dry.     Capillary Refill: Capillary refill takes less than 2 seconds.  Neurological:     General: No focal deficit present.     Mental Status: She is alert and oriented to person, place, and time.  Psychiatric:        Mood and Affect: Mood normal.        Behavior: Behavior normal.        Assessment & Plan:  1.  Encounter to establish care (Primary) 2. Annual physical exam - Counseled on 150 minutes of exercise per week as tolerated, healthy eating (including decreased daily intake of saturated fats, cholesterol, added sugars, sodium), STI prevention, and routine healthcare maintenance.  3. Screening for metabolic disorder - Routine screening.  - CMP14+EGFR  4. Screening for deficiency anemia - Routine screening.  - CBC  5. Diabetes mellitus screening - Routine screening.  - Hemoglobin A1c  6. Screening cholesterol level - Routine screening.  - Lipid panel  7. Thyroid  disorder screen - Routine screening.  - TSH  8. Family history of kidney disease - Referral to Nephrology for evaluation/management. - Ambulatory referral to Nephrology    Patient was given clear instructions to go to Emergency Department or return to medical center if symptoms don't improve, worsen, or new problems develop.The patient verbalized understanding.  I discussed the assessment and treatment plan with the patient. The patient was provided an opportunity to ask questions and all were answered. The patient agreed with the plan and demonstrated an understanding of the instructions.   The patient was advised to call back or seek an in-person evaluation if the symptoms worsen or if the condition fails to improve as anticipated.    Lavona Pounds, NP 05/11/2023, 10:10 AM Primary Care at [facility]

## 2023-05-11 NOTE — Progress Notes (Signed)
 Patient concern about overall health, diabetes, family history of kidney disease

## 2023-05-12 LAB — TSH: TSH: 1.6 u[IU]/mL (ref 0.450–4.500)

## 2023-05-12 LAB — CBC
Hematocrit: 42.3 % (ref 34.0–46.6)
Hemoglobin: 13.2 g/dL (ref 11.1–15.9)
MCH: 24.6 pg — ABNORMAL LOW (ref 26.6–33.0)
MCHC: 31.2 g/dL — ABNORMAL LOW (ref 31.5–35.7)
MCV: 79 fL (ref 79–97)
Platelets: 349 10*3/uL (ref 150–450)
RBC: 5.37 x10E6/uL — ABNORMAL HIGH (ref 3.77–5.28)
RDW: 15.1 % (ref 11.7–15.4)
WBC: 6.9 10*3/uL (ref 3.4–10.8)

## 2023-05-12 LAB — CMP14+EGFR
ALT: 12 IU/L (ref 0–32)
AST: 16 IU/L (ref 0–40)
Albumin: 4.1 g/dL (ref 4.0–5.0)
Alkaline Phosphatase: 71 IU/L (ref 44–121)
BUN/Creatinine Ratio: 12 (ref 9–23)
BUN: 10 mg/dL (ref 6–20)
Bilirubin Total: 0.3 mg/dL (ref 0.0–1.2)
CO2: 22 mmol/L (ref 20–29)
Calcium: 9.4 mg/dL (ref 8.7–10.2)
Chloride: 102 mmol/L (ref 96–106)
Creatinine, Ser: 0.84 mg/dL (ref 0.57–1.00)
Globulin, Total: 2.5 g/dL (ref 1.5–4.5)
Glucose: 148 mg/dL — ABNORMAL HIGH (ref 70–99)
Potassium: 4.2 mmol/L (ref 3.5–5.2)
Sodium: 138 mmol/L (ref 134–144)
Total Protein: 6.6 g/dL (ref 6.0–8.5)
eGFR: 96 mL/min/{1.73_m2} (ref >59)

## 2023-05-12 LAB — LIPID PANEL
Chol/HDL Ratio: 3.1 ratio (ref 0.0–4.4)
Cholesterol, Total: 137 mg/dL (ref 100–199)
HDL: 44 mg/dL (ref >39)
LDL Chol Calc (NIH): 75 mg/dL (ref 0–99)
Triglycerides: 98 mg/dL (ref 0–149)
VLDL Cholesterol Cal: 18 mg/dL (ref 5–40)

## 2023-05-12 LAB — HEMOGLOBIN A1C
Est. average glucose Bld gHb Est-mCnc: 148 mg/dL
Hgb A1c MFr Bld: 6.8 % — ABNORMAL HIGH (ref 4.8–5.6)

## 2023-05-14 ENCOUNTER — Encounter: Payer: Self-pay | Admitting: Family

## 2023-05-14 ENCOUNTER — Other Ambulatory Visit: Payer: Self-pay | Admitting: Family

## 2023-05-14 ENCOUNTER — Ambulatory Visit (HOSPITAL_COMMUNITY): Admitting: Licensed Clinical Social Worker

## 2023-05-14 DIAGNOSIS — F53 Postpartum depression: Secondary | ICD-10-CM

## 2023-05-14 DIAGNOSIS — F321 Major depressive disorder, single episode, moderate: Secondary | ICD-10-CM

## 2023-05-14 DIAGNOSIS — Z130 Encounter for screening for diseases of the blood and blood-forming organs and certain disorders involving the immune mechanism: Secondary | ICD-10-CM

## 2023-05-14 DIAGNOSIS — E1165 Type 2 diabetes mellitus with hyperglycemia: Secondary | ICD-10-CM

## 2023-05-14 MED ORDER — METFORMIN HCL ER 500 MG PO TB24
500.0000 mg | ORAL_TABLET | Freq: Every day | ORAL | 0 refills | Status: DC
Start: 2023-05-14 — End: 2023-08-23
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
## 2023-05-15 ENCOUNTER — Encounter: Admitting: Occupational Therapy

## 2023-05-18 ENCOUNTER — Encounter: Admitting: Occupational Therapy

## 2023-05-29 ENCOUNTER — Ambulatory Visit: Payer: Medicaid Other | Admitting: Family

## 2023-05-31 ENCOUNTER — Ambulatory Visit (HOSPITAL_COMMUNITY): Admitting: Licensed Clinical Social Worker

## 2023-05-31 NOTE — Progress Notes (Unsigned)
 [facility] MD Outpatient Progress Note  06/01/2023 1:31 PM Shirley Greene  MRN:  [id number]  Assessment:  Shirley Greene presents for follow-up evaluation. Today, 06/01/23, patient reports persistence of low mood and energy/motivation as well as frequent overwhelm despite engagement in therapy and attempts to enact behavioral changes. While she feels that therapy, exercise, and returning to work have been helpful supports she feels that she has to put forth more physical and mental effort to conduct these tasks. She is amenable to starting standing psychotropic at this time and opted to pursue trial with Prozac. Patient will also be starting iron  tablets for anemia managed by PCP and plans to obtain Vitamin D  level at next PCP appointment.  RTC in approx. 2 months by video. Patient encouraged to reach out to clinic if any concerns or side effects to medication in the interim.  Identifying Information: Shirley Greene is a 28 y.o. [phone] female with a history of postpartum depression, iron  deficiency anemia, and T2DM who is an established patient with [facility]. On initial evaluation, patient denied prior psychiatric history until 2 months postpartum (s/p delivery Nov 2023) following distressing hospital course requiring emergent C section at 33 weeks, NICU stay, and hysterectomy. She reports that being away from 2 older children elicited significant anxiety and since has felt heightened sense of anxiety when separated from children. Fortunately, she reports that she and baby boy are doing well physically. In the months subsequent to these events, she endorses onset of low mood, decreased energy, feelings of guilt, easy overwhelm, and disruption to sleep and appetite. Denies SI/HI and no acute safety concerns. She states these symptoms have been gradually improving over time especially as middle child has started preschool. Her main complaint since that time is loss of sense of self,  feeling that majority of her time and energy is invested in others with little time for self care. She has opted to focus on psychotherapy and behavioral changes for time being and have deferred initiation of standing psychotropic.  Plan:  # Postpartum depression, currently mild # GAD Past medication trials: none Status of problem: recent exacerbation Interventions: -- START Prozac 20 mg daily -- Risks, benefits, and side effects including but not limited to HA, GI upset, sleep disturbance, sexual side effects, overactivation were reviewed with informed consent provided -- Continue Atarax  10 mg BID PRN anxiety/sleep -- R/o medical contributors: CBC revealing for mild anemia and started on iron  tablets by PCP, TSH 05/11/23 wnl, plans to obtain Vitamin D  through PCP -- Continue individual psychotherapy with Buell Carmin LCSW -- Patient is no longer breastfeeding  Patient was given contact information for behavioral health clinic and was instructed to call 911 for emergencies.   Subjective:  Chief Complaint:  Chief Complaint  Patient presents with   Medication Management   Interval History:   Patient reports she has been "okay" although continues to deal with anxiety. Continuing to recover from hand surgery although functionality is gradually improving. 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 Identifies desire to work more but trying to balance work with childcare. Has found therapy helpful and recently discussed healthy and unhealthy ways of communicating.  Mood has still been a bit low with low energy and motivation. Reports feeling easily overwhelmed in response to small stressors. Has tried Atarax  - has found this helpful for sleep and anxiety at night. Shares that when she exercises, sleep is better and positively impacts mood. Shares plans to partner with  fiance and exercise at [employer]. However, feels that despite desire to pursue behavioral interventions to  improve mood and anxiety, she has been difficulty attaining the energy and follow through to do this. Identifies feeling that action items are taking more physical and mental effort than she would expect.  Is now amenable to initiation of standing psychotropic with decision to start Prozac at this time.   Reviewed recent labs. Has not started iron  tablets. Will plan to get Vitamin D  lab at next PCP appt.    Visit Diagnosis:    ICD-10-CM   1. Postpartum depression  F53.0     2. GAD (generalized anxiety disorder)  F41.1      Past Psychiatric History:  Diagnoses: recent diagnosis of postpartum depression; GAD Medication trials: denies Previous psychiatrist/therapist: denies Hospitalizations: denies Suicide attempts: denies SIB: denies Hx of violence towards others: denies Current access to guns: denies Hx of trauma/abuse: denies Substance use:              -- Etoh: every few months; approx. 1-2 drinks in a sitting             -- Tobacco: denies             -- Cannabis/CBD/THC: denies             -- Illicit drugs: denies  Past Medical History:  Past Medical History:  Diagnosis Date   Alpha thalassemia silent carrier 05/27/2018   Anemia    Cervical dysplasia 02/15/2017   02/2018: pap neg   Chlamydia    GDM (gestational diabetes mellitus), class A1 07/11/2021   Infection    UTI   Postpartum depression    Sciatica 07/16/2018    Past Surgical History:  Procedure Laterality Date   CESAREAN SECTION N/A 04/05/2012   Procedure: CESAREAN SECTION;  Surgeon: Albino Hum, MD;  Location: [facility];  Service: Obstetrics;  Laterality: N/A;   CESAREAN SECTION WITH BILATERAL TUBAL LIGATION N/A 11/28/2018   Procedure: cesarean section;  Surgeon: Othelia Blinks, MD;  Location: [facility];  Service: Obstetrics;  Laterality: N/A;   COLPOSCOPY  11/28/2022   DILATION AND EVACUATION N/A 11/19/2020   Procedure: DILATATION AND EVACUATION;  Surgeon: Ozan, Jennifer, DO;  Location: [facility] OR;  Service:  Gynecology;  Laterality: N/A;   MINOR RELEASE DORSAL COMPARTMENT (DEQUERVAINS) Right 03/23/2023   Procedure: RIGHT WRIST FIRST EXTENSOR COMPARTMENT RELEASE  (DEQUERVAINS);  Surgeon: Merrill Abide, MD;  Location: [facility];  Service: Orthopedics;  Laterality: Right;  RNFA   OPERATIVE ULTRASOUND N/A 11/19/2020   Procedure: OPERATIVE ULTRASOUND;  Surgeon: Keene Pastures, DO;  Location: [facility] OR;  Service: Gynecology;  Laterality: N/A;    Family Psychiatric History: denies  Family History:  Family History  Problem Relation Age of Onset   Hypertension Mother    Hypertension Father    Cancer Maternal Grandfather     Social History:  Academic/Vocational: completed high school and cosmetology school; self-employed as Associate Professor   Social History   Socioeconomic History   Marital status: Single    Spouse name: Not on file   Number of children: Not on file   Years of education: Not on file   Highest education level: Not on file  Occupational History   Not on file  Tobacco Use   Smoking status: Never   Smokeless tobacco: Never  Vaping Use   Vaping status: Never Used  Substance and Sexual Activity   Alcohol use: Yes    Comment:  every few months   Drug use: No   Sexual activity: Not Currently    Birth control/protection: None  Other Topics Concern   Not on file  Social History Narrative   Not on file   Social Drivers of Health   Financial Resource Strain: Low Risk  (12/19/2022)   Overall Financial Resource Strain (CARDIA)    Difficulty of Paying Living Expenses: Not hard at all  Food Insecurity: No Food Insecurity (12/19/2022)   Hunger Vital Sign    Worried About Running Out of Food in the Last Year: Never true    Ran Out of Food in the Last Year: Never true  Transportation Needs: No Transportation Needs (12/19/2022)   PRAPARE - Administrator, Civil Service (Medical): No    Lack of Transportation (Non-Medical): No  Physical Activity: Inactive  (12/19/2022)   Exercise Vital Sign    Days of Exercise per Week: 0 days    Minutes of Exercise per Session: 0 min  Stress: Stress Concern Present (12/19/2022)   [employer] of Occupational Health - Occupational Stress Questionnaire    Feeling of Stress : To some extent  Social Connections: Moderately Isolated (12/19/2022)   Social Connection and Isolation Panel [NHANES]    Frequency of Communication with Friends and Family: More than three times a week    Frequency of Social Gatherings with Friends and Family: Once a week    Attends Religious Services: Never    Database administrator or Organizations: No    Attends Engineer, structural: Never    Marital Status: Living with partner    Allergies: No Known Allergies  Current Medications: Current Outpatient Medications  Medication Sig Dispense Refill   FLUoxetine (PROZAC) 20 MG capsule Take 1 capsule (20 mg total) by mouth daily. 30 capsule 2   hydrOXYzine  (ATARAX ) 10 MG tablet Take 0.5-1 tablets (5-10 mg total) by mouth 2 (two) times daily as needed for anxiety (or sleep). 30 tablet 2   metFORMIN  (GLUCOPHAGE -XR) 500 MG 24 hr tablet Take 1 tablet (500 mg total) by mouth daily with breakfast. (Patient not taking: Reported on 06/01/2023) 90 tablet 0   ondansetron  (ZOFRAN -ODT) 4 MG disintegrating tablet Take 1 tablet (4 mg total) by mouth every 8 (eight) hours as needed for nausea or vomiting. 20 tablet 0   oxyCODONE  (ROXICODONE ) 5 MG immediate release tablet Take 1 tablet (5 mg total) by mouth every 6 (six) hours as needed for severe pain (pain score 7-10). (Patient not taking: Reported on 04/06/2023) 20 tablet 0   No current facility-administered medications for this visit.    ROS: Reports hand pain s/p hand surgery  Objective:  Psychiatric Specialty Exam: There were no vitals taken for this visit.There is no height or weight on file to calculate BMI.  General Appearance: Casual and Well Groomed  Eye Contact:  Good   Speech:  Clear and Coherent and Normal Rate  Volume:  Normal  Mood:  "low; still overwhelmed"  Affect:  Euthymic; calm; pleasant  Thought Content: Denies AVH; no delusional thought content on interview     Suicidal Thoughts:  No  Homicidal Thoughts:  No  Thought Process:  Goal Directed and Linear  Orientation:  Full (Time, Place, and Person)    Memory:  Grossly intact   Judgment:  Good  Insight:  Good  Concentration:  Concentration: Good  Recall:  not formally assessed   Fund of Knowledge: Good  Language: Good  Psychomotor Activity:  Normal  Akathisia:  NA  AIMS (if indicated): NA  Assets:  Communication Skills Desire for Improvement Housing Intimacy Leisure Time Physical Health Resilience Social Support Talents/Skills Transportation  ADL's:  Intact  Cognition: WNL  Sleep:  Fair   PE: General: sits comfortably in view of camera; no acute distress  Pulm: no increased work of breathing on room air  MSK: all extremity movements appear intact  Neuro: no focal neurological deficits observed  Gait & Station: unable to assess by video    Metabolic Disorder Labs: Lab Results  Component Value Date   HGBA1C 6.8 (H) 05/11/2023   No results found for: "PROLACTIN" Lab Results  Component Value Date   CHOL 137 05/11/2023   TRIG 98 05/11/2023   HDL 44 05/11/2023   CHOLHDL 3.1 05/11/2023   LDLCALC 75 05/11/2023   Lab Results  Component Value Date   TSH 1.600 05/11/2023   TSH CANCELED 03/12/2023    Therapeutic Level Labs: No results found for: "LITHIUM" No results found for: "VALPROATE" No results found for: "CBMZ"  Screenings:  GAD-7    Flowsheet Row Office Visit from 05/11/2023 in [facility] Counselor from 12/19/2022 in [facility]  Total GAD-7 Score 8 10      PHQ2-9    Flowsheet Row Office Visit from 05/11/2023 in [facility] Counselor from 12/19/2022 in [facility] Nutrition from 07/27/2021 in [facility]  - A Dept Of . [facility]  PHQ-2 Total Score 2 2 0  PHQ-9 Total Score 8 7 --      Flowsheet Row Admission (Discharged) from 03/23/2023 in [facility] from 03/12/2023 in [facility] from 12/19/2022 in [facility]  C-SSRS RISK CATEGORY No Risk No Risk No Risk       Collaboration of Care: Collaboration of Care: Medication Management AEB active medication management, Psychiatrist AEB established with this provider, and Referral or follow-up with counselor/therapist AEB established with individual psychotherapy  Patient/Guardian was advised Release of Information must be obtained prior to any record release in order to collaborate their care with an outside provider. Patient/Guardian was advised if they have not already done so to contact the registration department to sign all necessary forms in order for us  to release information regarding their care.   Consent: Patient/Guardian gives verbal consent for treatment and assignment of benefits for services provided during this visit. Patient/Guardian expressed understanding and agreed to proceed.   Televisit via video: I connected with patient on 06/01/23 at  9:00 AM EDT by a video enabled telemedicine application and verified that I am speaking with the correct person using two identifiers.  Location: Patient: home address in [place] Provider: remote office in [place]   I discussed the limitations of evaluation and management by telemedicine and the availability of in person appointments. The patient expressed understanding and agreed to proceed.  I discussed the assessment and treatment plan with the patient. The patient was provided an opportunity to ask questions and all were answered. The patient agreed with the plan and demonstrated an understanding of the instructions.   The  patient was advised to call back or seek an in-person evaluation if the symptoms worsen or if the condition fails to improve as anticipated.  I provided 30 minutes dedicated to the care of this patient via video on the date of this encounter to include  chart review, face-to-face time with the patient, medication management/counseling, documentation, medication management, brief therapeutic support.  Taijah Macrae A Sephira Zellman 06/01/2023, 1:31 PM

## 2023-06-01 ENCOUNTER — Encounter (HOSPITAL_COMMUNITY): Payer: Self-pay | Admitting: Psychiatry

## 2023-06-01 ENCOUNTER — Telehealth (HOSPITAL_COMMUNITY): Admitting: Psychiatry

## 2023-06-01 DIAGNOSIS — F411 Generalized anxiety disorder: Secondary | ICD-10-CM | POA: Diagnosis not present

## 2023-06-01 DIAGNOSIS — F53 Postpartum depression: Secondary | ICD-10-CM

## 2023-06-01 MED ORDER — HYDROXYZINE HCL 10 MG PO TABS
5.0000 mg | ORAL_TABLET | Freq: Two times a day (BID) | ORAL | 2 refills | Status: AC | PRN
Start: 2023-06-01 — End: 2023-08-30

## 2023-06-01 MED ORDER — FLUOXETINE HCL 20 MG PO CAPS: 20.0000 mg | ORAL_CAPSULE | Freq: Every day | ORAL | 2 refills | Status: DC

## 2023-06-01 NOTE — Patient Instructions (Signed)
 Thank you for attending your appointment today.  -- START Prozac 20 mg daily -- Continue other medications as prescribed.  Please do not make any changes to medications without first discussing with your provider. If you are experiencing a psychiatric emergency, please call 911 or present to your nearest emergency department. Additional crisis, medication management, and therapy resources are included below.  [facility]  [address], [place], [place] [address] [phone] WALK-IN URGENT CARE 24/7 FOR ANYONE [address], [place], [place]  [phone] Fax: [phone] [email or web address] *Interpreters available *Accepts all insurance and uninsured for Urgent Care needs *Accepts Medicaid and uninsured for outpatient treatment (below)      ONLY FOR [facility]  Below:    Outpatient New Patient Assessment/Therapy Walk-ins:        Monday, Wednesday, and Thursday 8am until slots are full (first come, first served)                   New Patient Psychiatry/Medication Management        Monday-Friday 8am-11am (first come, first served)               For all walk-ins we ask that you arrive by 7:15am, because patients will be seen in the order of arrival.

## 2023-06-05 ENCOUNTER — Ambulatory Visit (HOSPITAL_COMMUNITY): Admitting: Licensed Clinical Social Worker

## 2023-06-05 ENCOUNTER — Encounter (HOSPITAL_COMMUNITY): Payer: Self-pay

## 2023-06-20 ENCOUNTER — Encounter: Admitting: Orthopedic Surgery

## 2023-07-03 ENCOUNTER — Ambulatory Visit (INDEPENDENT_AMBULATORY_CARE_PROVIDER_SITE_OTHER): Admitting: Licensed Clinical Social Worker

## 2023-07-03 DIAGNOSIS — F411 Generalized anxiety disorder: Secondary | ICD-10-CM | POA: Insufficient documentation

## 2023-07-03 DIAGNOSIS — F53 Postpartum depression: Secondary | ICD-10-CM

## 2023-07-03 NOTE — Progress Notes (Signed)
 THERAPIST PROGRESS NOTE  Virtual Visit via Video Note  I connected with Shirley Greene on 07/03/23 at  2:00 PM EDT by a video enabled telemedicine application and verified that I am speaking with the correct person using two identifiers.  Location: Patient: [facility]  Provider: Providers Home    I discussed the limitations of evaluation and management by telemedicine and the availability of in person appointments. The patient expressed understanding and agreed to proceed.      I discussed the assessment and treatment plan with the patient. The patient was provided an opportunity to ask questions and all were answered. The patient agreed with the plan and demonstrated an understanding of the instructions.   The patient was advised to call back or seek an in-person evaluation if the symptoms worsen or if the condition fails to improve as anticipated.  I provided 55 minutes of non-face-to-face time during this encounter.   Maryagnes Small, LCSW   Participation Level: Active  Behavioral Response: CasualAlertAnxious and Depressed  Type of Therapy: Individual Therapy  Treatment Goals addressed:  Active     Anxiety     STG: Shirley Greene will complete at least 80% of assigned homework  (Progressing)     Start:  12/19/22    Expected End:  07/20/23         STG: Shirley Greene will practice problem solving skills 3 times per week for the next 4 weeks.  (Progressing)     Start:  12/19/22    Expected End:  07/20/23         Create 3 coping skills for anxiety (Progressing)     Start:  12/19/22    Expected End:  07/20/23         Notify 3 triggers for anxiety (Progressing)     Start:  12/19/22    Expected End:  07/20/23       Goal Note     Post op surgical syptoms  Financials due to not being able to work           Encourage Shirley Greene to take psychotropic medication(s) as prescribed     Start:  12/19/22       Intervention Note     Reviewed with patient during session.           Review results of GAD-7 with Shirley Greene to track progress     Start:  12/19/22       Intervention Note     Reviewed with patient during session.          Perform psychoeducation regarding anxiety disorders     Start:  12/19/22       Intervention Note     Reviewed with patient during session.          Work with Shirley Greene to track symptoms, triggers, and/or skill use through a mood chart, diary card, or journal (Completed)     Start:  12/19/22    End:  04/16/23    Intervention Note     Reviewed with patient during session.          Discuss risks and benefits of medication treatment options for this problem and prescribe as indicated (Completed)     Start:  12/19/22    End:  04/16/23    Intervention Note     Reviewed with patient during session.            OP Depression     LTG: Reduce frequency, intensity, and duration of depression  symptoms so that daily functioning is improved (Progressing)     Start:  12/19/22    Expected End:  07/20/23         LTG: Increase coping skills to manage depression and improve ability to perform daily activities (Progressing)     Start:  12/19/22    Expected End:  07/20/23         LTG: Shirley Greene will score less than 10 on the Patient Health Questionnaire (PHQ-9)  (Progressing)     Start:  12/19/22    Expected End:  07/20/23         STG: Shirley Greene will participate in at least 80% of scheduled individual psychotherapy sessions  (Progressing)     Start:  12/19/22    Expected End:  07/20/23         Work with Shirley Greene to track symptoms, triggers, and/or skill use through a mood chart, diary card, or journal (Completed)     Start:  12/19/22    End:  04/16/23    Intervention Note     Reviewed with patient during session.          Encourage Shirley Greene to participate in recovery peer support activities weekly  (Completed)     Start:  12/19/22    End:  04/16/23      Therapist will administer the PHQ-9 at weekly intervals  for the next 8 weeks (Completed)     Start:  12/19/22    End:  04/16/23      Provide Shirley Greene educational information and reading material on dissociation, its causes, and symptoms (Completed)     Start:  12/19/22    End:  07/03/23      Shirley Greene will identify 3 personal goals for managing depression symptoms to work on during the current treatment episode (Completed)     Start:  12/19/22    End:  07/03/23         ProgressTowards Goals: Progressing  Interventions: CBT, Motivational Interviewing, and Supportive   Suicidal/Homicidal: Nowithout intent/plan  Therapist Response:    Pt was alert and oriented x 5. She was pleasant, cooperative and maintained good eye contact. Shirley Greene engaged well I therapy session and was dress casually she presented with anxious mood and affect.   Pt reports struggling with motivations AEB stating I want to spend most days in bed. Shirley Greene has poor bounries and balance within Shirley Greene lifestyle. Prioritizing others over herself most days. Most of Shirley Greene days are spent caring/catering to Shirley Greene children. Pt has little to no self care or personal time. Pt says even when Shirley Greene fiance is home the kids come to Shirley Greene for help even though she knows that Shirley Greene fiance will do it the kids know she will do it faster. Pt states that Shirley Greene youngest child who is almost 66 years old has been having increasing crying outburst which pt notes is in part to the fact he know he will get what he wants if he does it.   Intervenitons/Plan: LCSW educated pt on setting healthy boundries. LCSW used supportive therapy for praise and encouragment. LCSW used edution to teach pt about behavioral modicifcation techniques for pasotive reinforment, negative reinforment, and punishment. LCSW educated pt on the use of hobbies to help decrease depression and anxiety     Plan: Return again in 4 weeks.  Diagnosis: Postpartum depression  GAD (generalized anxiety disorder)  Collaboration of Care: Other none today    Patient/Guardian was advised Release of Information must be obtained prior to any record  release in order to collaborate their care with an outside provider. Patient/Guardian was advised if they have not already done so to contact the registration department to sign all necessary forms in order for us  to release information regarding their care.   Consent: Patient/Guardian gives verbal consent for treatment and assignment of benefits for services provided during this visit. Patient/Guardian expressed understanding and agreed to proceed.   Maryagnes Small, LCSW 07/03/2023

## 2023-07-25 NOTE — Progress Notes (Unsigned)
 Patient did not connect for virtual psychiatric medication management appointment on 07/26/23 at 11:30AM. Sent secure video link with no response. Left VM with callback number to reschedule.  LAURAINE DELENA PUMMEL, MD 07/26/23

## 2023-07-26 ENCOUNTER — Encounter (HOSPITAL_COMMUNITY): Admitting: Psychiatry

## 2023-07-26 ENCOUNTER — Encounter (HOSPITAL_COMMUNITY): Payer: Self-pay

## 2023-07-30 ENCOUNTER — Encounter (HOSPITAL_COMMUNITY): Payer: Self-pay

## 2023-07-30 ENCOUNTER — Ambulatory Visit (HOSPITAL_COMMUNITY): Admitting: Licensed Clinical Social Worker

## 2023-08-13 ENCOUNTER — Ambulatory Visit (INDEPENDENT_AMBULATORY_CARE_PROVIDER_SITE_OTHER): Admitting: Licensed Clinical Social Worker

## 2023-08-13 DIAGNOSIS — F321 Major depressive disorder, single episode, moderate: Secondary | ICD-10-CM

## 2023-08-13 DIAGNOSIS — F53 Postpartum depression: Secondary | ICD-10-CM | POA: Diagnosis not present

## 2023-08-13 NOTE — Progress Notes (Unsigned)
 THERAPIST PROGRESS NOTE   Virtual Visit via Video Note  I connected with Shirley Greene on 08/14/23 at  4:00 PM EDT by a video enabled telemedicine application and verified that I am speaking with the correct person using two identifiers.  Location: Patient: Shirley Greene  Provider: Providers Home    I discussed the limitations of evaluation and management by telemedicine and the availability of in person appointments. The patient expressed understanding and agreed to proceed.     I discussed the assessment and treatment plan with the patient. The patient was provided an opportunity to ask questions and all were answered. The patient agreed with the plan and demonstrated an understanding of the instructions.   The patient was advised to call back or seek an in-person evaluation if the symptoms worsen or if the condition fails to improve as anticipated.  I provided 30 minutes of non-face-to-face time during this encounter.   Shirley GORMAN Patee, LCSW  Participation Level: Active  Behavioral Response: CasualAlertAnxious and Depressed  Type of Therapy: Individual Therapy  Treatment Goals addressed:  Active     Anxiety     STG: Shaniece will complete at least 80% of assigned homework  (Progressing)     Start:  12/19/22    Expected End:  01/16/24         STG: Sharlot will practice problem solving skills 3 times per week for the next 4 weeks.  (Progressing)     Start:  12/19/22    Expected End:  01/16/24         Create 3 coping skills for anxiety (Progressing)     Start:  12/19/22    Expected End:  01/16/24         Notify 3 triggers for anxiety (Progressing)     Start:  12/19/22    Expected End:  01/16/24         Encourage Alvera to take psychotropic medication(s) as prescribed     Start:  12/19/22       Intervention Note     Reviewed with patient during session.          Review results of GAD-7 with Shirley Greene to track progress     Start:  12/19/22        Intervention Note     Reviewed with patient during session.          Work with Shirley Greene to track symptoms, triggers, and/or skill use through a mood chart, diary card, or journal (Completed)     Start:  12/19/22    End:  04/16/23    Intervention Note     Reviewed with patient during session.          Discuss risks and benefits of medication treatment options for this problem and prescribe as indicated (Completed)     Start:  12/19/22    End:  04/16/23    Intervention Note     Reviewed with patient during session.          Perform psychoeducation regarding anxiety disorders (Completed)     Start:  12/19/22    End:  08/14/23    Intervention note from Counselor 02/12/2023 by Joclynn Lumb S, LCSW     Reviewed with patient during session.            OP Depression     LTG: Reduce frequency, intensity, and duration of depression symptoms so that daily functioning is improved (Progressing)     Start:  12/19/22  Expected End:  01/16/24         LTG: Increase coping skills to manage depression and improve ability to perform daily activities (Progressing)     Start:  12/19/22    Expected End:  01/16/24         LTG: Shirley Greene will score less than 10 on the Patient Health Questionnaire (PHQ-9)  (Progressing)     Start:  12/19/22    Expected End:  01/16/24         STG: Shirley Greene will participate in at least 80% of scheduled individual psychotherapy sessions  (Not Progressing)     Start:  12/19/22    Expected End:  01/16/24         Work with Shirley Greene to track symptoms, triggers, and/or skill use through a mood chart, diary card, or journal (Completed)     Start:  12/19/22    End:  04/16/23    Intervention Note     Reviewed with patient during session.          Encourage Shirley Greene to participate in recovery peer support activities weekly  (Completed)     Start:  12/19/22    End:  04/16/23      Therapist will administer the PHQ-9 at weekly intervals for the next 8  weeks (Completed)     Start:  12/19/22    End:  04/16/23      Provide Shirley Greene educational information and reading material on dissociation, its causes, and symptoms (Completed)     Start:  12/19/22    End:  07/03/23      Shirley Greene will identify 3 personal goals for managing depression symptoms to work on during the current treatment episode (Completed)     Start:  12/19/22    End:  07/03/23         ProgressTowards Goals: Progressing  Interventions: CBT, Motivational Interviewing, and Supportive  Summary: Shirley Greene is a 28 y.o. female who presents with depressed and anxious mood\affect.  Patient was pleasant, cooperative, maintained good eye contact.  She engaged well in therapy session was dressed casually.  Nakaila endorses symptoms for tension, worry, irritability, and restlessness.  Patient reports primary stressors as family conflict with her father and caregiving for her children.  [place] have been stressful on patient due to the lack of activities that her children are engaged in.  Patient reports that Where she is hopeful that she can get them in more activities that are affordable but this year it was just not available to her or her significant other from a financial standpoint.  Other stressors for patient include family conflict with her father.  Patient would like to have a good relationship with her father but notes that he is more of a provider than a caregiver.  Patient states that she has had to set boundaries with her father and acknowledged the fact that he will not validate her feelings of criticism towards him as a caregiver growing up.   Suicidal/Homicidal: Nowithout intent/plan  Therapist Response:     Intervention/plan: LCSW utilized psychoanalytic therapy for patient to express thoughts, feelings and emotions and nonjudgmental environment.  LCSW educated patient on setting healthy boundaries.  LCSW educated patient on symptoms of anxiety.  LCSW utilized  supportive therapy for praise and encouragement.  LCSW utilized person centered therapy for advocacy and empowerment.    Plan: Return again in 4 weeks.  Diagnosis: Current moderate episode of major depressive disorder without prior episode (HCC)  Postpartum depression  Collaboration of Care: Other None today   Patient/Guardian was advised Release of Information must be obtained prior to any record release in order to collaborate their care with an outside provider. Patient/Guardian was advised if they have not already done so to contact the registration department to sign all necessary forms in order for us  to release information regarding their care.   Consent: Patient/Guardian gives verbal consent for treatment and assignment of benefits for services provided during this visit. Patient/Guardian expressed understanding and agreed to proceed.   Shirley GORMAN Patee, LCSW 08/14/2023

## 2023-08-21 ENCOUNTER — Ambulatory Visit: Admitting: Orthopedic Surgery

## 2023-08-23 ENCOUNTER — Other Ambulatory Visit: Payer: Self-pay | Admitting: Family

## 2023-08-23 DIAGNOSIS — E1165 Type 2 diabetes mellitus with hyperglycemia: Secondary | ICD-10-CM

## 2023-09-24 DIAGNOSIS — D563 Thalassemia minor: Secondary | ICD-10-CM | POA: Diagnosis not present

## 2023-09-24 DIAGNOSIS — Z841 Family history of disorders of kidney and ureter: Secondary | ICD-10-CM | POA: Diagnosis not present

## 2023-09-24 DIAGNOSIS — E119 Type 2 diabetes mellitus without complications: Secondary | ICD-10-CM | POA: Diagnosis not present

## 2023-09-25 ENCOUNTER — Encounter: Payer: Self-pay | Admitting: Nephrology

## 2023-09-25 ENCOUNTER — Other Ambulatory Visit: Payer: Self-pay | Admitting: Nephrology

## 2023-09-25 DIAGNOSIS — Z841 Family history of disorders of kidney and ureter: Secondary | ICD-10-CM

## 2023-09-26 ENCOUNTER — Ambulatory Visit
Admission: RE | Admit: 2023-09-26 | Discharge: 2023-09-26 | Disposition: A | Discharge status: Home / Self Care | Source: Ambulatory Visit | Attending: Nephrology | Admitting: Nephrology

## 2023-09-26 DIAGNOSIS — Z841 Family history of disorders of kidney and ureter: Secondary | ICD-10-CM

## 2023-10-18 ENCOUNTER — Ambulatory Visit (HOSPITAL_COMMUNITY): Admitting: Licensed Clinical Social Worker

## 2023-10-18 ENCOUNTER — Encounter (HOSPITAL_COMMUNITY): Payer: Self-pay

## 2023-11-19 ENCOUNTER — Encounter: Payer: Self-pay | Admitting: Radiology

## 2024-01-15 ENCOUNTER — Emergency Department (HOSPITAL_COMMUNITY)
Admission: EM | Admit: 2024-01-15 | Discharge: 2024-01-15 | Discharge status: Against Medical Advice | Attending: Emergency Medicine | Admitting: Emergency Medicine

## 2024-01-15 ENCOUNTER — Encounter: Payer: Self-pay | Admitting: Emergency Medicine

## 2024-01-15 ENCOUNTER — Emergency Department (HOSPITAL_COMMUNITY)

## 2024-01-15 ENCOUNTER — Ambulatory Visit
Admission: EM | Admit: 2024-01-15 | Discharge: 2024-01-15 | Disposition: A | Discharge status: Other Acute Inpatient Hospital

## 2024-01-15 ENCOUNTER — Encounter (HOSPITAL_COMMUNITY): Payer: Self-pay

## 2024-01-15 ENCOUNTER — Other Ambulatory Visit: Payer: Self-pay

## 2024-01-15 ENCOUNTER — Ambulatory Visit: Payer: Self-pay

## 2024-01-15 DIAGNOSIS — R198 Other specified symptoms and signs involving the digestive system and abdomen: Secondary | ICD-10-CM

## 2024-01-15 DIAGNOSIS — Z5321 Procedure and treatment not carried out due to patient leaving prior to being seen by health care provider: Secondary | ICD-10-CM | POA: Diagnosis not present

## 2024-01-15 DIAGNOSIS — R1031 Right lower quadrant pain: Secondary | ICD-10-CM | POA: Diagnosis not present

## 2024-01-15 DIAGNOSIS — R110 Nausea: Secondary | ICD-10-CM | POA: Diagnosis not present

## 2024-01-15 DIAGNOSIS — R079 Chest pain, unspecified: Secondary | ICD-10-CM

## 2024-01-15 DIAGNOSIS — K5900 Constipation, unspecified: Secondary | ICD-10-CM | POA: Diagnosis not present

## 2024-01-15 DIAGNOSIS — R1033 Periumbilical pain: Secondary | ICD-10-CM | POA: Diagnosis present

## 2024-01-15 LAB — URINALYSIS, ROUTINE W REFLEX MICROSCOPIC
Bilirubin Urine: NEGATIVE
Glucose, UA: NEGATIVE mg/dL
Hgb urine dipstick: NEGATIVE
Ketones, ur: 20 mg/dL — AB
Leukocytes,Ua: NEGATIVE
Nitrite: NEGATIVE
Protein, ur: NEGATIVE mg/dL
Specific Gravity, Urine: 1.021 (ref 1.005–1.030)
pH: 6 (ref 5.0–8.0)

## 2024-01-15 LAB — LIPASE, BLOOD: Lipase: 22 U/L (ref 11–51)

## 2024-01-15 LAB — CBC
HCT: 44.2 % (ref 36.0–46.0)
Hemoglobin: 14.2 g/dL (ref 12.0–15.0)
MCH: 24.7 pg — ABNORMAL LOW (ref 26.0–34.0)
MCHC: 32.1 g/dL (ref 30.0–36.0)
MCV: 77 fL — ABNORMAL LOW (ref 80.0–100.0)
Platelets: 337 K/uL (ref 150–400)
RBC: 5.74 MIL/uL — ABNORMAL HIGH (ref 3.87–5.11)
RDW: 16 % — ABNORMAL HIGH (ref 11.5–15.5)
WBC: 8.9 K/uL (ref 4.0–10.5)
nRBC: 0 % (ref 0.0–0.2)

## 2024-01-15 LAB — HEPATIC FUNCTION PANEL
ALT: 11 U/L (ref 0–44)
AST: 17 U/L (ref 15–41)
Albumin: 4.2 g/dL (ref 3.5–5.0)
Alkaline Phosphatase: 66 U/L (ref 38–126)
Bilirubin, Direct: 0.2 mg/dL (ref 0.0–0.2)
Indirect Bilirubin: 0.3 mg/dL (ref 0.3–0.9)
Total Bilirubin: 0.5 mg/dL (ref 0.0–1.2)
Total Protein: 7.4 g/dL (ref 6.5–8.1)

## 2024-01-15 LAB — TROPONIN T, HIGH SENSITIVITY: Troponin T High Sensitivity: <15 ng/L (ref 0–19)

## 2024-01-15 NOTE — ED Triage Notes (Signed)
 Pt presents c/o center abd pain x 7 days. Pt states,  I have abd pain and constipation for about a week. My BM's are loose but not much.  Pt denies emesis. Last BM this morning.

## 2024-01-15 NOTE — ED Provider Triage Note (Signed)
 Emergency Medicine Provider Triage Evaluation Note  Shirley Greene Duty , a 28 y.o. female  was evaluated in triage.  Pt complains of periumbilical abdominal pain with nausea and constipation.  Reports that she is only able to have small bowel movements.  Still passing gas.  Having nausea but no vomiting.  Denies any urinary or vaginal symptoms.  Additionally, she reports that she has been having chest pain intermittently for the past month is left-sided.  Reports that last around 7 seconds and happens multiple times per day.  No shortness of breath or any recent cough or cold symptoms.  Was sent over here from urgent care.  Review of Systems  Positive:  Negative:   Physical Exam  BP 126/84   Pulse 84   Temp 98.1 F (36.7 C)   Resp 18   LMP  (LMP Unknown)   SpO2 100%  Gen:   Awake, no distress   Resp:  Normal effort  MSK:   Moves extremities without difficulty  Other:  Diffuse abdominal pain is palpation.  Patient does not appear in any acute distress and is on phone appearing comfortable.  Medical Decision Making  Medically screening exam initiated at 2:22 PM.  Appropriate orders placed.  Shirley Greene Dickie was informed that the remainder of the evaluation will be completed by another provider, this initial triage assessment does not replace that evaluation, and the importance of remaining in the ED until their evaluation is complete.  CT and labs ordered. VSS.    Bernis Ernst, [place] 01/15/24 1423

## 2024-01-15 NOTE — Discharge Instructions (Signed)
 You have been experiencing chest pain for one month with radiation into your left shoulder.  Additionally, you have had abdominal pain for one week with changes in your bowel movements.  On exam, your abdomen is tender (particularly in the right lower quadrant).  This will require additional evaluation beyond the capability of the Urgent Care.  Please proceed to the Emergency Department for ongoing care.

## 2024-01-15 NOTE — ED Triage Notes (Signed)
 PT arrives via POV from UC. Pt c/o abdominal pain and nausea for the past week. She reports constant left sided chest pain for the past month. Pt arrives AxOx4.

## 2024-01-15 NOTE — ED Provider Notes (Signed)
 " [facility]    CSN: [id number] Arrival date & time: 01/15/24  1035      History   Chief Complaint Chief Complaint  Patient presents with   Abdominal Pain    HPI Shirley Greene is a 28 y.o. female.   1.  Patient reports a one week history of abdominal pain and changes to her bowel movements.  States she normally goes several times per day and then went about three days without going.  She took some Miralax  which did help her have a bowel movement but having loose stools about once daily now.  There is some mucus noted in the stool but no blood.  No reported fever or vomiting - does have some nausea.  Has been eating normally.  Denies any abnormal vaginal discharge or dysuria.  History of hysterectomy. 2. Also reports some left sided chest pain that radiates into her left shoulder.  States that it is sharp in nature.  Happens every day, almost constantly.  No shortness of breath at rest but does endorse some increased work of breathing with walking up stairs. 3.  No recent febrile illness or URI symptoms.  The history is provided by the patient.  Abdominal Pain Associated symptoms: chest pain, constipation, nausea and shortness of breath   Associated symptoms: no chills, no cough, no diarrhea, no dysuria, no fatigue, no fever, no sore throat and no vaginal discharge     Past Medical History:  Diagnosis Date   Alpha thalassemia silent carrier 05/27/2018   Anemia    Cervical dysplasia 02/15/2017   02/2018: pap neg   Chlamydia    GDM (gestational diabetes mellitus), class A1 07/11/2021   Infection    UTI   Postpartum depression    Sciatica 07/16/2018    Patient Active Problem List   Diagnosis Date Noted   GAD (generalized anxiety disorder) 07/03/2023   Everitt Curt disease (radial styloid tenosynovitis) 03/23/2023   Postpartum depression    Current moderate episode of major depressive disorder without prior episode (HCC) 12/19/2022   History of hysterectomy  leaving cervix intact 01/04/2022   Anemia affecting pregnancy, antepartum 11/14/2021   ASCUS with positive high risk HPV cervical on 04/05/2021 04/10/2021   Alpha thalassemia silent carrier 05/27/2018    Past Surgical History:  Procedure Laterality Date   CESAREAN SECTION N/A 04/05/2012   Procedure: CESAREAN SECTION;  Surgeon: Norleen LULLA Server, MD;  Location: [facility];  Service: Obstetrics;  Laterality: N/A;   CESAREAN SECTION WITH BILATERAL TUBAL LIGATION N/A 11/28/2018   Procedure: cesarean section;  Surgeon: Lorence Ozell CROME, MD;  Location: [facility];  Service: Obstetrics;  Laterality: N/A;   COLPOSCOPY  11/28/2022   DILATION AND EVACUATION N/A 11/19/2020   Procedure: DILATATION AND EVACUATION;  Surgeon: Ozan, Jennifer, DO;  Location: [facility];  Service: Gynecology;  Laterality: N/A;   MINOR RELEASE DORSAL COMPARTMENT (DEQUERVAINS) Right 03/23/2023   Procedure: RIGHT WRIST FIRST EXTENSOR COMPARTMENT RELEASE  (DEQUERVAINS);  Surgeon: Arlinda Buster, MD;  Location: [facility];  Service: Orthopedics;  Laterality: Right;  RNFA   OPERATIVE ULTRASOUND N/A 11/19/2020   Procedure: OPERATIVE ULTRASOUND;  Surgeon: Marilynn Nest, DO;  Location: [facility];  Service: Gynecology;  Laterality: N/A;    OB History     Gravida  4   Para  2   Term  2   Preterm      AB  1   Living  2      SAB  IAB  1   Ectopic      Multiple  0   Live Births  2        Obstetric Comments  OP presentation, got stuck at 9cm          Home Medications    Prior to Admission medications  Medication Sig Start Date End Date Taking? Authorizing Provider  FLUoxetine  (PROZAC ) 20 MG capsule Take 1 capsule (20 mg total) by mouth daily. 06/01/23 08/30/23  Bahraini, Sarah A  metFORMIN  (GLUCOPHAGE -XR) 500 MG 24 hr tablet TAKE 1 TABLET(500 MG) BY MOUTH DAILY WITH BREAKFAST 08/23/23   [name], NP  ondansetron  (ZOFRAN -ODT) 4 MG disintegrating tablet Take 1 tablet (4 mg total) by mouth every 8  (eight) hours as needed for nausea or vomiting. 03/12/23   Raspet, Rocky K, PA-C  oxyCODONE  (ROXICODONE ) 5 MG immediate release tablet Take 1 tablet (5 mg total) by mouth every 6 (six) hours as needed for severe pain (pain score 7-10). Patient not taking: Reported on 04/06/2023 03/23/23   Arlinda Buster, MD    Family History Family History  Problem Relation Age of Onset   Hypertension Mother    Hypertension Father    Cancer Maternal Grandfather     Social History Social History[1]   Allergies   Patient has no known allergies.   Review of Systems Review of Systems  Constitutional:  Negative for chills, fatigue and fever.  HENT:  Negative for congestion, rhinorrhea and sore throat.   Eyes:  Negative for pain and redness.  Respiratory:  Positive for shortness of breath. Negative for cough and chest tightness.   Cardiovascular:  Positive for chest pain.  Gastrointestinal:  Positive for abdominal pain, constipation and nausea. Negative for blood in stool and diarrhea.  Genitourinary:  Negative for dysuria, urgency and vaginal discharge.  Musculoskeletal:  Negative for back pain and myalgias.  Neurological:  Negative for dizziness and headaches.  Psychiatric/Behavioral:  Negative for sleep disturbance.      Physical Exam Triage Vital Signs ED Triage Vitals  Encounter Vitals Group     BP 01/15/24 1124 108/72     Girls Systolic BP Percentile --      Girls Diastolic BP Percentile --      Boys Systolic BP Percentile --      Boys Diastolic BP Percentile --      Pulse Rate 01/15/24 1124 95     Resp 01/15/24 1124 18     Temp 01/15/24 1124 98.5 F (36.9 C)     Temp Source 01/15/24 1124 Oral     SpO2 01/15/24 1124 98 %     Weight 01/15/24 1123 193 lb 2 oz (87.6 kg)     Height --      Head Circumference --      Peak Flow --      Pain Score 01/15/24 1122 7     Pain Loc --      Pain Education --      Exclude from Growth Chart --    No data found.  Updated Vital Signs BP  108/72 (BP Location: Left Arm)   Pulse 95   Temp 98.5 F (36.9 C) (Oral)   Resp 18   Wt 193 lb 2 oz (87.6 kg)   LMP  (LMP Unknown)   SpO2 98%   BMI 36.49 kg/m   Physical Exam Vitals and nursing note reviewed.  Constitutional:      Appearance: Normal appearance.  HENT:  Head: Normocephalic.  Cardiovascular:     Rate and Rhythm: Normal rate and regular rhythm.     Heart sounds: Normal heart sounds. No murmur heard. Pulmonary:     Effort: Pulmonary effort is normal.     Breath sounds: Normal breath sounds. No wheezing, rhonchi or rales.  Abdominal:     General: Bowel sounds are normal.     Tenderness: There is abdominal tenderness (Diffuse tenderness, worse in the RLQ).  Skin:    General: Skin is warm and dry.  Neurological:     General: No focal deficit present.     Mental Status: She is alert and oriented to person, place, and time.  Psychiatric:        Mood and Affect: Mood normal.        Behavior: Behavior normal.        Thought Content: Thought content normal.        Judgment: Judgment normal.    UC Treatments / Results  Labs (all labs ordered are listed, but only abnormal results are displayed) Labs Reviewed - No data to display  EKG   Radiology No results found.  Procedures Procedures (including critical care time)  Medications Ordered in UC Medications - No data to display  Initial Impression / Assessment and Plan / UC Course  I have reviewed the triage vital signs and the nursing notes.  Pertinent labs & imaging results that were available during my care of the patient were reviewed by me and considered in my medical decision making (see chart for details).    Patient presented requesting evaluation for a one week history of diffuse abdominal pain and changes to her bowel movements.  Also endorsing nausea but no vomiting.  She mentions a month long history of constant chest pain with radiation into her left shoulder.  No recent febrile illness, no  cough or shortness of breath.  On exam, she is tender in her abdomen diffusely - worse in her right lower quadrant.  She will benefit from additional evaluation which unfortunately extends beyond the capability of the Urgent Care.  Through shared decision making, she is agreeable to present at the Emergency Department for ongoing care.  She is stable for transport via private vehicle.  Final Clinical Impressions(s) / UC Diagnoses   Final diagnoses:  Right lower quadrant abdominal pain  Change in bowel movement  Chest pain, unspecified type     Discharge Instructions      You have been experiencing chest pain for one month with radiation into your left shoulder.  Additionally, you have had abdominal pain for one week with changes in your bowel movements.  On exam, your abdomen is tender (particularly in the right lower quadrant).  This will require additional evaluation beyond the capability of the Urgent Care.  Please proceed to the Emergency Department for ongoing care.     ED Prescriptions   None    PDMP not reviewed this encounter.    [1]  Social History Tobacco Use   Smoking status: Never    Passive exposure: Never   Smokeless tobacco: Never  Vaping Use   Vaping status: Never Used  Substance Use Topics   Alcohol use: Yes    Comment: every few months   Drug use: No     Janet Therisa PARAS, [name] 01/15/24 1203  "

## 2024-01-15 NOTE — ED Notes (Signed)
 Patient is being discharged from the Urgent Care and sent to the Emergency Department via POV . Per AW, patient is in need of higher level of care due to abd pain RLQ. Patient is aware and verbalizes understanding of plan of care.  Vitals:   01/15/24 1124  BP: 108/72  Pulse: 95  Resp: 18  Temp: 98.5 F (36.9 C)  SpO2: 98%

## 2024-01-15 NOTE — ED Notes (Signed)
 Pt reports leaving due to long wait times. NAD

## 2024-01-15 NOTE — Telephone Encounter (Signed)
 FYI Only or Action Required?: FYI only for provider: UC advised.  Patient was last seen in primary care on 05/11/2023 by Shirley Greig PARAS, NP.  Called Nurse Triage reporting Abdominal Pain.  Symptoms began a week ago.  Interventions attempted: OTC medications: miralax .  Symptoms are: unchanged.  Triage Disposition: See HCP Within 4 Hours (Or PCP Triage)  Patient/caregiver understands and will follow disposition?: Yes    Copied from CRM [id number]. Topic: Clinical - Red Word Triage >> Jan 15, 2024  9:34 AM Jeoffrey H wrote: [facility] that prompted transfer to Nurse Triage: Patient c/o severe abdomen pain for a few days now along with constipation. Tried OTC with no help. Reason for Disposition  [1] MILD-MODERATE pain AND [2] constant AND [3] present > 2 hours  Answer Assessment - Initial Assessment Questions Pt states she has been having upper abdominal pain across whole top but mostly in the middle for about a week. She states she has been constipated. She states she has a been able to go just not like normal. Pain is 7/10. She has been taking a capful of miralax  daily with no help. Due to provider schedules, RN recommended UC for evaluation. Pt stated understanding.  Pt denies any higher acuity questions.      1. LOCATION: Where does it hurt?      Upper abdomen 2. ONSET: When did the pain begin? (e.g., minutes, hours or days ago)      About a week  3. PATTERN Does the pain come and go, or is it constant?     States been pretty constant. 4. SEVERITY: How bad is the pain?  (e.g., Scale 1-10; mild, moderate, or severe)     7/10 5. CAUSE: What do you think is causing the stomach pain? (e.g., gallstones, recent abdominal surgery)     Pt thinks constipation 6. OTHER SYMPTOMS: Do you have any other symptoms? (e.g., back pain, diarrhea, fever, urination pain, vomiting)       denies  Protocols used: Abdominal Pain - Female-A-AH

## 2024-01-16 NOTE — Telephone Encounter (Signed)
 noted

## 2024-02-05 ENCOUNTER — Ambulatory Visit: Admission: RE | Admit: 2024-02-05 | Discharge: 2024-02-05 | Disposition: A | Discharge status: Home / Self Care

## 2024-02-05 ENCOUNTER — Other Ambulatory Visit: Payer: Self-pay

## 2024-02-05 VITALS — BP 117/74 | HR 88 | Temp 98.6°F | Resp 18

## 2024-02-05 DIAGNOSIS — J069 Acute upper respiratory infection, unspecified: Secondary | ICD-10-CM

## 2024-02-05 LAB — POCT INFLUENZA A/B
Influenza A, POC: NEGATIVE
Influenza B, POC: NEGATIVE

## 2024-02-05 LAB — POC SOFIA SARS ANTIGEN FIA: SARS Coronavirus 2 Ag: NEGATIVE

## 2024-02-05 NOTE — Discharge Instructions (Signed)
 Neg for covid and flu.  You been diagnosed with a viral illness today. -Viruses have to run their course and medicines that are prescribed are meant to help with symptoms. - With viruses usually feel poorly from 3 to 7 days with cough being the last symptoms to resolve.  -Cough can linger from days to weeks.  Antibiotics are not effective for viruses. -If your cough lasts more than 2 weeks and you are coughing so hard that you are vomiting or feel like you could pass out we need to follow-up with PCP for further testing and evaluation. -Rest, increase water intake, may use pseudoephedrine for nasal congestion, Delsym (dextromethorphan) or honey as needed for cough, and ibuprofen  and/or Tylenol  as directed on packaging for pain and fever. -If you have hypertension you should take Coricidin or other OTC meds approved for people with high blood pressure. -You may use a spoonful of honey every 4-6 hours as needed for throat pain and cough. -Warm tea with honey and lemon are helpful for soothe throat as well.  Chloraseptic and Cepacol make a throat lozenge with numbing medication, can be purchased over-the-counter. -May also use Flonase  or sinus rinse for sinus pressure or nasal congestion.  Be sure to use distilled bottled water for sinus rinses. -May use coolmist humidifier to open up nasal passages -May elevate head to assist with postnasal drainage. -If you feel poorly (fever, fatigue, shortness of breath, nausea, etc.) for more than 10 days to be sure to follow-up with PCP or in clinic for further evaluation and additional treatments. If you experience chest pain with shortness of breath or pulse oxygen less than 95% you should report to the ER.

## 2024-02-05 NOTE — ED Triage Notes (Signed)
 Pt here for nasal congestion and scratchy throat x 3 days; pt sts some body aches

## 2024-02-06 NOTE — ED Provider Notes (Signed)
 " [facility]    CSN: [id number] Arrival date & time: 02/05/24  1842      History   Chief Complaint Chief Complaint  Patient presents with   Nasal Congestion    Sore throat, cough, and runny nose. - Entered by patient    HPI Shirley Greene is a 29 y.o. female.   Pt presents today due to 3 days worth of throat pain, nasal congestion, cough, body aches, and fatigue. Pt states that she has been finding relief with OTC meds. Pt denies fever or significant change in appetite.     Past Medical History:  Diagnosis Date   Alpha thalassemia silent carrier 05/27/2018   Anemia    Cervical dysplasia 02/15/2017   02/2018: pap neg   Chlamydia    GDM (gestational diabetes mellitus), class A1 07/11/2021   Infection    UTI   Postpartum depression    Sciatica 07/16/2018    Patient Active Problem List   Diagnosis Date Noted   GAD (generalized anxiety disorder) 07/03/2023   Everitt Curt disease (radial styloid tenosynovitis) 03/23/2023   Postpartum depression    Current moderate episode of major depressive disorder without prior episode (HCC) 12/19/2022   History of hysterectomy leaving cervix intact 01/04/2022   Anemia affecting pregnancy, antepartum 11/14/2021   ASCUS with positive high risk HPV cervical on 04/05/2021 04/10/2021   Alpha thalassemia silent carrier 05/27/2018    Past Surgical History:  Procedure Laterality Date   CESAREAN SECTION N/A 04/05/2012   Procedure: CESAREAN SECTION;  Surgeon: Norleen LULLA Server, MD;  Location: [facility];  Service: Obstetrics;  Laterality: N/A;   CESAREAN SECTION WITH BILATERAL TUBAL LIGATION N/A 11/28/2018   Procedure: cesarean section;  Surgeon: Lorence Ozell CROME, MD;  Location: [facility];  Service: Obstetrics;  Laterality: N/A;   COLPOSCOPY  11/28/2022   DILATION AND EVACUATION N/A 11/19/2020   Procedure: DILATATION AND EVACUATION;  Surgeon: Ozan, Jennifer, DO;  Location: [facility];  Service: Gynecology;  Laterality: N/A;   MINOR  RELEASE DORSAL COMPARTMENT (DEQUERVAINS) Right 03/23/2023   Procedure: RIGHT WRIST FIRST EXTENSOR COMPARTMENT RELEASE  (DEQUERVAINS);  Surgeon: Arlinda Buster, MD;  Location: [facility];  Service: Orthopedics;  Laterality: Right;  RNFA   OPERATIVE ULTRASOUND N/A 11/19/2020   Procedure: OPERATIVE ULTRASOUND;  Surgeon: Marilynn Nest, DO;  Location: [facility];  Service: Gynecology;  Laterality: N/A;    OB History     Gravida  4   Para  2   Term  2   Preterm      AB  1   Living  2      SAB      IAB  1   Ectopic      Multiple  0   Live Births  2        Obstetric Comments  OP presentation, got stuck at 9cm          Home Medications    Prior to Admission medications  Medication Sig Start Date End Date Taking? Authorizing Provider  FLUoxetine  (PROZAC ) 20 MG capsule Take 1 capsule (20 mg total) by mouth daily. 06/01/23 08/30/23  Bahraini, Sarah A  metFORMIN  (GLUCOPHAGE -XR) 500 MG 24 hr tablet TAKE 1 TABLET(500 MG) BY MOUTH DAILY WITH BREAKFAST 08/23/23   [name], NP  ondansetron  (ZOFRAN -ODT) 4 MG disintegrating tablet Take 1 tablet (4 mg total) by mouth every 8 (eight) hours as needed for nausea or vomiting. 03/12/23   Raspet, Rocky POUR, PA-C  oxyCODONE  (ROXICODONE ) 5 MG immediate release tablet Take 1 tablet (5 mg total) by mouth every 6 (six) hours as needed for severe pain (pain score 7-10). Patient not taking: Reported on 04/06/2023 03/23/23   Arlinda Buster, MD    Family History Family History  Problem Relation Age of Onset   Hypertension Mother    Hypertension Father    Cancer Maternal Grandfather     Social History Social History[1]   Allergies   Patient has no known allergies.   Review of Systems Review of Systems   Physical Exam Triage Vital Signs ED Triage Vitals  Encounter Vitals Group     BP 02/05/24 1902 117/74     Girls Systolic BP Percentile --      Girls Diastolic BP Percentile --      Boys Systolic BP Percentile --       Boys Diastolic BP Percentile --      Pulse Rate 02/05/24 1902 88     Resp 02/05/24 1902 18     Temp 02/05/24 1902 98.6 F (37 C)     Temp Source 02/05/24 1902 Oral     SpO2 02/05/24 1902 95 %     Weight --      Height --      Head Circumference --      Peak Flow --      Pain Score 02/05/24 1903 3     Pain Loc --      Pain Education --      Exclude from Growth Chart --    No data found.  Updated Vital Signs BP 117/74 (BP Location: Right Arm)   Pulse 88   Temp 98.6 F (37 C) (Oral)   Resp 18   LMP  (LMP Unknown)   SpO2 95%   Visual Acuity Right Eye Distance:   Left Eye Distance:   Bilateral Distance:    Right Eye Near:   Left Eye Near:    Bilateral Near:     Physical Exam Vitals and nursing note reviewed.  Constitutional:      General: She is not in acute distress.    Appearance: Normal appearance. She is not ill-appearing, toxic-appearing or diaphoretic.  HENT:     Nose: Congestion (moderately enlarged turbinates) present. No rhinorrhea.     Mouth/Throat:     Mouth: Mucous membranes are moist.     Pharynx: Oropharynx is clear. No oropharyngeal exudate or posterior oropharyngeal erythema.  Eyes:     General: No scleral icterus. Cardiovascular:     Rate and Rhythm: Normal rate and regular rhythm.     Heart sounds: Normal heart sounds.  Pulmonary:     Effort: Pulmonary effort is normal. No respiratory distress.     Breath sounds: Normal breath sounds. No wheezing or rhonchi.  Skin:    General: Skin is warm.  Neurological:     Mental Status: She is alert and oriented to person, place, and time.  Psychiatric:        Mood and Affect: Mood normal.        Behavior: Behavior normal.      UC Treatments / Results  Labs (all labs ordered are listed, but only abnormal results are displayed) Labs Reviewed  POCT INFLUENZA A/B - Normal  POC SOFIA SARS ANTIGEN FIA    EKG   Radiology No results found.  Procedures Procedures (including critical care  time)  Medications Ordered in UC Medications - No data to display  Initial Impression /  Assessment and Plan / UC Course  I have reviewed the triage vital signs and the nursing notes.  Pertinent labs & imaging results that were available during my care of the patient were reviewed by me and considered in my medical decision making (see chart for details).      Final Clinical Impressions(s) / UC Diagnoses   Final diagnoses:  Viral URI     Discharge Instructions      Neg for covid and flu  You been diagnosed with a viral illness today. -Viruses have to run their course and medicines that are prescribed are meant to help with symptoms. - With viruses usually feel poorly from 3 to 7 days with cough being the last symptoms to resolve.  -Cough can linger from days to weeks.  Antibiotics are not effective for viruses. -If your cough lasts more than 2 weeks and you are coughing so hard that you are vomiting or feel like you could pass out we need to follow-up with PCP for further testing and evaluation. -Rest, increase water  intake, may use pseudoephedrine for nasal congestion, Delsym (dextromethorphan) or honey as needed for cough, and ibuprofen  and/or Tylenol  as directed on packaging for pain and fever. -If you have hypertension you should take Coricidin or other OTC meds approved for people with high blood pressure. -You may use a spoonful of honey every 4-6 hours as needed for throat pain and cough. -Warm tea with honey and lemon are helpful for soothe throat as well.  Chloraseptic and Cepacol make a throat lozenge with numbing medication, can be purchased over-the-counter. -May also use Flonase or sinus rinse for sinus pressure or nasal congestion.  Be sure to use distilled bottled water  for sinus rinses. -May use coolmist humidifier to open up nasal passages -May elevate head to assist with postnasal drainage. -If you feel poorly (fever, fatigue, shortness of breath, nausea, etc.)  for more than 10 days to be sure to follow-up with PCP or in clinic for further evaluation and additional treatments. If you experience chest pain with shortness of breath or pulse oxygen less than 95% you should report to the ER.     ED Prescriptions   None    PDMP not reviewed this encounter.    [1]  Social History Tobacco Use   Smoking status: Never    Passive exposure: Never   Smokeless tobacco: Never  Vaping Use   Vaping status: Never Used  Substance Use Topics   Alcohol use: Yes    Comment: every few months   Drug use: No     Andra Corean BROCKS, PA-C 02/06/24 9262  "

## 2024-02-11 ENCOUNTER — Telehealth: Admitting: Physician Assistant

## 2024-02-11 DIAGNOSIS — B9689 Other specified bacterial agents as the cause of diseases classified elsewhere: Secondary | ICD-10-CM

## 2024-02-11 DIAGNOSIS — J208 Acute bronchitis due to other specified organisms: Secondary | ICD-10-CM | POA: Diagnosis not present

## 2024-02-11 MED ORDER — IPRATROPIUM BROMIDE 0.03 % NA SOLN: 2.0000 | Freq: Two times a day (BID) | NASAL | 0 refills | Status: AC

## 2024-02-11 MED ORDER — PROMETHAZINE-DM 6.25-15 MG/5ML PO SYRP: 5.0000 mL | ORAL_SOLUTION | Freq: Four times a day (QID) | ORAL | 0 refills | Status: AC | PRN

## 2024-02-11 MED ORDER — AMOXICILLIN-POT CLAVULANATE 875-125 MG PO TABS: 1.0000 | ORAL_TABLET | Freq: Two times a day (BID) | ORAL | 0 refills | Status: AC

## 2024-02-11 NOTE — Patient Instructions (Signed)
 " Shirley MALVA Duty, thank you for joining [name], PA-C for today's virtual visit.  While this provider is not your primary care provider (PCP), if your PCP is located in our provider database this encounter information will be shared with them immediately following your visit.   A [facility] MyChart account gives you access to today's visit and all your visits, tests, and labs performed at [facility]  click here if you don't have a  MyChart account or go to [email or web address]  Consent: (Patient) Shirley Greene provided verbal consent for this virtual visit at the beginning of the encounter.  Current Medications:  Current Outpatient Medications:    FLUoxetine  (PROZAC ) 20 MG capsule, Take 1 capsule (20 mg total) by mouth daily., Disp: 30 capsule, Rfl: 2   metFORMIN  (GLUCOPHAGE -XR) 500 MG 24 hr tablet, TAKE 1 TABLET(500 MG) BY MOUTH DAILY WITH BREAKFAST, Disp: 90 tablet, Rfl: 0   ondansetron  (ZOFRAN -ODT) 4 MG disintegrating tablet, Take 1 tablet (4 mg total) by mouth every 8 (eight) hours as needed for nausea or vomiting., Disp: 20 tablet, Rfl: 0   oxyCODONE  (ROXICODONE ) 5 MG immediate release tablet, Take 1 tablet (5 mg total) by mouth every 6 (six) hours as needed for severe pain (pain score 7-10). (Patient not taking: Reported on 04/06/2023), Disp: 20 tablet, Rfl: 0   Medications ordered in this encounter:  No orders of the defined types were placed in this encounter.    *If you need refills on other medications prior to your next appointment, please contact your pharmacy*  Follow-Up: Call back or seek an in-person evaluation if the symptoms worsen or if the condition fails to improve as anticipated.  [facility] [phone]  Other Instructions Take antibiotic (Augmentin ) as directed.  Increase fluids.  Get plenty of rest. Use Mucinex for congestion. Take the other prescribed medications as directed. Take a daily probiotic (I  recommend Align or Culturelle, but even Activia Yogurt may be beneficial).  A humidifier placed in the bedroom may offer some relief for a dry, scratchy throat of nasal irritation.  Read information below on acute bronchitis. Please call or return to clinic if symptoms are not improving.  Acute Bronchitis Bronchitis is when the airways that extend from the windpipe into the lungs get red, puffy, and painful (inflamed). Bronchitis often causes thick spit (mucus) to develop. This leads to a cough. A cough is the most common symptom of bronchitis. In acute bronchitis, the condition usually begins suddenly and goes away over time (usually in 2 weeks). Smoking, allergies, and asthma can make bronchitis worse. Repeated episodes of bronchitis may cause more lung problems.  HOME CARE Rest. Drink enough fluids to keep your pee (urine) clear or pale yellow (unless you need to limit fluids as told by your doctor). Only take over-the-counter or prescription medicines as told by your doctor. Avoid smoking and secondhand smoke. These can make bronchitis worse. If you are a smoker, think about using nicotine gum or skin patches. Quitting smoking will help your lungs heal faster. Reduce the chance of getting bronchitis again by: Washing your hands often. Avoiding people with cold symptoms. Trying not to touch your hands to your mouth, nose, or eyes. Follow up with your doctor as told.  GET HELP IF: Your symptoms do not improve after 1 week of treatment. Symptoms include: Cough. Fever. Coughing up thick spit. Body aches. Chest congestion. Chills. Shortness of breath. Sore throat.  GET HELP RIGHT AWAY IF:  You have an increased fever. You have chills. You have severe shortness of breath. You have bloody thick spit (sputum). You throw up (vomit) often. You lose too much body fluid (dehydration). You have a severe headache. You faint.  MAKE SURE YOU:  Understand these instructions. Will watch  your condition. Will get help right away if you are not doing well or get worse. Document Released: 06/21/2007 Document Revised: 09/04/2012 Document Reviewed: 06/25/2012 [facility] Patient Information 2015 [place], [place]. This information is not intended to replace advice given to you by your health care provider. Make sure you discuss any questions you have with your health care provider.    If you have been instructed to have an in-person evaluation today at a local Urgent Care facility, please use the link below. It will take you to a list of all of our available [facility], including address, phone number and hours of operation. Please do not delay care.  [facility]  If you or a family member do not have a primary care provider, use the link below to schedule a visit and establish care. When you choose a [facility] primary care physician or advanced practice provider, you gain a long-term partner in health. Find a Primary Care Provider  Learn more about [facility]'s in-office and virtual care options: [facility] - Get Care Now  "

## 2024-02-11 NOTE — Progress Notes (Signed)
 " Virtual Visit Consent   Shirley Greene, you are scheduled for a virtual visit with a [facility] provider today. Just as with appointments in the office, your consent must be obtained to participate. Your consent will be active for this visit and any virtual visit you may have with one of our providers in the next 365 days. If you have a MyChart account, a copy of this consent can be sent to you electronically.  As this is a virtual visit, video technology does not allow for your provider to perform a traditional examination. This may limit your provider's ability to fully assess your condition. If your provider identifies any concerns that need to be evaluated in person or the need to arrange testing (such as labs, EKG, etc.), we will make arrangements to do so. Although advances in technology are sophisticated, we cannot ensure that it will always work on either your end or our end. If the connection with a video visit is poor, the visit may have to be switched to a telephone visit. With either a video or telephone visit, we are not always able to ensure that we have a secure connection.  By engaging in this virtual visit, you consent to the provision of healthcare and authorize for your insurance to be billed (if applicable) for the services provided during this visit. Depending on your insurance coverage, you may receive a charge related to this service.  I need to obtain your verbal consent now. Are you willing to proceed with your visit today? Shirley Greene has provided verbal consent on 02/11/2024 for a virtual visit (video or telephone). [name], [place]  Date: 02/11/2024 3:05 PM   Virtual Visit via Video Note   I, [name], connected with  STEFANI BAIK  ([id number], [birth year]) on 02/11/24 at  3:00 PM EST by a video-enabled telemedicine application and verified that I am speaking with the correct person using two identifiers.  Location: Patient: Virtual Visit  Location Patient: Home Provider: Virtual Visit Location Provider: Home Office   I discussed the limitations of evaluation and management by telemedicine and the availability of in person appointments. The patient expressed understanding and agreed to proceed.    History of Present Illness: Shirley Greene is a 29 y.o. who identifies as a female who was assigned female at birth, and is being seen today for 1.5 weeks or so of worsening URI symptoms. Notes initially with nasal congestion, chest congestion, cough. Went to UC last week and tested negative for flu/COVID. Was instructed to start supportive measures.  Notes progressive symptoms since then with chest congestion, now productive cough and chest wall tenderness. Notes substantial nasal congestion with sinus pressure, headache and pain. Unsure of fever but at times feels flushed. Denies recent travel or known sick contact. Taking Mucinex OTC.  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  Resting comfortably at home.  Head is normocephalic, atraumatic.  No labored breathing.  Speech is clear and coherent with logical content.  Patient is alert and oriented at baseline.   Assessment and Plan: 1. Acute bacterial bronchitis (Primary) - promethazine -dextromethorphan (PROMETHAZINE -DM) 6.25-15 MG/5ML  syrup; Take 5 mLs by mouth 4 (four) times daily as needed for cough.  Dispense: 118 mL; Refill: 0 - ipratropium (ATROVENT ) 0.03 % nasal spray; Place 2 sprays into both nostrils every 12 (twelve) hours.  Dispense: 30 mL; Refill: 0 - amoxicillin -clavulanate (AUGMENTIN ) 875-125 MG tablet; Take 1 tablet by mouth 2 (two) times daily.  Dispense: 14 tablet; Refill: 0  With sinusitis. Rx Augmentin .  Increase fluids.  Rest.  Saline nasal spray.  Probiotic.  Mucinex as directed.  Humidifier in bedroom. Atrovent  spray and Promethazine -DM per orders.  Call or return to clinic if symptoms are not improving.   Follow Up Instructions: I discussed the assessment and treatment plan with the patient. The patient was provided an opportunity to ask questions and all were answered. The patient agreed with the plan and demonstrated an understanding of the instructions.  A copy of instructions were sent to the patient via MyChart unless otherwise noted below.   The patient was advised to call back or seek an in-person evaluation if the symptoms worsen or if the condition fails to improve as anticipated.    [name], PA-C    [1] No Known Allergies [2]  Current Outpatient Medications:    amoxicillin -clavulanate (AUGMENTIN ) 875-125 MG tablet, Take 1 tablet by mouth 2 (two) times daily., Disp: 14 tablet, Rfl: 0   ipratropium (ATROVENT ) 0.03 % nasal spray, Place 2 sprays into both nostrils every 12 (twelve) hours., Disp: 30 mL, Rfl: 0   promethazine -dextromethorphan (PROMETHAZINE -DM) 6.25-15 MG/5ML syrup, Take 5 mLs by mouth 4 (four) times daily as needed for cough., Disp: 118 mL, Rfl: 0  "

## 2024-02-12 ENCOUNTER — Ambulatory Visit

## 2024-02-14 ENCOUNTER — Ambulatory Visit

## 2024-02-17 ENCOUNTER — Telehealth: Admitting: Family Medicine

## 2024-02-17 ENCOUNTER — Encounter: Payer: Self-pay | Admitting: Family

## 2024-02-17 DIAGNOSIS — J208 Acute bronchitis due to other specified organisms: Secondary | ICD-10-CM

## 2024-02-17 DIAGNOSIS — B9689 Other specified bacterial agents as the cause of diseases classified elsewhere: Secondary | ICD-10-CM | POA: Diagnosis not present

## 2024-02-17 MED ORDER — AMOXICILLIN-POT CLAVULANATE 875-125 MG PO TABS: 1.0000 | ORAL_TABLET | Freq: Two times a day (BID) | ORAL | 0 refills | Status: AC

## 2024-02-17 NOTE — Patient Instructions (Signed)
 Acute Bronchitis, Adult  Acute bronchitis is when air tubes in the lungs (bronchi) suddenly get swollen. The condition can make it hard for you to breathe. In adults, acute bronchitis usually goes away within 2 weeks. A cough caused by bronchitis may last up to 3 weeks. Smoking, allergies, and asthma can make the condition worse. What are the causes? Germs that cause cold and flu (viruses). The most common cause of this condition is the virus that causes the common cold. Bacteria. Substances that bother (irritate) the lungs, including: Smoke from cigarettes and other types of tobacco. Dust and pollen. Fumes from chemicals, gases, or burned fuel. Indoor or outdoor air pollution. What increases the risk? A weak body's defense system. This is also called the immune system. Any condition that affects your lungs and breathing, such as asthma. What are the signs or symptoms? A cough. Coughing up clear, yellow, or green mucus. Making high-pitched whistling sounds when you breathe, most often when you breathe out (wheezing). Runny or stuffy nose. Having too much mucus in your lungs (chest congestion). Shortness of breath. Body aches. A sore throat. How is this treated? Acute bronchitis may go away over time without treatment. Your doctor may tell you to: Drink more fluids. This will help thin your mucus so it is easier to cough up. Use a device that gets medicine into your lungs (inhaler). Use a vaporizer or a humidifier. These are machines that add water to the air. This helps with coughing and poor breathing. Take a medicine that thins mucus and helps clear it from your lungs. Take a medicine that prevents or stops coughing. It is not common to take an antibiotic medicine for this condition. Follow these instructions at home:  Take over-the-counter and prescription medicines only as told by your doctor. Use an inhaler, vaporizer, or humidifier as told by your doctor. Take two teaspoons  (10 mL) of honey at bedtime. This helps lessen your coughing at night. Drink enough fluid to keep your pee (urine) pale yellow. Do not smoke or use any products that contain nicotine or tobacco. If you need help quitting, ask your doctor. Get a lot of rest. Return to your normal activities when your doctor says that it is safe. Keep all follow-up visits. How is this prevented?  Wash your hands often with soap and water for at least 20 seconds. If you cannot use soap and water, use hand sanitizer. Avoid contact with people who have cold symptoms. Try not to touch your mouth, nose, or eyes with your hands. Avoid breathing in smoke or chemical fumes. Make sure to get the flu shot every year. Contact a doctor if: Your symptoms do not get better in 2 weeks. You have trouble coughing up the mucus. Your cough keeps you awake at night. You have a fever. Get help right away if: You cough up blood. You have chest pain. You have very bad shortness of breath. You faint or keep feeling like you are going to faint. You have a very bad headache. Your fever or chills get worse. These symptoms may be an emergency. Get help right away. Call your local emergency services (911 in the [place] Do not wait to see if the symptoms will go away. Do not drive yourself to the hospital. Summary Acute bronchitis is when air tubes in the lungs (bronchi) suddenly get swollen. In adults, acute bronchitis usually goes away within 2 weeks. Drink more fluids. This will help thin your mucus so it is easier  to cough up. Take over-the-counter and prescription medicines only as told by your doctor. Contact a doctor if your symptoms do not improve after 2 weeks of treatment. This information is not intended to replace advice given to you by your health care provider. Make sure you discuss any questions you have with your health care provider. Document Revised: 05/05/2020 Document Reviewed: 05/05/2020 Elsevier Patient  Education  2024 [employer].

## 2024-02-17 NOTE — Progress Notes (Signed)
 " Virtual Visit Consent   Shirley Greene, you are scheduled for a virtual visit with a [facility] provider today. Just as with appointments in the office, your consent must be obtained to participate. Your consent will be active for this visit and any virtual visit you may have with one of our providers in the next 365 days. If you have a MyChart account, a copy of this consent can be sent to you electronically.  As this is a virtual visit, video technology does not allow for your provider to perform a traditional examination. This may limit your provider's ability to fully assess your condition. If your provider identifies any concerns that need to be evaluated in person or the need to arrange testing (such as labs, EKG, etc.), we will make arrangements to do so. Although advances in technology are sophisticated, we cannot ensure that it will always work on either your end or our end. If the connection with a video visit is poor, the visit may have to be switched to a telephone visit. With either a video or telephone visit, we are not always able to ensure that we have a secure connection.  By engaging in this virtual visit, you consent to the provision of healthcare and authorize for your insurance to be billed (if applicable) for the services provided during this visit. Depending on your insurance coverage, you may receive a charge related to this service.  I need to obtain your verbal consent now. Are you willing to proceed with your visit today? Shirley Greene has provided verbal consent on 02/17/2024 for a virtual visit (video or telephone). [name], [name]  Date: 02/17/2024 2:26 PM   Virtual Visit via Video Note   I, [name], connected with  Shirley Greene  ([id number], [birth year]) on 02/17/24 at  2:15 PM EST by a video-enabled telemedicine application and verified that I am speaking with the correct person using two identifiers.  Location: Patient: Virtual Visit Location Patient:  Home Provider: Virtual Visit Location Provider: Home Office   I discussed the limitations of evaluation and management by telemedicine and the availability of in person appointments. The patient expressed understanding and agreed to proceed.    History of Present Illness: Shirley Greene is a 29 y.o. who identifies as a female who was assigned female at birth, and is being seen today for cough, fever, congestion, body aches, headaches like the flu. Now she has cough. Tickle in throat, congested, throwing up. No ear pain, no wheezing or sob.She has been on augmentin  for 6 days. She is diabetic and does not check her glucose. She has prometh  dm and ipratropium for nasal congestion.   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  Resting comfortably  at home.  Head is normocephalic, atraumatic.  No labored breathing.  Speech is clear and coherent with logical content.  Patient is alert and oriented at baseline.    Assessment and  Plan: 1. Acute bacterial bronchitis  Extend augmentin  3 more days total of 10. UC if sx persist or worsen. Np prednisone  due to concerns for hyperglycemia.   Follow Up Instructions: I discussed the assessment and treatment plan with the patient. The patient was provided an opportunity to ask questions and all were answered. The  patient agreed with the plan and demonstrated an understanding of the instructions.  A copy of instructions were sent to the patient via MyChart unless otherwise noted below.     The patient was advised to call back or seek an in-person evaluation if the symptoms worsen or if the condition fails to improve as anticipated.    Sallye Lunz, [name]     [1] No Known Allergies [2]  Current Outpatient Medications:    amoxicillin -clavulanate (AUGMENTIN ) 875-125 MG tablet, Take 1 tablet by mouth 2 (two) times daily., Disp: 14 tablet, Rfl: 0   ipratropium (ATROVENT ) 0.03 % nasal spray, Place 2 sprays into both nostrils every 12 (twelve) hours., Disp: 30 mL, Rfl: 0   promethazine -dextromethorphan (PROMETHAZINE -DM) 6.25-15 MG/5ML syrup, Take 5 mLs by mouth 4 (four) times daily as needed for cough., Disp: 118 mL, Rfl: 0  "

## 2024-02-19 NOTE — Telephone Encounter (Signed)
 Report to Emergency Department/Urgent Care/call 911 for immediate medical evaluation. Follow-up with Primary Care.
